# Patient Record
Sex: Female | Born: 1970 | Race: White | Hispanic: No | Marital: Single | Smoking: Former smoker
Health system: Southern US, Community
[De-identification: ages and names within clinical notes are randomized; demographics above are authoritative.]

## PROBLEM LIST (undated history)

## (undated) DIAGNOSIS — Z87442 Personal history of urinary calculi: Secondary | ICD-10-CM

## (undated) DIAGNOSIS — I10 Essential (primary) hypertension: Secondary | ICD-10-CM

## (undated) DIAGNOSIS — E7800 Pure hypercholesterolemia, unspecified: Secondary | ICD-10-CM

## (undated) DIAGNOSIS — F419 Anxiety disorder, unspecified: Secondary | ICD-10-CM

## (undated) DIAGNOSIS — R51 Headache: Secondary | ICD-10-CM

## (undated) DIAGNOSIS — E782 Mixed hyperlipidemia: Secondary | ICD-10-CM

## (undated) DIAGNOSIS — Z950 Presence of cardiac pacemaker: Secondary | ICD-10-CM

## (undated) DIAGNOSIS — I429 Cardiomyopathy, unspecified: Secondary | ICD-10-CM

## (undated) DIAGNOSIS — J449 Chronic obstructive pulmonary disease, unspecified: Secondary | ICD-10-CM

## (undated) DIAGNOSIS — I471 Supraventricular tachycardia: Secondary | ICD-10-CM

## (undated) DIAGNOSIS — K219 Gastro-esophageal reflux disease without esophagitis: Secondary | ICD-10-CM

## (undated) DIAGNOSIS — R0600 Dyspnea, unspecified: Secondary | ICD-10-CM

## (undated) DIAGNOSIS — G4730 Sleep apnea, unspecified: Secondary | ICD-10-CM

## (undated) DIAGNOSIS — E0590 Thyrotoxicosis, unspecified without thyrotoxic crisis or storm: Secondary | ICD-10-CM

## (undated) DIAGNOSIS — I4719 Other supraventricular tachycardia: Secondary | ICD-10-CM

## (undated) DIAGNOSIS — I4891 Unspecified atrial fibrillation: Secondary | ICD-10-CM

## (undated) DIAGNOSIS — Z9889 Other specified postprocedural states: Secondary | ICD-10-CM

## (undated) DIAGNOSIS — R55 Syncope and collapse: Secondary | ICD-10-CM

## (undated) DIAGNOSIS — J189 Pneumonia, unspecified organism: Secondary | ICD-10-CM

## (undated) DIAGNOSIS — I639 Cerebral infarction, unspecified: Secondary | ICD-10-CM

## (undated) DIAGNOSIS — R519 Headache, unspecified: Secondary | ICD-10-CM

## (undated) DIAGNOSIS — C801 Malignant (primary) neoplasm, unspecified: Secondary | ICD-10-CM

## (undated) DIAGNOSIS — I499 Cardiac arrhythmia, unspecified: Secondary | ICD-10-CM

## (undated) DIAGNOSIS — I495 Sick sinus syndrome: Secondary | ICD-10-CM

## (undated) DIAGNOSIS — IMO0001 Reserved for inherently not codable concepts without codable children: Secondary | ICD-10-CM

## (undated) DIAGNOSIS — R112 Nausea with vomiting, unspecified: Secondary | ICD-10-CM

## (undated) DIAGNOSIS — E039 Hypothyroidism, unspecified: Secondary | ICD-10-CM

## (undated) HISTORY — DX: Anxiety disorder, unspecified: F41.9

## (undated) HISTORY — DX: Supraventricular tachycardia: I47.1

## (undated) HISTORY — DX: Dyspnea, unspecified: R06.00

## (undated) HISTORY — DX: Cardiomyopathy, unspecified: I42.9

## (undated) HISTORY — PX: CARDIAC CATHETERIZATION: SHX172

## (undated) HISTORY — DX: Hypothyroidism, unspecified: E03.9

## (undated) HISTORY — DX: Other supraventricular tachycardia: I47.19

## (undated) HISTORY — DX: Mixed hyperlipidemia: E78.2

---

## 2006-03-29 HISTORY — PX: PACEMAKER INSERTION: SHX728

## 2011-03-30 HISTORY — PX: TUMOR REMOVAL: SHX12

## 2012-10-29 ENCOUNTER — Emergency Department (HOSPITAL_COMMUNITY): Payer: Medicaid Other

## 2012-10-29 ENCOUNTER — Encounter (HOSPITAL_COMMUNITY): Payer: Self-pay | Admitting: *Deleted

## 2012-10-29 ENCOUNTER — Emergency Department (HOSPITAL_COMMUNITY)
Admission: EM | Admit: 2012-10-29 | Discharge: 2012-10-29 | Discharge status: Against Medical Advice | Payer: Medicaid Other | Attending: Emergency Medicine | Admitting: Emergency Medicine

## 2012-10-29 DIAGNOSIS — R519 Headache, unspecified: Secondary | ICD-10-CM

## 2012-10-29 DIAGNOSIS — Z8673 Personal history of transient ischemic attack (TIA), and cerebral infarction without residual deficits: Secondary | ICD-10-CM | POA: Insufficient documentation

## 2012-10-29 DIAGNOSIS — F17200 Nicotine dependence, unspecified, uncomplicated: Secondary | ICD-10-CM | POA: Insufficient documentation

## 2012-10-29 DIAGNOSIS — Z950 Presence of cardiac pacemaker: Secondary | ICD-10-CM | POA: Insufficient documentation

## 2012-10-29 DIAGNOSIS — R51 Headache: Secondary | ICD-10-CM | POA: Insufficient documentation

## 2012-10-29 DIAGNOSIS — Z7982 Long term (current) use of aspirin: Secondary | ICD-10-CM | POA: Insufficient documentation

## 2012-10-29 HISTORY — DX: Presence of cardiac pacemaker: Z95.0

## 2012-10-29 HISTORY — DX: Cerebral infarction, unspecified: I63.9

## 2012-10-29 NOTE — ED Notes (Signed)
Pt c/o HA for a week off and on, this morning with seeing spots, + nausea, denies emesis

## 2012-10-29 NOTE — ED Notes (Signed)
Ct arrived for transport, pt not in room, gown in bathroom and pt nowhere to be found.

## 2012-10-29 NOTE — ED Provider Notes (Signed)
CSN: [id number]     Arrival date & time 10/29/12  1924 History     First MD Initiated Contact with Patient 10/29/12 2107     Chief Complaint  Patient presents with  . Headache    HPI Pt was seen at 2115.  Per pt, c/o gradual onset and persistence of constant headache for the past 1 week, worse since last night.  Describes the headache as "dull," located on both sides of her head.  Has been associated with nausea. Denies headache was sudden or maximal in onset or at any time.  Denies visual changes, no focal motor weakness, no tingling/numbness in extremities, no fevers, no neck pain, no rash.      Past Medical History  Diagnosis Date  . Pacemaker   . Stroke    Past Surgical History  Procedure Laterality Date  . Pacemaker insertion      History  Substance Use Topics  . Smoking status: Current Every Day Smoker    Types: Cigarettes  . Smokeless tobacco: Not on file  . Alcohol Use: No    Review of Systems ROS: Statement: All systems negative except as marked or noted in the HPI; Constitutional: Negative for fever and chills. ; ; Eyes: Negative for eye pain, redness and discharge. ; ; ENMT: Negative for ear pain, hoarseness, nasal congestion, sinus pressure and sore throat. ; ; Cardiovascular: Negative for chest pain, palpitations, diaphoresis, dyspnea and peripheral edema. ; ; Respiratory: Negative for cough, wheezing and stridor. ; ; Gastrointestinal: +nausea. Negative for vomiting, diarrhea, abdominal pain, blood in stool, hematemesis, jaundice and rectal bleeding. . ; ; Genitourinary: Negative for dysuria, flank pain and hematuria. ; ; Musculoskeletal: Negative for back pain and neck pain. Negative for swelling and trauma.; ; Skin: Negative for pruritus, rash, abrasions, blisters, bruising and skin lesion.; ; Neuro: +headache. Negative for lightheadedness and neck stiffness. Negative for weakness, altered level of consciousness , altered mental status, extremity weakness,  paresthesias, involuntary movement, seizure and syncope.     Allergies  Review of patient's allergies indicates no known allergies.  Home Medications   Current Outpatient Rx  Name  Route  Sig  Dispense  Refill  . acetaminophen (TYLENOL) 500 MG tablet   Oral   Take 1,000 mg by mouth every 6 (six) hours as needed for pain.         [name] aspirin 325 MG tablet   Oral   Take 325 mg by mouth daily.          BP 140/84  Pulse 100  Temp(Src) 98.4 F (36.9 C) (Oral)  Resp 20  Ht 5\' 5"  (1.651 m)  Wt 225 lb (102.059 kg)  BMI 37.44 kg/m2  SpO2 100%  LMP 10/16/2012 Physical Exam 2120: Physical examination:  Nursing notes reviewed; Vital signs and O2 SAT reviewed;  Constitutional: Well developed, Well nourished, Well hydrated, In no acute distress; Head:  Normocephalic, atraumatic; Eyes: EOMI, PERRL, No scleral icterus; ENMT: Mouth and pharynx normal, Mucous membranes moist; Neck: Supple, Full range of motion, No lymphadenopathy. No meningeal signs;; Cardiovascular: Regular rate and rhythm, No murmur, rub, or gallop; Respiratory: Breath sounds clear & equal bilaterally, No rales, rhonchi, wheezes.  Speaking full sentences with ease, Normal respiratory effort/excursion; Chest: Nontender, Movement normal; Abdomen: Soft, Nontender, Nondistended, Normal bowel sounds; Genitourinary: No CVA tenderness; Extremities: Pulses normal, No tenderness, No edema, No calf edema or asymmetry.; Neuro: AA&Ox3, Major CN grossly intact.  Strength 5/5 equal bilat UE's and LE's.  DTR 2/4 equal  bilat UE's and LE's.  No gross sensory deficits. Climbs on and off stretcher easily by herself. Gait steady. Speech clear.  No facial droop.  No nystagmus..; Skin: Color normal, Warm, Dry.   ED Course   Procedures    MDM  MDM Reviewed: nursing note and vitals   2130:  Pt no longer in exam room. Hospital gown on bed and pt's belongings gone. Not found in ED or waiting room. AWOL.    Laray Anger, DO 10/31/12  1258

## 2013-03-29 HISTORY — PX: HYSTEROSCOPY: SHX211

## 2013-05-20 ENCOUNTER — Encounter (HOSPITAL_COMMUNITY): Payer: Self-pay | Admitting: Emergency Medicine

## 2013-05-20 ENCOUNTER — Emergency Department (HOSPITAL_COMMUNITY)
Admission: EM | Admit: 2013-05-20 | Discharge: 2013-05-21 | Disposition: A | Discharge status: Home / Self Care | Payer: Medicaid Other | Attending: Emergency Medicine | Admitting: Emergency Medicine

## 2013-05-20 ENCOUNTER — Emergency Department (HOSPITAL_COMMUNITY): Payer: Medicaid Other

## 2013-05-20 DIAGNOSIS — Z950 Presence of cardiac pacemaker: Secondary | ICD-10-CM | POA: Insufficient documentation

## 2013-05-20 DIAGNOSIS — R079 Chest pain, unspecified: Secondary | ICD-10-CM | POA: Insufficient documentation

## 2013-05-20 DIAGNOSIS — Z862 Personal history of diseases of the blood and blood-forming organs and certain disorders involving the immune mechanism: Secondary | ICD-10-CM | POA: Insufficient documentation

## 2013-05-20 DIAGNOSIS — F17200 Nicotine dependence, unspecified, uncomplicated: Secondary | ICD-10-CM | POA: Insufficient documentation

## 2013-05-20 DIAGNOSIS — Z8673 Personal history of transient ischemic attack (TIA), and cerebral infarction without residual deficits: Secondary | ICD-10-CM | POA: Insufficient documentation

## 2013-05-20 DIAGNOSIS — Z8639 Personal history of other endocrine, nutritional and metabolic disease: Secondary | ICD-10-CM | POA: Insufficient documentation

## 2013-05-20 DIAGNOSIS — Z8679 Personal history of other diseases of the circulatory system: Secondary | ICD-10-CM | POA: Insufficient documentation

## 2013-05-20 HISTORY — DX: Syncope and collapse: R55

## 2013-05-20 HISTORY — DX: Pure hypercholesterolemia, unspecified: E78.00

## 2013-05-20 HISTORY — DX: Unspecified atrial fibrillation: I48.91

## 2013-05-20 LAB — CBC WITH DIFFERENTIAL/PLATELET
BASOS ABS: 0 10*3/uL (ref 0.0–0.1)
BASOS PCT: 1 % (ref 0–1)
EOS ABS: 0.2 10*3/uL (ref 0.0–0.7)
EOS PCT: 3 % (ref 0–5)
HEMATOCRIT: 35 % — AB (ref 36.0–46.0)
Hemoglobin: 11.7 g/dL — ABNORMAL LOW (ref 12.0–15.0)
LYMPHS PCT: 41 % (ref 12–46)
Lymphs Abs: 2.6 10*3/uL (ref 0.7–4.0)
MCH: 30.4 pg (ref 26.0–34.0)
MCHC: 33.4 g/dL (ref 30.0–36.0)
MCV: 90.9 fL (ref 78.0–100.0)
MONO ABS: 0.5 10*3/uL (ref 0.1–1.0)
Monocytes Relative: 9 % (ref 3–12)
Neutro Abs: 3 10*3/uL (ref 1.7–7.7)
Neutrophils Relative %: 48 % (ref 43–77)
PLATELETS: 307 10*3/uL (ref 150–400)
RBC: 3.85 MIL/uL — ABNORMAL LOW (ref 3.87–5.11)
RDW: 15.2 % (ref 11.5–15.5)
WBC: 6.4 10*3/uL (ref 4.0–10.5)

## 2013-05-20 LAB — I-STAT TROPONIN, ED
TROPONIN I, POC: 0.01 ng/mL (ref 0.00–0.08)
Troponin i, poc: 0 ng/mL (ref 0.00–0.08)

## 2013-05-20 LAB — BASIC METABOLIC PANEL
BUN: 15 mg/dL (ref 6–23)
CALCIUM: 8.9 mg/dL (ref 8.4–10.5)
CO2: 25 meq/L (ref 19–32)
CREATININE: 0.74 mg/dL (ref 0.50–1.10)
Chloride: 105 mEq/L (ref 96–112)
GFR calc Af Amer: >90 mL/min (ref >90)
Glucose, Bld: 121 mg/dL — ABNORMAL HIGH (ref 70–99)
Potassium: 3.7 mEq/L (ref 3.7–5.3)
Sodium: 142 mEq/L (ref 137–147)

## 2013-05-20 MED ORDER — ASPIRIN 325 MG PO TABS
325.0000 mg | ORAL_TABLET | Freq: Once | ORAL | Status: AC
  Administered 2013-05-20: 325 mg via ORAL
  Filled 2013-05-20: qty 1

## 2013-05-20 MED ORDER — NITROGLYCERIN 0.4 MG SL SUBL
0.4000 mg | SUBLINGUAL_TABLET | SUBLINGUAL | Status: DC | PRN
  Administered 2013-05-20: 0.4 mg via SUBLINGUAL
  Filled 2013-05-20: qty 25

## 2013-05-20 NOTE — ED Notes (Signed)
States her chest is getting better.  Rates a 3/10.

## 2013-05-20 NOTE — ED Notes (Addendum)
Pt states she feels ok now.  Heart rate in the 90's and nsr.  Pain is the same.  Dr. Stevie Kern notified

## 2013-05-20 NOTE — ED Provider Notes (Signed)
CSN: [id number]     Arrival date & time 05/20/13  1810 History   First MD Initiated Contact with Patient 05/20/13 1822   This chart was scribed for Alison Relic, MD by Terressa Koyanagi, ED Scribe and Jenne Campus, ED Scribe. This patient was seen in room APA17/APA17 and the patient's care was started at 7:29 PM.    Chief Complaint  Patient presents with  . Chest Pain    The history is provided by the patient. No language interpreter was used.   HPI Comments: Alison Wilkinson is a 43 y.o. female who presents to the Emergency Department complaining of chest pain onset a couple of hours ago. Pt reports that she was at rest when she began to experience diffuse, mild chest pressure without radiation. Pt states she did not experience any associated SOB, confusion or syncope. Pt denies history of asthma, emphysema, or blood clots. Pt has a pacemaker placed due to syncope and later diagnosed with a-fib.   No coughing, stabbing pain. No specific activity. Onset couple of hours ago. Gradual pressure, chest discomfort. Diffused, mild chest pressure without radiation, no SOB. No confusion, syncope. Has never experienced anything like this before.   Pt is a smoker. Pt denies a history of asthma, emphasema, or blood clots. Pt. orignially had the pacemaker for syncope. Baseline history of A-fib, pacemaker and stroke. Pt. Is supposed to be on Xarelto, but has not taken it in a month due to hemorrhagia. No DM.   Past Medical History  Diagnosis Date  . Pacemaker   . Stroke   . A-fib   . Syncope   . High cholesterol    Past Surgical History  Procedure Laterality Date  . Pacemaker insertion     History reviewed. No pertinent family history. History  Substance Use Topics  . Smoking status: Current Every Day Smoker    Types: Cigarettes  . Smokeless tobacco: Not on file  . Alcohol Use: No   No OB history provided.  Review of Systems  10 Systems reviewed and are negative for acute change except as  noted in the HPI.   Allergies  Review of patient's allergies indicates no known allergies.  Home Medications   No current outpatient prescriptions on file. BP 121/70  Pulse 97  Temp(Src) 97.6 F (36.4 C) (Oral)  Resp 20  Ht 5\' 5"  (1.651 m)  Wt 220 lb (99.791 kg)  BMI 36.61 kg/m2  SpO2 100%  LMP 05/17/2013 Physical Exam  Nursing note and vitals reviewed. Constitutional: She appears well-developed and well-nourished.  Awake, alert, nontoxic appearance.  HENT:  Head: Atraumatic.  Eyes: Right eye exhibits no discharge. Left eye exhibits no discharge.  Neck: Neck supple.  Cardiovascular: Normal rate and regular rhythm.   No murmur heard. Pulmonary/Chest: Effort normal. She has no wheezes. She exhibits no tenderness.  Abdominal: Soft. There is no tenderness. There is no rebound.  Musculoskeletal: She exhibits no edema and no tenderness.  Baseline ROM, no obvious new focal weakness.  Neurological:  Mental status and motor strength appears baseline for patient and situation.  Skin: Skin is warm and dry. No rash noted.  Psychiatric: She has a normal mood and affect.    ED Course  Procedures (including critical care time) PERC negative. HEART Pathway= 2. DIAGNOSTIC STUDIES: Oxygen Saturation is 100% on RA, normal by my interpretation.    COORDINATION OF CARE: Patient understand and agree with initial ED impression and plan with expectations set for ED visit.Pt feels improved after  observation and/or treatment in ED.Patient informed of clinical course, understand medical decision-making process, and agree with plan. Labs Review Labs Reviewed  CBC WITH DIFFERENTIAL - Abnormal; Notable for the following:    RBC 3.85 (*)    Hemoglobin 11.7 (*)    HCT 35.0 (*)    All other components within normal limits  BASIC METABOLIC PANEL - Abnormal; Notable for the following:    Glucose, Bld 121 (*)    All other components within normal limits  I-STAT TROPOININ, ED  I-STAT TROPOININ,  ED    EKG Interpretation    Date/Time:  Sunday May 20 2013 18:55:25 EST Ventricular Rate:  88 PR Interval:  166 QRS Duration: 88 QT Interval:  370 QTC Calculation: 447 R Axis:   19 Text Interpretation:  Normal sinus rhythm Normal ECG No previous ECGs available Confirmed by [facility]  MD, Ota Ebersole ([id number]) on 05/20/2013 7:29:44 PM            MDM   Final diagnoses:  Chest pain   I doubt any other EMC precluding discharge at this time including, but not necessarily limited to the following:AMI, PE. Feel low risk for ACS. I personally performed the services described in this documentation, which was scribed in my presence. The recorded information has been reviewed and is accurate.      Alison Relic, MD 05/22/13 2028

## 2013-05-20 NOTE — ED Notes (Signed)
Patient complaining of chest pain and shortness of breath starting approximately 2 hours ago.

## 2013-05-20 NOTE — ED Notes (Signed)
Pt states her chest pain has improved some.  Rates a 6/10 now.  Pt no distress. nsr on monitor.

## 2013-05-20 NOTE — ED Notes (Signed)
After taking nitroglycerin pt had short period of sinus tachycardia.  Rate 160's/  Pt states her pain is about the same but she does not feel good.  Pt trying to get up out of bed.

## 2013-05-21 NOTE — ED Notes (Signed)
Pt alert & oriented x4, stable gait. Patient given discharge instructions, paperwork & prescription(s). Patient  instructed to stop at the registration desk to finish any additional paperwork. Patient verbalized understanding. Pt left department w/ no further questions. 

## 2013-05-21 NOTE — Discharge Instructions (Signed)

## 2013-05-25 LAB — HM MAMMOGRAPHY: HM MAMMO: NEGATIVE

## 2013-09-25 ENCOUNTER — Emergency Department (HOSPITAL_COMMUNITY)
Admission: EM | Admit: 2013-09-25 | Discharge: 2013-09-25 | Disposition: A | Discharge status: Home / Self Care | Payer: Medicaid Other | Attending: Emergency Medicine | Admitting: Emergency Medicine

## 2013-09-25 ENCOUNTER — Encounter (HOSPITAL_COMMUNITY): Payer: Self-pay | Admitting: Emergency Medicine

## 2013-09-25 DIAGNOSIS — Z950 Presence of cardiac pacemaker: Secondary | ICD-10-CM | POA: Insufficient documentation

## 2013-09-25 DIAGNOSIS — G43009 Migraine without aura, not intractable, without status migrainosus: Secondary | ICD-10-CM

## 2013-09-25 DIAGNOSIS — Z8679 Personal history of other diseases of the circulatory system: Secondary | ICD-10-CM | POA: Insufficient documentation

## 2013-09-25 DIAGNOSIS — Z862 Personal history of diseases of the blood and blood-forming organs and certain disorders involving the immune mechanism: Secondary | ICD-10-CM | POA: Insufficient documentation

## 2013-09-25 DIAGNOSIS — Z8673 Personal history of transient ischemic attack (TIA), and cerebral infarction without residual deficits: Secondary | ICD-10-CM | POA: Insufficient documentation

## 2013-09-25 DIAGNOSIS — M79609 Pain in unspecified limb: Secondary | ICD-10-CM | POA: Insufficient documentation

## 2013-09-25 DIAGNOSIS — Z8639 Personal history of other endocrine, nutritional and metabolic disease: Secondary | ICD-10-CM | POA: Insufficient documentation

## 2013-09-25 DIAGNOSIS — F17200 Nicotine dependence, unspecified, uncomplicated: Secondary | ICD-10-CM | POA: Insufficient documentation

## 2013-09-25 DIAGNOSIS — G43909 Migraine, unspecified, not intractable, without status migrainosus: Secondary | ICD-10-CM | POA: Insufficient documentation

## 2013-09-25 LAB — CBC WITH DIFFERENTIAL/PLATELET
Basophils Absolute: 0 10*3/uL (ref 0.0–0.1)
Basophils Relative: 0 % (ref 0–1)
Eosinophils Absolute: 0.1 10*3/uL (ref 0.0–0.7)
Eosinophils Relative: 1 % (ref 0–5)
HEMATOCRIT: 44 % (ref 36.0–46.0)
HEMOGLOBIN: 15.1 g/dL — AB (ref 12.0–15.0)
LYMPHS PCT: 38 % (ref 12–46)
Lymphs Abs: 2.7 10*3/uL (ref 0.7–4.0)
MCH: 30.8 pg (ref 26.0–34.0)
MCHC: 34.3 g/dL (ref 30.0–36.0)
MCV: 89.6 fL (ref 78.0–100.0)
MONO ABS: 0.6 10*3/uL (ref 0.1–1.0)
MONOS PCT: 9 % (ref 3–12)
NEUTROS ABS: 3.7 10*3/uL (ref 1.7–7.7)
Neutrophils Relative %: 52 % (ref 43–77)
Platelets: 285 10*3/uL (ref 150–400)
RBC: 4.91 MIL/uL (ref 3.87–5.11)
RDW: 14 % (ref 11.5–15.5)
WBC: 7.1 10*3/uL (ref 4.0–10.5)

## 2013-09-25 LAB — URINALYSIS, ROUTINE W REFLEX MICROSCOPIC
BILIRUBIN URINE: NEGATIVE
GLUCOSE, UA: NEGATIVE mg/dL
HGB URINE DIPSTICK: NEGATIVE
KETONES UR: 15 mg/dL — AB
Leukocytes, UA: NEGATIVE
Nitrite: NEGATIVE
PH: 5.5 (ref 5.0–8.0)
Protein, ur: NEGATIVE mg/dL
Specific Gravity, Urine: >1.03 — ABNORMAL HIGH (ref 1.005–1.030)
Urobilinogen, UA: 0.2 mg/dL (ref 0.0–1.0)

## 2013-09-25 LAB — BASIC METABOLIC PANEL
BUN: 15 mg/dL (ref 6–23)
CHLORIDE: 103 meq/L (ref 96–112)
CO2: 24 meq/L (ref 19–32)
CREATININE: 0.64 mg/dL (ref 0.50–1.10)
Calcium: 9.3 mg/dL (ref 8.4–10.5)
GFR calc Af Amer: >90 mL/min (ref >90)
GFR calc non Af Amer: >90 mL/min (ref >90)
Glucose, Bld: 105 mg/dL — ABNORMAL HIGH (ref 70–99)
Potassium: 3.8 mEq/L (ref 3.7–5.3)
Sodium: 141 mEq/L (ref 137–147)

## 2013-09-25 LAB — CBG MONITORING, ED: GLUCOSE-CAPILLARY: 110 mg/dL — AB (ref 70–99)

## 2013-09-25 MED ORDER — DIPHENHYDRAMINE HCL 50 MG/ML IJ SOLN
12.5000 mg | Freq: Once | INTRAMUSCULAR | Status: AC
  Administered 2013-09-25: 12.5 mg via INTRAVENOUS
  Filled 2013-09-25: qty 1

## 2013-09-25 MED ORDER — METOCLOPRAMIDE HCL 5 MG/ML IJ SOLN
10.0000 mg | Freq: Once | INTRAMUSCULAR | Status: AC
  Administered 2013-09-25: 10 mg via INTRAVENOUS
  Filled 2013-09-25: qty 2

## 2013-09-25 MED ORDER — SODIUM CHLORIDE 0.9 % IV BOLUS (SEPSIS)
1000.0000 mL | Freq: Once | INTRAVENOUS | Status: AC
  Administered 2013-09-25: 1000 mL via INTRAVENOUS

## 2013-09-25 MED ORDER — BUTALBITAL-APAP-CAFFEINE 50-325-40 MG PO TABS: 1.0000 | ORAL_TABLET | Freq: Four times a day (QID) | ORAL | Status: AC | PRN

## 2013-09-25 MED ORDER — KETOROLAC TROMETHAMINE 30 MG/ML IJ SOLN
30.0000 mg | Freq: Once | INTRAMUSCULAR | Status: AC
  Administered 2013-09-25: 30 mg via INTRAVENOUS
  Filled 2013-09-25: qty 1

## 2013-09-25 MED ORDER — LORAZEPAM 2 MG/ML IJ SOLN
1.0000 mg | Freq: Once | INTRAMUSCULAR | Status: AC
  Administered 2013-09-25: 1 mg via INTRAVENOUS
  Filled 2013-09-25: qty 1

## 2013-09-25 NOTE — ED Notes (Signed)
Intermittent headaches times 3 days, with nausea and blurred vision. Denies vomiting.

## 2013-09-25 NOTE — ED Notes (Signed)
Pt given ice water; per EDP no swallow screen needed.

## 2013-09-25 NOTE — ED Provider Notes (Signed)
TIME SEEN: 4:55 PM  CHIEF COMPLAINT: Headaches, nausea, right eye blurry vision, right arm and leg pain  HPI: Patient is a 43 year old female with history of atrial fibrillation who is supposed to be taking Xarelto but states she's not taking his medication because she is concerned about side effects, prior stroke, hyperlipidemia, pacemaker, migraine headaches who presents to the emergency department with diffuse, throbbing headache with associated nausea that is similar to her prior migraines. She states she's been taking Excedrin at home without relief. She states she was concerned however he decided to come to the emergency department because she is also having a aching pain in her right arm and leg with no aggravating or relieving factors and blurry vision in her right eye. She describes seeing spots in her right eye intermittently. She denies any pain. No eye trauma. Her vision is normal in the left eye. She does not wear contacts or glasses. She denies any numbness or focal weakness. No neck or back pain. No bowel or bladder incontinence. No chest pain or shortness of breath. No fevers, chills. No cough, vomiting or diarrhea. No bloody stools or melena. She states she feels weak all over and "I just don't feel like myself". She states that she does not feel that she is having another stroke.  ROS: See HPI Constitutional: no fever  Eyes: no drainage  ENT: no runny nose   Cardiovascular:  no chest pain  Resp: no SOB  GI: no vomiting GU: no dysuria Integumentary: no rash  Allergy: no hives  Musculoskeletal: no leg swelling  Neurological: no slurred speech ROS otherwise negative  PAST MEDICAL HISTORY/PAST SURGICAL HISTORY:  Past Medical History  Diagnosis Date  . Pacemaker   . Stroke   . A-fib   . Syncope   . High cholesterol     MEDICATIONS:  Prior to Admission medications   Not on File    ALLERGIES:  No Known Allergies  SOCIAL HISTORY:  History  Substance Use Topics  .  Smoking status: Current Every Day Smoker    Types: Cigarettes  . Smokeless tobacco: Not on file  . Alcohol Use: No    FAMILY HISTORY: History reviewed. No pertinent family history.  EXAM: BP 137/81  Pulse 125  Temp(Src) 98.2 F (36.8 C) (Oral)  Resp 18  Ht 5\' 4"  (1.626 m)  Wt 240 lb (108.863 kg)  BMI 41.18 kg/m2  SpO2 100% CONSTITUTIONAL: Alert and oriented and responds appropriately to questions. Well-appearing; well-nourished, nontoxic, well-appearing man in distress HEAD: Normocephalic EYES: Conjunctivae clear, PERRL, extraocular movements intact and painless, no eye discharge, no periorbital ecchymosis or swelling, no pain with consensual light response, no photophobia, no hyphema or hypopyon; right eye vision 20/40, left eye vision 20/30 ENT: normal nose; no rhinorrhea; moist mucous membranes; pharynx without lesions noted NECK: Supple, no meningismus, no LAD; no midline spinal tenderness or step-off or deformity CARD: RRR; S1 and S2 appreciated; no murmurs, no clicks, no rubs, no gallops RESP: Normal chest excursion without splinting or tachypnea; breath sounds clear and equal bilaterally; no wheezes, no rhonchi, no rales,  ABD/GI: Normal bowel sounds; non-distended; soft, non-tender, no rebound, no guarding BACK:  The back appears normal and is non-tender to palpation, there is no CVA tenderness; no midline spinal tenderness or step-off or deformity EXT: Patient reports she is mildly tender to palpation when I palpate her right upper extremity and right lower extremity diffusely with no obvious bony deformity or signs of septic arthritis, no erythema or  warmth or induration, no lesions, 2+ radial and DP pulses bilaterally, Normal ROM in all joints; otherwise extremities are non-tender to palpation; no edema; normal capillary refill; no cyanosis    SKIN: Normal color for age and race; warm, no rash  NEURO: Moves all extremities equally; strength 5/5 in all 4 extremities, sensation  to light touch intact diffusely, cranial nerves II through XII intact, patient does have some blurry vision and difficulty with telling me how many fingers I am holding up only in her right eye in the medial lower quadrant but no vision loss, no dysmetria to finger-nose testing, normal gait, 2+ deep tendon reflexes in bilateral upper and lower extremity, no clonus PSYCH: The patient's mood and manner are appropriate. Grooming and personal hygiene are appropriate.  MEDICAL DECISION MAKING: Patient here with diffuse headache that she describes as feeling similar to her prior migraines with associated nausea. She is also complaining of right arm and leg pain that is new for her denies any injury. No new numbness or weakness. She is also complaining of some right eye blurry vision that has been intermittent for the past 3 days. No sudden onset headache, thunderclap headache, worst headache of her life. No infectious symptoms. No history of head injury. She is neurologically intact other than having some blurry vision in her right eye in the right medial lower corner and. No diplopia. No vision loss. She is otherwise well-appearing. Symptoms may be secondary to her migraine. Doubt stroke given this is unilateral. No vision loss to suggest retinal artery occlusion. No history of eye trauma or eye pain to suggest abrasion, foreign body, globe injury. Possible retinal detachment but atypical given symptoms are intermittent. We'll obtain basic labs, urine, CBG, EKG. We'll give Toradol, Reglan, Benadryl and IV fluids and reassess. I do not feel head CT is indicated at this time.   Patient began feeling like she was going to have a panic attack. She was given 1 mg of IV Ativan.  ED PROGRESS: Patient's heart rate has improved with medications. She states her headache is almost completely resolved as has her arm, leg pain and blurry vision. Patient feels that she could manage her symptoms at home. She does not want any  further medications in the ED. I feel her symptoms are all likely related to a migraine headache. Her labs here have been unremarkable. Have discussed with patient that if her symptoms continue, recommend she followup with a neurologist. We'll discharge him with prescription for Fioricet to take as needed. Have discussed strict return precautions and supportive care instructions. Patient verbalizes understanding and is comfortable with this plan.     EKG Interpretation  Date/Time:  Tuesday September 25 2013 17:24:11 EDT Ventricular Rate:  100 PR Interval:  126 QRS Duration: 86 QT Interval:  342 QTC Calculation: 441 R Axis:   75 Text Interpretation:  Sinus tachycardia Confirmed by WARD,  DO, KRISTEN [phone]) on 09/25/2013 5:39:31 PM        [place], DO 09/25/13 1821

## 2013-09-25 NOTE — ED Notes (Signed)
Pt reports "it's like my vision is blocked in my right eye in one section." Pt reports taking excedrin migraine with no pain relief. Dr. Leonides Schanz at bedside.

## 2013-09-25 NOTE — Discharge Instructions (Signed)

## 2014-03-24 ENCOUNTER — Emergency Department (HOSPITAL_COMMUNITY)
Admission: EM | Admit: 2014-03-24 | Discharge: 2014-03-24 | Disposition: A | Discharge status: Home / Self Care | Payer: Medicaid Other | Attending: Emergency Medicine | Admitting: Emergency Medicine

## 2014-03-24 ENCOUNTER — Encounter (HOSPITAL_COMMUNITY): Payer: Self-pay | Admitting: Emergency Medicine

## 2014-03-24 ENCOUNTER — Emergency Department (HOSPITAL_COMMUNITY): Payer: Medicaid Other

## 2014-03-24 DIAGNOSIS — Z8639 Personal history of other endocrine, nutritional and metabolic disease: Secondary | ICD-10-CM | POA: Insufficient documentation

## 2014-03-24 DIAGNOSIS — R531 Weakness: Secondary | ICD-10-CM | POA: Diagnosis not present

## 2014-03-24 DIAGNOSIS — Z720 Tobacco use: Secondary | ICD-10-CM | POA: Diagnosis not present

## 2014-03-24 DIAGNOSIS — I4891 Unspecified atrial fibrillation: Secondary | ICD-10-CM | POA: Insufficient documentation

## 2014-03-24 DIAGNOSIS — Z950 Presence of cardiac pacemaker: Secondary | ICD-10-CM | POA: Diagnosis not present

## 2014-03-24 DIAGNOSIS — Z8673 Personal history of transient ischemic attack (TIA), and cerebral infarction without residual deficits: Secondary | ICD-10-CM | POA: Insufficient documentation

## 2014-03-24 DIAGNOSIS — R52 Pain, unspecified: Secondary | ICD-10-CM

## 2014-03-24 DIAGNOSIS — R51 Headache: Secondary | ICD-10-CM | POA: Insufficient documentation

## 2014-03-24 DIAGNOSIS — R5383 Other fatigue: Secondary | ICD-10-CM | POA: Diagnosis present

## 2014-03-24 LAB — COMPREHENSIVE METABOLIC PANEL
ALT: 27 U/L (ref 0–35)
AST: 23 U/L (ref 0–37)
Albumin: 3.6 g/dL (ref 3.5–5.2)
Alkaline Phosphatase: 72 U/L (ref 39–117)
Anion gap: 4 — ABNORMAL LOW (ref 5–15)
BUN: 16 mg/dL (ref 6–23)
CALCIUM: 9.5 mg/dL (ref 8.4–10.5)
CO2: 26 mmol/L (ref 19–32)
Chloride: 105 mEq/L (ref 96–112)
Creatinine, Ser: 0.55 mg/dL (ref 0.50–1.10)
GLUCOSE: 101 mg/dL — AB (ref 70–99)
Potassium: 4.5 mmol/L (ref 3.5–5.1)
Sodium: 135 mmol/L (ref 135–145)
TOTAL PROTEIN: 6.8 g/dL (ref 6.0–8.3)
Total Bilirubin: 0.2 mg/dL — ABNORMAL LOW (ref 0.3–1.2)

## 2014-03-24 LAB — URINALYSIS, ROUTINE W REFLEX MICROSCOPIC
Bilirubin Urine: NEGATIVE
Glucose, UA: NEGATIVE mg/dL
Hgb urine dipstick: NEGATIVE
Ketones, ur: NEGATIVE mg/dL
LEUKOCYTES UA: NEGATIVE
Nitrite: NEGATIVE
Protein, ur: NEGATIVE mg/dL
SPECIFIC GRAVITY, URINE: 1.02 (ref 1.005–1.030)
UROBILINOGEN UA: 0.2 mg/dL (ref 0.0–1.0)
pH: 6.5 (ref 5.0–8.0)

## 2014-03-24 LAB — CBC WITH DIFFERENTIAL/PLATELET
Basophils Absolute: 0 10*3/uL (ref 0.0–0.1)
Basophils Relative: 0 % (ref 0–1)
EOS ABS: 0.1 10*3/uL (ref 0.0–0.7)
EOS PCT: 1 % (ref 0–5)
HEMATOCRIT: 43.6 % (ref 36.0–46.0)
Hemoglobin: 14.7 g/dL (ref 12.0–15.0)
LYMPHS ABS: 2 10*3/uL (ref 0.7–4.0)
LYMPHS PCT: 43 % (ref 12–46)
MCH: 29.6 pg (ref 26.0–34.0)
MCHC: 33.7 g/dL (ref 30.0–36.0)
MCV: 87.9 fL (ref 78.0–100.0)
MONO ABS: 0.4 10*3/uL (ref 0.1–1.0)
Monocytes Relative: 8 % (ref 3–12)
Neutro Abs: 2.3 10*3/uL (ref 1.7–7.7)
Neutrophils Relative %: 48 % (ref 43–77)
Platelets: 247 10*3/uL (ref 150–400)
RBC: 4.96 MIL/uL (ref 3.87–5.11)
RDW: 13.3 % (ref 11.5–15.5)
WBC: 4.8 10*3/uL (ref 4.0–10.5)

## 2014-03-24 MED ORDER — ACETAMINOPHEN 325 MG PO TABS
650.0000 mg | ORAL_TABLET | Freq: Once | ORAL | Status: AC
  Administered 2014-03-24: 650 mg via ORAL
  Filled 2014-03-24: qty 2

## 2014-03-24 NOTE — ED Notes (Signed)
PT reports extreme fatigue and muscle weakness worsening over the past 6 months and pt reports falling asleep often even sitting up x3 days. PT states she feels disoriented and her thoughts are not clear and reports a headache today.

## 2014-03-24 NOTE — ED Notes (Signed)
Called lab asking for blood work to be drawn, a fast track room is being held for this purpose.

## 2014-03-24 NOTE — Discharge Instructions (Signed)
Follow up with your md this week. °

## 2014-03-24 NOTE — ED Notes (Signed)
Patient reports fatigue, decreased vision, muscle weakness and intention tremor that is also slightly  present at rest.  States she falls asleep easily throughout day, but sleeps well w/some mild difficulty falling asleep.  States he thought processes are slowed and has to have others help keep up with things at home.  Denies chest pain, SOB, dizziness.

## 2014-03-24 NOTE — ED Notes (Signed)
Patient with no complaints at this time. Respirations even and unlabored. Skin warm/dry. Discharge instructions reviewed with patient at this time. Patient given opportunity to voice concerns/ask questions. IV removed per policy and band-aid applied to site. Patient discharged at this time and left Emergency Department with steady gait.  

## 2014-03-24 NOTE — ED Provider Notes (Signed)
CSN: [id number]     Arrival date & time 03/24/14  1641 History   First MD Initiated Contact with Patient 03/24/14 1948     Chief Complaint  Patient presents with  . Fatigue     (Consider location/radiation/quality/duration/timing/severity/associated sxs/prior Treatment) Patient is a 43 y.o. female presenting with weakness. The history is provided by the patient (the pt complains of weakness and memory problems for a few weaks).  Weakness This is a new problem. The current episode started more than 2 days ago. The problem occurs constantly. The problem has not changed since onset.Pertinent negatives include no chest pain, no abdominal pain and no headaches. Nothing aggravates the symptoms. Nothing relieves the symptoms.    Past Medical History  Diagnosis Date  . Pacemaker   . Stroke   . A-fib   . Syncope   . High cholesterol    Past Surgical History  Procedure Laterality Date  . Pacemaker insertion     No family history on file. History  Substance Use Topics  . Smoking status: Current Every Day Smoker -- 1.00 packs/day    Types: Cigarettes  . Smokeless tobacco: Not on file  . Alcohol Use: No   OB History    No data available     Review of Systems  Constitutional: Negative for appetite change and fatigue.  HENT: Negative for congestion, ear discharge and sinus pressure.   Eyes: Negative for discharge.  Respiratory: Negative for cough.   Cardiovascular: Negative for chest pain.  Gastrointestinal: Negative for abdominal pain and diarrhea.  Genitourinary: Negative for frequency and hematuria.  Musculoskeletal: Negative for back pain.  Skin: Negative for rash.  Neurological: Positive for weakness. Negative for seizures and headaches.  Psychiatric/Behavioral: Negative for hallucinations.      Allergies  Review of patient's allergies indicates no known allergies.  Home Medications   Prior to Admission medications   Medication Sig Start Date End Date Taking?  Authorizing Provider  aspirin-acetaminophen-caffeine (EXCEDRIN MIGRAINE) [phone] MG per tablet Take 2 tablets by mouth daily as needed for headache or migraine.    Historical Provider, MD  butalbital-acetaminophen-caffeine (FIORICET) 50-325-40 MG per tablet Take 1-2 tablets by mouth every 6 (six) hours as needed for headache. Patient not taking: Reported on 03/24/2014 09/25/13 09/25/14  Kristen N Ward, DO   BP 159/84 mmHg  Pulse 111  Temp(Src) 97.8 F (36.6 C) (Oral)  Resp 22  Ht 5\' 5"  (1.651 m)  Wt 210 lb (95.255 kg)  BMI 34.95 kg/m2  SpO2 100%  LMP 03/21/2014 Physical Exam  Constitutional: She is oriented to person, place, and time. She appears well-developed.  HENT:  Head: Normocephalic.  Eyes: Conjunctivae and EOM are normal. No scleral icterus.  Neck: Neck supple. No thyromegaly present.  Cardiovascular: Normal rate and regular rhythm.  Exam reveals no gallop and no friction rub.   No murmur heard. Pulmonary/Chest: No stridor. She has no wheezes. She has no rales. She exhibits no tenderness.  Abdominal: She exhibits no distension. There is no tenderness. There is no rebound.  Musculoskeletal: Normal range of motion. She exhibits no edema.  Lymphadenopathy:    She has no cervical adenopathy.  Neurological: She is oriented to person, place, and time. She exhibits normal muscle tone. Coordination normal.  Skin: No rash noted. No erythema.  Psychiatric: She has a normal mood and affect. Her behavior is normal.    ED Course  Procedures (including critical care time) Labs Review Labs Reviewed  COMPREHENSIVE METABOLIC PANEL - Abnormal; Notable for  the following:    Glucose, Bld 101 (*)    Total Bilirubin 0.2 (*)    Anion gap 4 (*)    All other components within normal limits  CBC WITH DIFFERENTIAL  URINALYSIS, ROUTINE W REFLEX MICROSCOPIC  TSH    Imaging Review Ct Head Wo Contrast  03/24/2014   CLINICAL DATA:  Fatigue and muscle weakness for 6 months. Somnolence for 3  days. Headache.  EXAM: CT HEAD WITHOUT CONTRAST  TECHNIQUE: Contiguous axial images were obtained from the base of the skull through the vertex without intravenous contrast.  COMPARISON:  Head CT scan 12/18/2012.  FINDINGS: The brain appears normal without hemorrhage, infarct, mass lesion, mass effect, midline shift or abnormal extra-axial fluid collection. No hydrocephalus or pneumocephalus. The calvarium is intact.  IMPRESSION: Negative head CT.   Electronically Signed   By: Inge Rise M.D.   On: 03/24/2014 20:28     EKG Interpretation None      MDM   Final diagnoses:  Pain  Weakness    Weakness and memory problems.   Nl studies,  Will get tsh and have pt follow up with pcp    Maudry Diego, MD 03/24/14 2116

## 2014-03-25 LAB — TSH: TSH: 0.018 u[IU]/mL — AB (ref 0.350–4.500)

## 2014-03-28 ENCOUNTER — Telehealth (HOSPITAL_BASED_OUTPATIENT_CLINIC_OR_DEPARTMENT_OTHER): Payer: Self-pay | Admitting: *Deleted

## 2014-05-23 ENCOUNTER — Telehealth: Payer: Self-pay | Admitting: Family Medicine

## 2014-05-28 DIAGNOSIS — I10 Essential (primary) hypertension: Secondary | ICD-10-CM | POA: Insufficient documentation

## 2014-06-04 NOTE — Telephone Encounter (Signed)
Left detailed message stating multiple attempts have been made to contact pt and will close encounter, if pt still needs an appt to call back.

## 2014-10-23 ENCOUNTER — Encounter (HOSPITAL_COMMUNITY): Payer: Self-pay | Admitting: Emergency Medicine

## 2014-10-23 ENCOUNTER — Emergency Department (HOSPITAL_COMMUNITY)
Admission: EM | Admit: 2014-10-23 | Discharge: 2014-10-23 | Disposition: A | Discharge status: Home / Self Care | Payer: Medicaid Other | Attending: Emergency Medicine | Admitting: Emergency Medicine

## 2014-10-23 DIAGNOSIS — Z79899 Other long term (current) drug therapy: Secondary | ICD-10-CM | POA: Insufficient documentation

## 2014-10-23 DIAGNOSIS — Z720 Tobacco use: Secondary | ICD-10-CM | POA: Insufficient documentation

## 2014-10-23 DIAGNOSIS — J3489 Other specified disorders of nose and nasal sinuses: Secondary | ICD-10-CM | POA: Diagnosis not present

## 2014-10-23 DIAGNOSIS — Z8679 Personal history of other diseases of the circulatory system: Secondary | ICD-10-CM | POA: Insufficient documentation

## 2014-10-23 DIAGNOSIS — H81399 Other peripheral vertigo, unspecified ear: Secondary | ICD-10-CM | POA: Insufficient documentation

## 2014-10-23 DIAGNOSIS — Z950 Presence of cardiac pacemaker: Secondary | ICD-10-CM | POA: Diagnosis not present

## 2014-10-23 DIAGNOSIS — Z8639 Personal history of other endocrine, nutritional and metabolic disease: Secondary | ICD-10-CM | POA: Diagnosis not present

## 2014-10-23 DIAGNOSIS — R531 Weakness: Secondary | ICD-10-CM | POA: Diagnosis present

## 2014-10-23 DIAGNOSIS — Z8673 Personal history of transient ischemic attack (TIA), and cerebral infarction without residual deficits: Secondary | ICD-10-CM | POA: Insufficient documentation

## 2014-10-23 HISTORY — DX: Headache: R51

## 2014-10-23 HISTORY — DX: Sick sinus syndrome: I49.5

## 2014-10-23 HISTORY — DX: Headache, unspecified: R51.9

## 2014-10-23 LAB — COMPREHENSIVE METABOLIC PANEL
ALT: 19 U/L (ref 14–54)
AST: 19 U/L (ref 15–41)
Albumin: 3.6 g/dL (ref 3.5–5.0)
Alkaline Phosphatase: 115 U/L (ref 38–126)
Anion gap: 4 — ABNORMAL LOW (ref 5–15)
BUN: 13 mg/dL (ref 6–20)
CHLORIDE: 108 mmol/L (ref 101–111)
CO2: 28 mmol/L (ref 22–32)
Calcium: 8.9 mg/dL (ref 8.9–10.3)
Creatinine, Ser: 0.57 mg/dL (ref 0.44–1.00)
GFR calc Af Amer: >60 mL/min (ref >60)
GLUCOSE: 128 mg/dL — AB (ref 65–99)
Potassium: 3.9 mmol/L (ref 3.5–5.1)
SODIUM: 140 mmol/L (ref 135–145)
Total Bilirubin: 0.6 mg/dL (ref 0.3–1.2)
Total Protein: 7.1 g/dL (ref 6.5–8.1)

## 2014-10-23 LAB — URINALYSIS, ROUTINE W REFLEX MICROSCOPIC
Bilirubin Urine: NEGATIVE
Glucose, UA: NEGATIVE mg/dL
HGB URINE DIPSTICK: NEGATIVE
Ketones, ur: NEGATIVE mg/dL
LEUKOCYTES UA: NEGATIVE
Nitrite: NEGATIVE
PROTEIN: NEGATIVE mg/dL
Specific Gravity, Urine: 1.015 (ref 1.005–1.030)
UROBILINOGEN UA: 1 mg/dL (ref 0.0–1.0)
pH: 7 (ref 5.0–8.0)

## 2014-10-23 LAB — CBC WITH DIFFERENTIAL/PLATELET
Basophils Absolute: 0 10*3/uL (ref 0.0–0.1)
Basophils Relative: 0 % (ref 0–1)
Eosinophils Absolute: 0.2 10*3/uL (ref 0.0–0.7)
Eosinophils Relative: 2 % (ref 0–5)
HCT: 40.2 % (ref 36.0–46.0)
Hemoglobin: 13.8 g/dL (ref 12.0–15.0)
Lymphocytes Relative: 36 % (ref 12–46)
Lymphs Abs: 2.9 10*3/uL (ref 0.7–4.0)
MCH: 30.1 pg (ref 26.0–34.0)
MCHC: 34.3 g/dL (ref 30.0–36.0)
MCV: 87.8 fL (ref 78.0–100.0)
Monocytes Absolute: 0.8 10*3/uL (ref 0.1–1.0)
Monocytes Relative: 10 % (ref 3–12)
Neutro Abs: 4.1 10*3/uL (ref 1.7–7.7)
Neutrophils Relative %: 52 % (ref 43–77)
PLATELETS: 258 10*3/uL (ref 150–400)
RBC: 4.58 MIL/uL (ref 3.87–5.11)
RDW: 13.8 % (ref 11.5–15.5)
WBC: 7.9 10*3/uL (ref 4.0–10.5)

## 2014-10-23 MED ORDER — SODIUM CHLORIDE 0.9 % IV SOLN
INTRAVENOUS | Status: DC
  Administered 2014-10-23: 21:00:00 via INTRAVENOUS

## 2014-10-23 MED ORDER — MECLIZINE HCL 25 MG PO TABS: 25.0000 mg | ORAL_TABLET | Freq: Three times a day (TID) | ORAL | Status: DC | PRN

## 2014-10-23 MED ORDER — MECLIZINE HCL 12.5 MG PO TABS
25.0000 mg | ORAL_TABLET | Freq: Once | ORAL | Status: AC
  Administered 2014-10-23: 25 mg via ORAL
  Filled 2014-10-23: qty 2

## 2014-10-23 NOTE — ED Provider Notes (Signed)
CSN: [id number]     Arrival date & time 10/23/14  1937 History   First MD Initiated Contact with Patient 10/23/14 2002     Chief Complaint  Patient presents with  . Weakness      HPI Pt was seen at 2010. Per pt, c/o sudden onset and persistence of constant "feeling like everything is moving" that began several hours ago. Has been associated with generalized weakness/fatigue. Describes her symptoms as "it feels like I'm high on drugs but I don't do drugs anymore."  Denies eye pain, no visual changes, no focal motor weakness, no tingling/numbness in extremities, no ataxia, no slurred speech, no facial droop, no CP/palpitations, no SOB/cough, no abd pain, no N/V/D, no headache.     Past Medical History  Diagnosis Date  . Pacemaker   . Stroke   . A-fib   . Syncope   . High cholesterol   . Headache   . SSS (sick sinus syndrome)    Past Surgical History  Procedure Laterality Date  . Pacemaker insertion      History  Substance Use Topics  . Smoking status: Current Every Day Smoker -- 1.00 packs/day    Types: Cigarettes  . Smokeless tobacco: Not on file  . Alcohol Use: No    Review of Systems ROS: Statement: All systems negative except as marked or noted in the HPI; Constitutional: Negative for fever and chills. +generalized weakness.; ; Eyes: Negative for eye pain, redness and discharge. ; ; ENMT: Negative for ear pain, hoarseness, nasal congestion, sinus pressure and sore throat. ; ; Cardiovascular: Negative for chest pain, palpitations, diaphoresis, dyspnea and peripheral edema. ; ; Respiratory: Negative for cough, wheezing and stridor. ; ; Gastrointestinal: Negative for nausea, vomiting, diarrhea, abdominal pain, blood in stool, hematemesis, jaundice and rectal bleeding. . ; ; Genitourinary: Negative for dysuria, flank pain and hematuria. ; ; Musculoskeletal: Negative for back pain and neck pain. Negative for swelling and trauma.; ; Skin: Negative for pruritus, rash, abrasions,  blisters, bruising and skin lesion.; ; Neuro: +"everything is moving." Negative for headache, lightheadedness and neck stiffness. Negative for altered level of consciousness , altered mental status, extremity weakness, paresthesias, involuntary movement, seizure and syncope.      Allergies  Review of patient's allergies indicates no known allergies.  Home Medications   Prior to Admission medications   Medication Sig Start Date End Date Taking? Authorizing Provider  clonazePAM (KLONOPIN) 0.5 MG tablet Take 0.5 mg by mouth 2 (two) times daily as needed for anxiety.    Yes Historical Provider, MD  ibuprofen (ADVIL,MOTRIN) 200 MG tablet Take 600 mg by mouth every 6 (six) hours as needed for mild pain or moderate pain.    Yes Historical Provider, MD  metoprolol tartrate (LOPRESSOR) 25 MG tablet Take 25 mg by mouth 2 (two) times daily. 07/29/14  Yes Historical Provider, MD  nicotine (NICODERM CQ - DOSED IN MG/24 HOURS) 14 mg/24hr patch Place 14 mg onto the skin daily.   Yes Historical Provider, MD   BP 121/51 mmHg  Pulse 83  Temp(Src) 98.7 F (37.1 C) (Oral)  Resp 18  Ht 5\' 5"  (1.651 m)  Wt 214 lb (97.07 kg)  BMI 35.61 kg/m2  SpO2 98%  LMP 10/20/2014   21:25:36 Orthostatic Vital Signs KH  Orthostatic Lying  - BP- Lying: 133/74 mmHg ; Pulse- Lying: 84  Orthostatic Sitting - BP- Sitting: 153/67 mmHg ; Pulse- Sitting: 91  Orthostatic Standing at 0 minutes - BP- Standing at 0 minutes: 155/86  mmHg ; Pulse- Standing at 0 minutes: 86      Physical Exam  2015: Physical examination:  Nursing notes reviewed; Vital signs and O2 SAT reviewed;  Constitutional: Well developed, Well nourished, Well hydrated, In no acute distress; Head:  Normocephalic, atraumatic; Eyes: EOMI, PERRL, No scleral icterus; ENMT: TM's clear bilat. +edemetous nasal turbinates bilat with clear rhinorrhea. Mouth and pharynx normal, Mucous membranes moist; Neck: Supple, Full range of motion, No lymphadenopathy; Cardiovascular:  Regular rate and rhythm, No murmur, rub, or gallop; Respiratory: Breath sounds clear & equal bilaterally, No rales, rhonchi, wheezes.  Speaking full sentences with ease, Normal respiratory effort/excursion; Chest: Nontender, Movement normal; Abdomen: Soft, Nontender, Nondistended, Normal bowel sounds; Genitourinary: No CVA tenderness; Extremities: Pulses normal, No tenderness, No edema, No calf edema or asymmetry.; Neuro: AA&Ox3, Major CN grossly intact. Speech clear.  No facial droop. +left horizontal end gaze fatigable nystagmus. Grips equal. Strength 5/5 equal bilat UE's and LE's.  DTR 2/4 equal bilat UE's and LE's.  No gross sensory deficits.  Normal cerebellar testing bilat UE's (finger-nose) and LE's (heel-shin). ; Skin: Color normal, Warm, Dry.   ED Course  Procedures     EKG Interpretation None      MDM  MDM Reviewed: previous chart, nursing note and vitals Reviewed previous: labs Interpretation: labs   Results for orders placed or performed during the hospital encounter of 10/23/14  CBC with Differential  Result Value Ref Range   WBC 7.9 4.0 - 10.5 K/uL   RBC 4.58 3.87 - 5.11 MIL/uL   Hemoglobin 13.8 12.0 - 15.0 g/dL   HCT 40.2 36.0 - 46.0 %   MCV 87.8 78.0 - 100.0 fL   MCH 30.1 26.0 - 34.0 pg   MCHC 34.3 30.0 - 36.0 g/dL   RDW 13.8 11.5 - 15.5 %   Platelets 258 150 - 400 K/uL   Neutrophils Relative % 52 43 - 77 %   Neutro Abs 4.1 1.7 - 7.7 K/uL   Lymphocytes Relative 36 12 - 46 %   Lymphs Abs 2.9 0.7 - 4.0 K/uL   Monocytes Relative 10 3 - 12 %   Monocytes Absolute 0.8 0.1 - 1.0 K/uL   Eosinophils Relative 2 0 - 5 %   Eosinophils Absolute 0.2 0.0 - 0.7 K/uL   Basophils Relative 0 0 - 1 %   Basophils Absolute 0.0 0.0 - 0.1 K/uL  Comprehensive metabolic panel  Result Value Ref Range   Sodium 140 135 - 145 mmol/L   Potassium 3.9 3.5 - 5.1 mmol/L   Chloride 108 101 - 111 mmol/L   CO2 28 22 - 32 mmol/L   Glucose, Bld 128 (H) 65 - 99 mg/dL   BUN 13 6 - 20 mg/dL    Creatinine, Ser 0.57 0.44 - 1.00 mg/dL   Calcium 8.9 8.9 - 10.3 mg/dL   Total Protein 7.1 6.5 - 8.1 g/dL   Albumin 3.6 3.5 - 5.0 g/dL   AST 19 15 - 41 U/L   ALT 19 14 - 54 U/L   Alkaline Phosphatase 115 38 - 126 U/L   Total Bilirubin 0.6 0.3 - 1.2 mg/dL   GFR calc non Af Amer >60 >60 mL/min   GFR calc Af Amer >60 >60 mL/min   Anion gap 4 (L) 5 - 15  Urinalysis, Routine w reflex microscopic (not at [facility])  Result Value Ref Range   Color, Urine YELLOW YELLOW   APPearance CLOUDY (A) CLEAR   Specific Gravity, Urine 1.015 1.005 -  1.030   pH 7.0 5.0 - 8.0   Glucose, UA NEGATIVE NEGATIVE mg/dL   Hgb urine dipstick NEGATIVE NEGATIVE   Bilirubin Urine NEGATIVE NEGATIVE   Ketones, ur NEGATIVE NEGATIVE mg/dL   Protein, ur NEGATIVE NEGATIVE mg/dL   Urobilinogen, UA 1.0 0.0 - 1.0 mg/dL   Nitrite NEGATIVE NEGATIVE   Leukocytes, UA NEGATIVE NEGATIVE    2220:  Pt states she "feels better" after antivert and wants to go home now. Workup is reassuring. Neuro exam remains intact. Not orthostatic on VS. Pt has ambulated with steady gait, easy resps, NAD. Will continue to tx symptomatically at this time. Dx and testing d/w pt and family.  Questions answered.  Verb understanding, agreeable to d/c home with outpt f/u.         Francine Graven, DO 10/28/14 1207

## 2014-10-23 NOTE — ED Notes (Signed)
Pt stated she was feeling better during ambulation, no dizziness or blurry vision.

## 2014-10-23 NOTE — ED Notes (Signed)
Pt c/o weakness and numbness all over. She states she sees a strobe light in her vision.

## 2014-10-23 NOTE — Discharge Instructions (Signed)
°Emergency Department Resource Guide °1) Find a Doctor and Pay Out of Pocket °Although you won't have to find out who is covered by your insurance plan, it is a good idea to ask around and get recommendations. You will then need to call the office and see if the doctor you have chosen will accept you as a new patient and what types of options they offer for patients who are self-pay. Some doctors offer discounts or will set up payment plans for their patients who do not have insurance, but you will need to ask so you aren't surprised when you get to your appointment. ° °2) Contact Your Local Health Department °Not all health departments have doctors that can see patients for sick visits, but many do, so it is worth a call to see if yours does. If you don't know where your local health department is, you can check in your phone book. The CDC also has a tool to help you locate your state's health department, and many state websites also have listings of all of their local health departments. ° °3) Find a Walk-in Clinic °If your illness is not likely to be very severe or complicated, you may want to try a walk in clinic. These are popping up all over the country in pharmacies, drugstores, and shopping centers. They're usually staffed by nurse practitioners or physician assistants that have been trained to treat common illnesses and complaints. They're usually fairly quick and inexpensive. However, if you have serious medical issues or chronic medical problems, these are probably not your best option. ° °No Primary Care Doctor: °- Call Health Connect at  [phone] - they can help you locate a primary care doctor that  accepts your insurance, provides certain services, etc. °- Physician Referral Service- 1-800-533-3463 ° °Chronic Pain Problems: °Organization         Address  Phone   Notes  °[facility]  ([phone]) [phone] Patients need to be referred by their primary care doctor.  ° °Medication  Assistance: °Organization         Address  Phone   Notes  °[place] [facility] [address] °[place], [place] [address] (336) 641-8030 --Must be a resident of [place] °-- Must have NO insurance coverage whatsoever (no Medicaid/ Medicare, etc.) °-- The pt. MUST have a primary care doctor that directs their care regularly and follows them in the community °  °[facility]  (866) 331-1348   °[employer]  (888) 892-1162   ° °Agencies that provide inexpensive medical care: °Organization         Address  Phone   Notes  °[facility]  (336) 832-8035   °[facility]    (336) 832-7272   °[facility] [address] °[place], [place] [address] (336) 832-4777   °[facility] [address], °[place] (336) 271-4999   °[facility]    (336) 373-0678   °[facility]    (336) 272-1050   °[facility] ° [address], [place] Phone:  (336) 832-4444, Fax:  (336) 832-4440 Hours of Operation:  9 am - 6 pm, M-F.  Also accepts Medicaid/Medicare and self-pay.  °[facility] ° [address], [place] Phone: (336) [phone], Fax: (336) [phone]. Hours of Operation:  8:30 am - 5:30 pm, M-F.  Also accepts Medicaid and self-pay.  °[facility] [place] [address], [place] Phone: (336) 878-6027   °[facility] [address], [place], [place] (336)723-1848, Ext. 123 Mondays & Thursdays: 7-9 AM.  First 15 patients are seen on a first come, first serve basis. °  ° °Medicaid-accepting [place] Providers: ° °Organization         Address  Phone   Notes  °[facility] [address], [place] (336) 641-2100 Also accepts self-pay patients.  °[facility] [address], [place] ° (336) 856-9996   °[facility] [address], [place]  (336) 288-8857   °[facility] [address] [place] [address], [place] (336) 299-7000   °Veita Bland [address], [place]  ° (336) 373-1557 Only accepts [place] Access Medicaid patients after they have their name applied to their card.  ° °Self-Pay (no insurance) in [place]: ° °Organization         Address  Phone   Notes  °Sickle Cell Patients, [facility] [address], [place] (336) 832-1970   °[facility] [address], [place] (336) 832-4400   °[facility] ° [address], [place] (336) 992-4800   °[facility]/Dr. Osei-Bonsu ° [address] [place] [address], [place] or [address], [place] (336) [phone] Phone number for both [place] and [place] locations is the same.  °[facility] [address], [place] (336) 299-0000   °[facility] [address] [place] [address], [place] or [address] (336) 852-7530 °(336) 878-2260   °[facility] [address], [place] (336) 350-1642, phone; (336) 294-5005, fax Sees patients 1st and 3rd Saturday of every month.  Must not qualify for public or private insurance (i.e. Medicaid, Medicare, [place] Health Choice, Veterans' Benefits) • Household income should be no more than 200% of the poverty level •The clinic cannot treat you if you are pregnant or think you are pregnant • Sexually transmitted diseases are not treated at the clinic.  ° ° °Dental Care: °Organization         Address  Phone  Notes  °[place] [facility] [address], [place] (336) 641-6152 Accepts children up to age 21 who are enrolled in Medicaid or [place] Health Choice; pregnant women with a Medicaid card; and children who have applied for Medicaid or [place] Health Choice, but were declined, whose parents can pay a reduced fee at time of service.  °[place]  [facility] [place]  [address], [place] (336) 641-7733 Accepts children up to age 21 who are enrolled in Medicaid or [place] Health Choice; pregnant women with a Medicaid card; and children who have applied for Medicaid or [place] Health Choice, but were declined, whose parents can pay a reduced fee at time of service.  °[facility] ° [address],  (336) 641-4533 Patients are seen by appointment only. Walk-ins are not accepted. [facility] will see patients 18 years of age and older. °Monday - Tuesday (8am-5pm) °Most Wednesdays (8:30-5pm) °$30 per visit, cash only  °[facility] ° [address], [place] (336) [phone] Patients are seen by appointment only. Walk-ins are not accepted. [facility] will see patients 18 years of age and older. °One   Wednesday Evening (Monthly: Volunteer Based).  $30 per visit, cash only  °[facility]  (919) [phone] for adults; Children under age 4, call Graduate Pediatric Dentistry at ([phone]) [phone]. Children aged 4-14, please call ([phone]) [phone] to request a pediatric application. ° Dental services are provided in all areas of dental care including fillings, crowns and bridges, complete and partial dentures, implants, gum treatment, root canals, and extractions. Preventive care is also provided. Treatment is provided to both adults and children. °Patients are selected via a lottery and there is often a waiting list. °  °[facility] [address], °[place] ° (336) 763-8833 [email or web address] °  °[facility] [address], [place], [place] (336)723-1848, Ext. 123 Second and Fourth Thursday of each month, opens at 6:30 AM; Clinic ends at 9 AM.  Patients are seen on a first-come first-served basis, and a limited number are seen during each clinic.  ° °[facility] ° [address] [place] [address], [place], [place] (336) [phone]    Eligibility Requirements °You must have lived in [place], [place], or [place] counties for at least the last three months. °  You cannot be eligible for state or federal sponsored healthcare insurance, including Veterans Administration, Medicaid, or Medicare. °  You generally cannot be eligible for healthcare insurance through your employer.  °  How to apply: °Eligibility screenings are held every Tuesday and Wednesday afternoon from 1:00 pm until 4:00 pm. You do not need an appointment for the interview!  °[facility] [address], [place], [place] 336-631-2330   °[facility]  336-342-8273   °[facility]  336-703-3100   °[facility]  336-570-6415   ° °Behavioral Health Resources in the Community: °Intensive Outpatient Programs °Organization         Address  Phone  Notes  °[facility] [address], [place], [place] 336-878-6098   °[facility] [address], [place], [place] 336-832-9800   °[facility] [address], [place], [place] ° 336-882-2125   °[facility] [address],  °[place], [place] 1-800-853-5163 or 336-641-4981   °Substance Abuse Resources °Organization         Address  Phone  Notes  °[facility]  336-882-2125   °[facility]  336-784-9470   °[facility]  336-285-9073   °[facility]  336-845-3988   °Residential & Outpatient Substance Abuse Program  1-800-659-3381   °Psychological Services °Organization         Address  Phone  Notes  °[facility]  336- 832-9600   °[facility]  336- 378-7881   °[facility] [address], [place] 1-800-853-5163 or 336-641-4981   ° °Mobile Crisis Teams °Organization         Address  Phone  Notes  °[facility], Mobile Crisis Care Unit  1-877-626-1772   °Assertive °[facility] ° [address]  [place], [place] 336-834-9664   °Sharon DeEsch [address] °  336-554-5454   ° °Self-Help/Support Groups °Organization         Address  Phone             Notes  °[facility]  - variety of support groups  336- 373-1402 Call for more information  °Narcotics Anonymous (NA), [facility] [address], °[place]   2 meetings at this location  ° °  Residential Treatment Programs Organization         Address  Phone  Notes  [facility] [address]    [place]  1-(360)800-0002   [facility]  [address] [place] [phone], [place], [place]   [place], [place] [phone] Admissions: 8am-3pm M-F  [facility] [address]    [place], [place] [phone]   [facility] [address] [place], [place], [place]   [facility] [address],  [place], [place]   [facility] Intensive Outpatient [address], [place], [place]   [facility] ([place] [address]  [place], [place] 1-650 307 0892 or (949)846-3544   [facility] [address], [place], [place] Accepts Medicaid  [facility] [address]  [place] 1-219-769-5389 Substance Abuse/Addiction Treatment   [facility]         Address  Phone  Notes  [facility]  812-317-5783   Domenic Schwab, PhD [address] [place], [place]   785-716-8715 or 787-832-2812   [facility] [address] [place], [place] (210)747-4520   [facility] [address] [place], [place] [phone] Insurance/Medicaid/sponsorship through [facility] and Families [address]                                    [place], [place] ([phone] [place] [address], [place] [phone]    Dr. Adele Schilder  [phone]   [facility] Dept. 1) [address] [place] 2) [place] 3)  [place] [address], [place] [phone] [phone]  ([phone]   [place] [phone] or [phone] (After Hours)      Take the prescription as directed.  Call your regular medical doctor tomorrow to schedule a follow up appointment within the next 2 days.  Return to the Emergency Department immediately sooner if worsening.

## 2014-10-24 ENCOUNTER — Telehealth: Payer: Self-pay | Admitting: Family Medicine

## 2014-10-24 NOTE — Telephone Encounter (Signed)
Patient has medicaid. She is on metoprolol 25mg  bid and no other medication. She does not currently have a pcp and cardiologist is who prescribes medication. Appointment given for 11/11/2014 with Dr. Wendi Snipes.

## 2014-11-11 ENCOUNTER — Ambulatory Visit (INDEPENDENT_AMBULATORY_CARE_PROVIDER_SITE_OTHER): Payer: Medicaid Other | Admitting: Family Medicine

## 2014-11-11 ENCOUNTER — Encounter: Payer: Self-pay | Admitting: Family Medicine

## 2014-11-11 VITALS — BP 141/93 | HR 83 | Temp 97.2°F | Ht 65.0 in | Wt 217.0 lb

## 2014-11-11 DIAGNOSIS — Z8673 Personal history of transient ischemic attack (TIA), and cerebral infarction without residual deficits: Secondary | ICD-10-CM | POA: Insufficient documentation

## 2014-11-11 DIAGNOSIS — F411 Generalized anxiety disorder: Secondary | ICD-10-CM | POA: Diagnosis not present

## 2014-11-11 DIAGNOSIS — E010 Iodine-deficiency related diffuse (endemic) goiter: Secondary | ICD-10-CM | POA: Insufficient documentation

## 2014-11-11 DIAGNOSIS — E049 Nontoxic goiter, unspecified: Secondary | ICD-10-CM

## 2014-11-11 DIAGNOSIS — I495 Sick sinus syndrome: Secondary | ICD-10-CM | POA: Diagnosis not present

## 2014-11-11 MED ORDER — BUSPIRONE HCL 10 MG PO TABS: 10.0000 mg | ORAL_TABLET | Freq: Three times a day (TID) | ORAL | Status: DC

## 2014-11-11 NOTE — Assessment & Plan Note (Signed)
Large thyroid on exam, thyroid testing and ultrasound were ordered

## 2014-11-11 NOTE — Patient Instructions (Signed)
Great to meet you!  Lets follow up in about 1 month.  Make sure you discuss cholesterol with your heart doctor Please have records sent to me  I will let you know about your labs in a week or so.   Please be sure to keep your ultrasound appointment

## 2014-11-11 NOTE — Progress Notes (Signed)
Patient ID: Alison Wilkinson, female   DOB: 1970/10/22, 44 y.o.   MRN: [id number]   HPI  Patient presents today to establish care and discuss anxiety  Patient has not had a PCP in several years. She sees cardiology at [facility] and has a cardiac cath scheduled tomorrow   She had a stroke due to atrial fibrillation 2009 and had 6 months of residual left-sided weakness from that. She is now resolved.  She has anxiety She states this is long-standing issue which is been worse in recent years. She's previously taken Ativan and Klonopin with some benefit. She requests that today She has filled out a PHQ-9 which had a score of 7, 0 #9 GAD 7 score is 20  MDQ is screen negative negative for bipolar disorder  Today she denies chest pain. She does have exertional dyspnea which she is getting a cardiac catheterization for tomorrow to address. She denies leg swelling.  Hypertension, sick sinus syndrome States she has a pacemaker and is compliant with metoprolol  PMH: Smoking status noted ROS: Per HPI, otherwise negative  Objective: BP 141/93 mmHg  Pulse 83  Temp(Src) 97.2 F (36.2 C) (Oral)  Ht 5\' 5"  (1.651 m)  Wt 217 lb (98.431 kg)  BMI 36.11 kg/m2  LMP 10/20/2014 Gen: NAD, alert, cooperative with exam HEENT: NCAT, oropharynx clear Neck: thyromegaly, no obvious nodules CV: RRR, good S1/S2, no murmur Resp: CTABL, no wheezes, non-labored Abd: SNTND, BS present, no guarding or organomegaly, marble-sized subcutaneous nodule on left upper quadrant that is slightly tender to palpation, no skin findings except for striae Ext: No edema, warm Neuro: Alert and oriented, No gross deficits  Laboratory review:  Thyroid test checked on 03/24/2014 was 0.018 CBC from 10/23/2014 and CMP were normal except for elevated blood sugar of 128, nonfasting  Assessment and plan:  Thyromegaly Large thyroid on exam, thyroid testing and ultrasound were ordered  Sick sinus syndrome Reported from patient's  history Patient explains that she's had 2 pacemakers, she's going for cardiac cath tomorrow morning Continue metoprolol Request records  Anxiety state Has reported history of long-standing anxiety If she is really hyperthyroid as we are suspicious and this is definitely contributing to her anxiety She requests benzos today, however given this is her first visit and it may be secondary anxiety will start BuSpar and look for the source of anxiety which would be hyperthyroidism.   History of CVA (cerebrovascular accident) Patient's plans in 2009 she had a right-sided CVA with several months of left-sided weakness following She states this is attributed to atrial fibrillation No residual weakness now   Orders Placed This Encounter  Procedures  . US Soft Tissue Head/Neck    Standing Status: Future     Number of Occurrences:      Standing Expiration Date: 01/11/2016    Order Specific Question:  Reason for Exam (SYMPTOM  OR DIAGNOSIS REQUIRED)    Answer:  clinical thyromegaly    Order Specific Question:  Preferred imaging location?    Answer:  [facility]  . TSH  . T3, Free  . T4, Free    Meds ordered this encounter  Medications  . busPIRone (BUSPAR) 10 MG tablet    Sig: Take 1 tablet (10 mg total) by mouth 3 (three) times daily.    Dispense:  60 tablet    Refill:  Riddle, MD [place] 11/11/2014, 2:11 PM

## 2014-11-11 NOTE — Assessment & Plan Note (Signed)
Reported from patient's history Patient explains that she's had 2 pacemakers, she's going for cardiac cath tomorrow morning Continue metoprolol Request records

## 2014-11-11 NOTE — Assessment & Plan Note (Signed)
Patient's plans in 2009 she had a right-sided CVA with several months of left-sided weakness following She states this is attributed to atrial fibrillation No residual weakness now

## 2014-11-11 NOTE — Assessment & Plan Note (Signed)
Has reported history of long-standing anxiety If she is really hyperthyroid as we are suspicious and this is definitely contributing to her anxiety She requests benzos today, however given this is her first visit and it may be secondary anxiety will start BuSpar and look for the source of anxiety which would be hyperthyroidism.

## 2014-11-12 ENCOUNTER — Telehealth: Payer: Self-pay | Admitting: Family Medicine

## 2014-11-12 DIAGNOSIS — E782 Mixed hyperlipidemia: Secondary | ICD-10-CM | POA: Insufficient documentation

## 2014-11-12 DIAGNOSIS — I428 Other cardiomyopathies: Secondary | ICD-10-CM | POA: Insufficient documentation

## 2014-11-12 DIAGNOSIS — E0590 Thyrotoxicosis, unspecified without thyrotoxic crisis or storm: Secondary | ICD-10-CM

## 2014-11-12 LAB — TSH: TSH: 0.005 u[IU]/mL — AB (ref 0.450–4.500)

## 2014-11-12 LAB — T3, FREE: T3, Free: 8.6 pg/mL — ABNORMAL HIGH (ref 2.0–4.4)

## 2014-11-12 LAB — T4, FREE: Free T4: 3.02 ng/dL — ABNORMAL HIGH (ref 0.82–1.77)

## 2014-11-12 NOTE — Telephone Encounter (Signed)
Called to discuss hyperthyroidism. Need to check Thyroid stimulating Ab's. She had a heart cath today and had no blockages but does hav esome abnormality on her myoview.   She would like to stick with [facility] or [facility] for endocrinology.   She was started on lisinopril after her cath today.   She will come in for the lab in the next few days.   Laroy Apple, MD [facility] 11/12/2014, 3:27 PM

## 2014-11-15 ENCOUNTER — Other Ambulatory Visit (INDEPENDENT_AMBULATORY_CARE_PROVIDER_SITE_OTHER): Payer: Medicaid Other

## 2014-11-15 DIAGNOSIS — E0590 Thyrotoxicosis, unspecified without thyrotoxic crisis or storm: Secondary | ICD-10-CM | POA: Diagnosis not present

## 2014-11-15 NOTE — Progress Notes (Signed)
Lab only 

## 2014-11-23 LAB — THYROID STIMULATING IMMUNOGLOBULIN: TSI: 206 % — AB (ref 0–139)

## 2014-11-25 ENCOUNTER — Telehealth: Payer: Self-pay | Admitting: Family Medicine

## 2014-11-25 DIAGNOSIS — E0590 Thyrotoxicosis, unspecified without thyrotoxic crisis or storm: Secondary | ICD-10-CM

## 2014-11-25 NOTE — Telephone Encounter (Signed)
Patient with elevated thyroid stimulating Hormoine. On BB. 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   Will send to endocrinology.   Will ask nursing to notify.   Laroy Apple, MD [facility] 11/25/2014, 1:49 PM

## 2014-12-04 ENCOUNTER — Ambulatory Visit (HOSPITAL_COMMUNITY): Admission: RE | Admit: 2014-12-04 | Payer: Medicaid Other | Source: Ambulatory Visit

## 2014-12-12 ENCOUNTER — Ambulatory Visit (INDEPENDENT_AMBULATORY_CARE_PROVIDER_SITE_OTHER): Payer: Medicaid Other | Admitting: Family Medicine

## 2014-12-12 ENCOUNTER — Encounter: Payer: Self-pay | Admitting: Family Medicine

## 2014-12-12 VITALS — BP 122/85 | HR 81 | Temp 97.8°F | Ht 65.0 in | Wt 216.6 lb

## 2014-12-12 DIAGNOSIS — I10 Essential (primary) hypertension: Secondary | ICD-10-CM

## 2014-12-12 DIAGNOSIS — F411 Generalized anxiety disorder: Secondary | ICD-10-CM | POA: Diagnosis not present

## 2014-12-12 DIAGNOSIS — I429 Cardiomyopathy, unspecified: Secondary | ICD-10-CM

## 2014-12-12 DIAGNOSIS — E0590 Thyrotoxicosis, unspecified without thyrotoxic crisis or storm: Secondary | ICD-10-CM | POA: Diagnosis not present

## 2014-12-12 MED ORDER — BENAZEPRIL HCL 10 MG PO TABS: 10.0000 mg | ORAL_TABLET | Freq: Every day | ORAL | Status: DC

## 2014-12-12 NOTE — Patient Instructions (Addendum)
Great to see you again!  I have sent a prescription for benazepril, we should repeat your labs in 1 month after starting this. I have written orders, but you may get this done wherever you are in a month just please send me results.   Hyperthyroidism The thyroid is a large gland located in the lower front part of your neck. The thyroid helps control metabolism. Metabolism is how your body uses food. It controls metabolism with the hormone thyroxine. When the thyroid is overactive, it produces too much hormone. When this happens, these following problems may occur:   Nervousness  Heat intolerance  Weight loss (in spite of increase food intake)  Diarrhea  Change in hair or skin texture  Palpitations (heart skipping or having extra beats)  Tachycardia (rapid heart rate)  Loss of menstruation (amenorrhea)  Shaking of the hands CAUSES  Grave's Disease (the immune system attacks the thyroid gland). This is the most common cause.  Inflammation of the thyroid gland.  Tumor (usually benign) in the thyroid gland or elsewhere.  Excessive use of thyroid medications (both prescription and 'natural').  Excessive ingestion of Iodine. DIAGNOSIS  To prove hyperthyroidism, your caregiver may do blood tests and ultrasound tests. Sometimes the signs are hidden. It may be necessary for your caregiver to watch this illness with blood tests, either before or after diagnosis and treatment. TREATMENT Short-term treatment There are several treatments to control symptoms. Drugs called beta blockers may give some relief. Drugs that decrease hormone production will provide temporary relief in many people. These measures will usually not give permanent relief. Definitive therapy There are treatments available which can be discussed between you and your caregiver which will permanently treat the problem. These treatments range from surgery (removal of the thyroid), to the use of radioactive iodine  (destroys the thyroid by radiation), to the use of antithyroid drugs (interfere with hormone synthesis). The first two treatments are permanent and usually successful. They most often require hormone replacement therapy for life. This is because it is impossible to remove or destroy the exact amount of thyroid required to make a person euthyroid (normal). HOME CARE INSTRUCTIONS  See your caregiver if the problems you are being treated for get worse. Examples of this would be the problems listed above. SEEK MEDICAL CARE IF: Your general condition worsens. MAKE SURE YOU:   Understand these instructions.  Will watch your condition.  Will get help right away if you are not doing well or get worse. Document Released: 03/15/2005 Document Revised: 06/07/2011 Document Reviewed: 07/27/2006 [facility] Patient Information 2015 [place], [place]. This information is not intended to replace advice given to you by your health care provider. Make sure you discuss any questions you have with your health care provider.

## 2014-12-12 NOTE — Progress Notes (Signed)
   HPI  Patient presents today for follow-up hyperthyroidism, hypertension  Hyperthyroidism Patient's labs came back consistent with Graves' disease. She was seen in endocrinology and started on methimazole. She is tolerating the medicine easily. She has several symptoms consistent with hyperthyroidism including palpitations, anxiety, and panic attacks.  Hypertension States that at home her blood pressures frequently 981-191 systolic in the morning, she also has a fast heart rate.  She has changed which takes metoprolol from 25 twice a day to 50 once a day, taking 2 pills at once.  She has had a heart catheterization and cannot describe exactly the findings but states that her heart is not working as efficiently. She denies current chest pain.  She has orthopnea and leg swelling. She's been started on lisinopril from her cardiologist but states that due to her recall she refuses to take this.  PMH: Smoking status noted ROS: Per HPI  Objective: BP 122/85 mmHg  Pulse 81  Temp(Src) 97.8 F (36.6 C) (Oral)  Ht 5\' 5"  (1.651 m)  Wt 216 lb 9.6 oz (98.249 kg)  BMI 36.04 kg/m2 Gen: NAD, alert, cooperative with exam HEENT: NCAT Neck thyromegaly CV: RRR, good S1/S2, no murmur Resp: CTABL, no wheezes, non-labored Ext: No edema, warm Neuro: Alert and oriented, No gross deficits  Assessment and plan:  # Hyperthyroidism Labs consistent with Graves' disease, has started seeing endocrinology. She is on methimazole, continue I encouraged her to pursue ablation of her thyroid, she is considering this.  # heart Disease Unclear exactly what heart disease is going on, however it sounds like congestive heart failure. No crackles in her lungs today, also no significant leg edema States no blockages on her cath, no stents Request records  # smoking encourage cessation  # Anxiety 2/2 hyperthyroidism, meds likely will not help Encourage aggressive f/u for hyperthyroidism    Orders  Placed This Encounter  Procedures  . Basic Metabolic Panel    Standing Status: Future     Number of Occurrences:      Standing Expiration Date: 12/12/2015    Meds ordered this encounter  Medications  . methimazole (TAPAZOLE) 10 MG tablet    Sig: Take 20 mg by mouth.  . benazepril (LOTENSIN) 10 MG tablet    Sig: Take 1 tablet (10 mg total) by mouth daily.    Dispense:  30 tablet    Refill:  McCune, MD [facility] 12/12/2014, 1:22 PM

## 2014-12-13 ENCOUNTER — Encounter: Payer: Self-pay | Admitting: Family Medicine

## 2014-12-13 NOTE — Progress Notes (Signed)
Patient ID: Alison Wilkinson, female   DOB: 10-05-1970, 44 y.o.   MRN: [id number] Documentation of a cardiac catheterization and stress echocardiogram from 10/31/2014.  Stress echo shows stress-induced wall motion abnormalities in the anterior wall, ejection fraction of 50-55%, 1+ mitral regurgitation, and 1+ tricuspid regurgitation.  A heart catheterization was performed and shows no major stenosis or occlusion of the coronary arteries.  Cardiology review diagnosis her with nonischemic cardiomyopathy with mild reduction in left ventricular EF. Started on metoprolol and ACE inhibitor.

## 2015-01-13 ENCOUNTER — Ambulatory Visit: Payer: Medicaid Other | Admitting: Family Medicine

## 2015-02-12 ENCOUNTER — Telehealth: Payer: Self-pay | Admitting: Family Medicine

## 2015-02-12 ENCOUNTER — Telehealth: Payer: Self-pay

## 2015-02-12 DIAGNOSIS — E0590 Thyrotoxicosis, unspecified without thyrotoxic crisis or storm: Secondary | ICD-10-CM

## 2015-02-12 NOTE — Telephone Encounter (Signed)
Patient aware that we need to know why she needs the referral. She is going to have Dr. Brandt Loosen office fax [place] with what he wants done. Patient given fax number.

## 2015-02-12 NOTE — Telephone Encounter (Signed)
Dr Lisbeth Ply office said that patient needs to be referred to [facility] for possible thyroidectomy and that we need to do that referral because she has Medicaid

## 2015-02-13 NOTE — Telephone Encounter (Signed)
Referral written.   Laroy Apple, MD [facility] 02/13/2015, 7:44 AM

## 2015-03-03 ENCOUNTER — Telehealth: Payer: Self-pay | Admitting: Family Medicine

## 2015-03-03 NOTE — Telephone Encounter (Signed)
denied °

## 2015-04-08 ENCOUNTER — Other Ambulatory Visit (HOSPITAL_COMMUNITY): Payer: Self-pay | Admitting: Otolaryngology

## 2015-04-17 ENCOUNTER — Ambulatory Visit (INDEPENDENT_AMBULATORY_CARE_PROVIDER_SITE_OTHER): Payer: Medicaid Other | Admitting: Family Medicine

## 2015-04-17 ENCOUNTER — Ambulatory Visit (HOSPITAL_COMMUNITY)
Admission: RE | Admit: 2015-04-17 | Discharge: 2015-04-17 | Disposition: A | Discharge status: Home / Self Care | Payer: Medicaid Other | Source: Ambulatory Visit | Attending: Family Medicine | Admitting: Family Medicine

## 2015-04-17 ENCOUNTER — Encounter: Payer: Self-pay | Admitting: Family Medicine

## 2015-04-17 VITALS — BP 126/81 | HR 74 | Temp 97.7°F | Ht 65.0 in | Wt 230.6 lb

## 2015-04-17 DIAGNOSIS — R1011 Right upper quadrant pain: Secondary | ICD-10-CM | POA: Diagnosis present

## 2015-04-17 DIAGNOSIS — E875 Hyperkalemia: Secondary | ICD-10-CM

## 2015-04-17 DIAGNOSIS — R1013 Epigastric pain: Secondary | ICD-10-CM | POA: Insufficient documentation

## 2015-04-17 MED ORDER — DIATRIZOATE MEGLUMINE & SODIUM 66-10 % PO SOLN
ORAL | Status: AC
  Administered 2015-04-17: 30 mL
  Filled 2015-04-17: qty 30

## 2015-04-17 MED ORDER — IOHEXOL 300 MG/ML  SOLN
100.0000 mL | Freq: Once | INTRAMUSCULAR | Status: AC | PRN
  Administered 2015-04-17: 100 mL via INTRAVENOUS

## 2015-04-17 NOTE — Addendum Note (Signed)
Addended by: Timmothy Euler on: 04/17/2015 12:46 PM   Modules accepted: Orders

## 2015-04-17 NOTE — Patient Instructions (Signed)
Great to see you!  If the Ct is normal we will work on the HIDA scan  You have to tolerate fluids, If you are unable to tolerate small amounts of fluids you will need to go to the emergency room.   Abdominal Pain, Adult Many things can cause abdominal pain. Usually, abdominal pain is not caused by a disease and will improve without treatment. It can often be observed and treated at home. Your health care provider will do a physical exam and possibly order blood tests and X-rays to help determine the seriousness of your pain. However, in many cases, more time must pass before a clear cause of the pain can be found. Before that point, your health care provider may not know if you need more testing or further treatment. HOME CARE INSTRUCTIONS Monitor your abdominal pain for any changes. The following actions may help to alleviate any discomfort you are experiencing:  Only take over-the-counter or prescription medicines as directed by your health care provider.  Do not take laxatives unless directed to do so by your health care provider.  Try a clear liquid diet (broth, tea, or water) as directed by your health care provider. Slowly move to a bland diet as tolerated. SEEK MEDICAL CARE IF:  You have unexplained abdominal pain.  You have abdominal pain associated with nausea or diarrhea.  You have pain when you urinate or have a bowel movement.  You experience abdominal pain that wakes you in the night.  You have abdominal pain that is worsened or improved by eating food.  You have abdominal pain that is worsened with eating fatty foods.  You have a fever. SEEK IMMEDIATE MEDICAL CARE IF:  Your pain does not go away within 2 hours.  You keep throwing up (vomiting).  Your pain is felt only in portions of the abdomen, such as the right side or the left lower portion of the abdomen.  You pass bloody or black tarry stools. MAKE SURE YOU:  Understand these instructions.  Will watch  your condition.  Will get help right away if you are not doing well or get worse.   This information is not intended to replace advice given to you by your health care provider. Make sure you discuss any questions you have with your health care provider.   Document Released: 12/23/2004 Document Revised: 12/04/2014 Document Reviewed: 11/22/2012 Elsevier Interactive Patient Education [employer].

## 2015-04-17 NOTE — Progress Notes (Addendum)
HPI  Patient presents today today for right upper quadrant pain for ER follow-up.  Patient was seen at [facility] on January 18 for right upper quadrant pain and bilious vomiting. She states that it is an ultrasound of her abdomen and wanted to do a HIDA scan for follow up but couldn't  He reports little to no by mouth tolerance over the last 3 days. She describes her emesis as dark green. She states that she can drink a small amount of liquid and has intense right upper quadrant pain and vomiting.  Has a history over the last month or so of intermittent similar pain, however it has not been this bad. It has been worse in the last few weeks with food.   Review of labs for [facility]: Potassium 4.4, sodium 141 Creatinine 0.68 AST 17.3 ALC 12 Troponin negative WBC 8.7 Hemoglobin 16.1  Abdominal ultrasound negative Portable chest x-ray negative   PMH: Smoking status noted ROS: Per HPI  Objective: BP 126/81 mmHg  Pulse 74  Temp(Src) 97.7 F (36.5 C) (Oral)  Ht 5' 5" (1.651 m)  Wt 230 lb 9.6 oz (104.599 kg)  BMI 38.37 kg/m2 Gen: NAD, alert, cooperative with exam HEENT: NCAT CV: RRR, good S1/S2, no murmur Resp: CTABL, no wheezes, non-labored Abd: Soft, tenderness to palpation of the right upper quadrant, hypoactive bowel sounds, no Murphy sign Ext: No edema, warm Neuro: Alert and oriented, No gross deficits  Assessment and plan:  # Right upper quadrant pain It is concerning for acute cholecystitis, however her ultrasound for [facility] is normal. Her labs are also normal On my exam she has no Murphy sign, however she does have characteristic pain of acute cholecystitis After my exam and her history I am concerned for obstruction or other serious intra-abdominal pathology Stat CT abdomen with contrast The size the importance of fluids, she's not able to tolerate them at all, even if her CAT scan is normal, I've encouraged her to seek emergency medical  help.  HIDA if negative    Orders Placed This Encounter  Procedures  . CT Abdomen Pelvis W Contrast    Standing Status: Future     Number of Occurrences:      Standing Expiration Date: 07/15/2016    Order Specific Question:  If indicated for the ordered procedure, I authorize the administration of contrast media per Radiology protocol    Answer:  Yes    Order Specific Question:  Reason for Exam (SYMPTOM  OR DIAGNOSIS REQUIRED)    Answer:  RUQ pain, concern for obstruction    Order Specific Question:  Is the patient pregnant?    Answer:  No    Order Specific Question:  Preferred imaging location?    Answer:  [facility]  . CMP14+EGFR  . CBC with Differential    Meds ordered this encounter  Medications  . famotidine (PEPCID) 20 MG tablet    Sig: Take 20 mg by mouth 2 (two) times daily.  . Promethazine HCl (PHENERGAN PO)    Sig: Take by mouth.    Sam Bradshaw, MD [facility] 04/17/2015, 12:14 PM     Addendum  CT abd without acute processes Previous US abd is inadequate in teh ED with contracted Gallbladder so will go ahead and repeat. Also add HIDA scan with characteristic pain  Sam Bradshaw, MD [facility] 04/17/2015, 5:49 PM    

## 2015-04-17 NOTE — Addendum Note (Signed)
Addended by: Timmothy Euler on: 04/17/2015 05:50 PM   Modules accepted: Orders

## 2015-04-18 ENCOUNTER — Encounter (HOSPITAL_COMMUNITY)
Admission: RE | Admit: 2015-04-18 | Discharge: 2015-04-18 | Disposition: A | Discharge status: Home / Self Care | Payer: Medicaid Other | Source: Ambulatory Visit | Attending: Otolaryngology | Admitting: Otolaryngology

## 2015-04-18 ENCOUNTER — Other Ambulatory Visit (HOSPITAL_COMMUNITY): Payer: Self-pay | Admitting: *Deleted

## 2015-04-18 ENCOUNTER — Encounter (HOSPITAL_COMMUNITY): Payer: Self-pay

## 2015-04-18 ENCOUNTER — Other Ambulatory Visit: Payer: Medicaid Other

## 2015-04-18 DIAGNOSIS — Z01812 Encounter for preprocedural laboratory examination: Secondary | ICD-10-CM | POA: Diagnosis not present

## 2015-04-18 DIAGNOSIS — I4891 Unspecified atrial fibrillation: Secondary | ICD-10-CM | POA: Insufficient documentation

## 2015-04-18 DIAGNOSIS — I495 Sick sinus syndrome: Secondary | ICD-10-CM | POA: Diagnosis not present

## 2015-04-18 DIAGNOSIS — I10 Essential (primary) hypertension: Secondary | ICD-10-CM | POA: Diagnosis not present

## 2015-04-18 DIAGNOSIS — E0590 Thyrotoxicosis, unspecified without thyrotoxic crisis or storm: Secondary | ICD-10-CM | POA: Diagnosis not present

## 2015-04-18 DIAGNOSIS — F17210 Nicotine dependence, cigarettes, uncomplicated: Secondary | ICD-10-CM | POA: Diagnosis not present

## 2015-04-18 DIAGNOSIS — I429 Cardiomyopathy, unspecified: Secondary | ICD-10-CM | POA: Insufficient documentation

## 2015-04-18 DIAGNOSIS — Z8673 Personal history of transient ischemic attack (TIA), and cerebral infarction without residual deficits: Secondary | ICD-10-CM | POA: Diagnosis not present

## 2015-04-18 DIAGNOSIS — K219 Gastro-esophageal reflux disease without esophagitis: Secondary | ICD-10-CM | POA: Insufficient documentation

## 2015-04-18 DIAGNOSIS — Z01818 Encounter for other preprocedural examination: Secondary | ICD-10-CM | POA: Insufficient documentation

## 2015-04-18 DIAGNOSIS — Z79899 Other long term (current) drug therapy: Secondary | ICD-10-CM | POA: Diagnosis not present

## 2015-04-18 DIAGNOSIS — E7800 Pure hypercholesterolemia, unspecified: Secondary | ICD-10-CM | POA: Insufficient documentation

## 2015-04-18 DIAGNOSIS — Z950 Presence of cardiac pacemaker: Secondary | ICD-10-CM | POA: Insufficient documentation

## 2015-04-18 DIAGNOSIS — E875 Hyperkalemia: Secondary | ICD-10-CM

## 2015-04-18 HISTORY — DX: Other specified postprocedural states: Z98.890

## 2015-04-18 HISTORY — DX: Cardiac arrhythmia, unspecified: I49.9

## 2015-04-18 HISTORY — DX: Essential (primary) hypertension: I10

## 2015-04-18 HISTORY — DX: Reserved for inherently not codable concepts without codable children: IMO0001

## 2015-04-18 HISTORY — DX: Nausea with vomiting, unspecified: R11.2

## 2015-04-18 HISTORY — DX: Thyrotoxicosis, unspecified without thyrotoxic crisis or storm: E05.90

## 2015-04-18 HISTORY — DX: Gastro-esophageal reflux disease without esophagitis: K21.9

## 2015-04-18 LAB — CBC WITH DIFFERENTIAL/PLATELET
Basophils Absolute: 0 x10E3/uL (ref 0.0–0.2)
Basos: 0 %
EOS (ABSOLUTE): 0.1 x10E3/uL (ref 0.0–0.4)
Eos: 1 %
Hematocrit: 43.7 % (ref 34.0–46.6)
Hemoglobin: 14.7 g/dL (ref 11.1–15.9)
Immature Grans (Abs): 0 x10E3/uL (ref 0.0–0.1)
Immature Granulocytes: 0 %
Lymphocytes Absolute: 2.6 x10E3/uL (ref 0.7–3.1)
Lymphs: 32 %
MCH: 31.3 pg (ref 26.6–33.0)
MCHC: 33.6 g/dL (ref 31.5–35.7)
MCV: 93 fL (ref 79–97)
Monocytes Absolute: 0.6 x10E3/uL (ref 0.1–0.9)
Monocytes: 8 %
Neutrophils Absolute: 4.9 x10E3/uL (ref 1.4–7.0)
Neutrophils: 59 %
Platelets: 295 x10E3/uL (ref 150–379)
RBC: 4.69 x10E6/uL (ref 3.77–5.28)
RDW: 14.8 % (ref 12.3–15.4)
WBC: 8.3 x10E3/uL (ref 3.4–10.8)

## 2015-04-18 LAB — CMP14+EGFR
ALBUMIN: 4.3 g/dL (ref 3.5–5.5)
ALK PHOS: 96 IU/L (ref 39–117)
ALT: 12 IU/L (ref 0–32)
AST: 14 IU/L (ref 0–40)
Albumin/Globulin Ratio: 1.7 (ref 1.1–2.5)
BUN/Creatinine Ratio: 11 (ref 9–23)
BUN: 10 mg/dL (ref 6–24)
Bilirubin Total: 0.7 mg/dL (ref 0.0–1.2)
CO2: 26 mmol/L (ref 18–29)
CREATININE: 0.89 mg/dL (ref 0.57–1.00)
Calcium: 9.7 mg/dL (ref 8.7–10.2)
Chloride: 102 mmol/L (ref 96–106)
GFR calc Af Amer: 91 mL/min/{1.73_m2} (ref >59)
GFR calc non Af Amer: 79 mL/min/{1.73_m2} (ref >59)
GLUCOSE: 89 mg/dL (ref 65–99)
Globulin, Total: 2.6 g/dL (ref 1.5–4.5)
Potassium: 5.8 mmol/L — ABNORMAL HIGH (ref 3.5–5.2)
Sodium: 138 mmol/L (ref 134–144)
Total Protein: 6.9 g/dL (ref 6.0–8.5)

## 2015-04-18 LAB — CBC
HEMATOCRIT: 44.1 % (ref 36.0–46.0)
HEMOGLOBIN: 15.2 g/dL — AB (ref 12.0–15.0)
MCH: 32.3 pg (ref 26.0–34.0)
MCHC: 34.5 g/dL (ref 30.0–36.0)
MCV: 93.8 fL (ref 78.0–100.0)
PLATELETS: 266 10*3/uL (ref 150–400)
RBC: 4.7 MIL/uL (ref 3.87–5.11)
RDW: 14.1 % (ref 11.5–15.5)
WBC: 6.7 10*3/uL (ref 4.0–10.5)

## 2015-04-18 LAB — BASIC METABOLIC PANEL
Anion gap: 10 (ref 5–15)
BUN: 9 mg/dL (ref 6–20)
CALCIUM: 9.1 mg/dL (ref 8.9–10.3)
CHLORIDE: 105 mmol/L (ref 101–111)
CO2: 25 mmol/L (ref 22–32)
CREATININE: 0.76 mg/dL (ref 0.44–1.00)
Glucose, Bld: 97 mg/dL (ref 65–99)
Potassium: 4.4 mmol/L (ref 3.5–5.1)
SODIUM: 140 mmol/L (ref 135–145)

## 2015-04-18 LAB — HCG, SERUM, QUALITATIVE: PREG SERUM: NEGATIVE

## 2015-04-18 NOTE — Progress Notes (Signed)
Lab only 

## 2015-04-18 NOTE — Pre-Procedure Instructions (Signed)
    Alison Wilkinson  04/18/2015      [facility] [address] [place], [place] - [address] [place] [address] Phone: [phone] Fax: [phone]    Your procedure is scheduled on Friday, April 25, 2015 at 7:30 AM.   Report to [facility] Entrance "A" Admitting Office at 5:30 AM.   Call this number if you have problems the morning of surgery: [phone]   Any questions prior to day of surgery, please call [phone] between 8 & 4 PM.   Remember:  Do not eat food or drink liquids after midnight Thursday, 04/24/15.  Take these medicines the morning of surgery with A SIP OF WATER: Famotidine (Pepcid),  Methimazole (Tapazole), Metoprolol (Lopressor), Alprazolam (Xanax) - if needed   Do not wear jewelry, make-up or nail polish.  Do not wear lotions, powders, or perfumes.  You may wear deodorant.  Do not shave 48 hours prior to surgery.    Do not bring valuables to the hospital.  [facility] is not responsible for any belongings or valuables.  Contacts, dentures or bridgework may not be worn into surgery.  Leave your suitcase in the car.  After surgery it may be brought to your room.  For patients admitted to the hospital, discharge time will be determined by your treatment team.  Special instructions:  See "Preparing for Surgery" Instruction sheet.  Please read over the following fact sheets that you were given. Pain Booklet, Coughing and Deep Breathing and Surgical Site Infection Prevention

## 2015-04-18 NOTE — Addendum Note (Signed)
Addended by: Thana Ates on: 04/18/2015 02:04 PM   Modules accepted: Orders

## 2015-04-18 NOTE — Progress Notes (Signed)
Requested cardiac studies from Dr. Jac Canavan. [facility]), also faxed PPM form.   Requested ekg, CXR  From [facility]. (done 04/16/15)

## 2015-04-18 NOTE — Progress Notes (Addendum)
Anesthesia PAT Evaluation: Patient is a 45 year old female scheduled for total thyroidectomy on 04/25/15 by Dr. Redmond Baseman. Dx: Hyperthyroidism. Clinically euthyroid at her 01/24/15 visit, on medication therapy. She wished to proceed with thyroidectomy as she did not want the potential side effects of radioiodine ablation.   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 BMI is consistent with obesity.     PCP is Dr. Kenn File with [facility] in [place]. Endocrinologist is Dr. Charlett Nose ([facility], see Care Everywhere). Primary cardiologist is Dr. Fuller Song. EP cardiologist is Dr. Gabriel Carina ([facility], see Care Everywhere).  Meds include albuterol, Xanax, Pepcid, Tapazole, Lopressor, Phenergan.  PAT Vitals: T 36.9C, HR 57, RR 20, BP 116/82, O2 sat 96%. Exam shows a pleasant Caucasian female in NAD. Heart RRR, no murmur noted. Left sided PPM. Lungs clear. No pre-tibial pitting edema. No exophthalmus. No tremor. Dark front tooth. She reports tremor has now resolved. No SOB. Stable exertional dyspnea (was able to walk from hospital entrance to PAT without SOB). No more palpitations/heart racing. Denied ETOH and illicit drug use.  04/16/15 EKG [facility]; reported done in ED during "gallbladder attack"): Requested.  11/12/14 Cardiac cath (done due to abnormal stress echo; [facility], see Care Everywhere): 1. Normal left ventricular chamber dimension. 2. Mildly reduced left ventricular ejection fraction 45% with mild global hypokinesia most notable in the anteroapical segment CONCLUSIONS:  1. Abnormal stress echocardiogram. 2. Angiographically normal for age epicardial coronary arteries. 3. Nonischemic heart myopathy with  mild reduction in left ventricular ejection fraction. Uncertain if this is related to hypertension. 4. Hypertension RECOMMENDATIONS:  1. Initiate Toprol. Initiate ACE inhibitor therapy. 2. Aggressive risk factor modification  10/31/14 Stress echo: This was an abnormal stress echocardiogram. Normal resting wall motion with stress-induced wall abnormalities consistent with ischemia in the anterior wall. Anterior wall muscle does not thicken in stress imaging, which could be consistent with ischemia. This stress image however failed to show overall LV thickening due to respiratory interference. Clinical correlation recommended. There is mild 1+ MR and mild 1+ TR. LV is normal in size, wall thickness and wall motion with EF 50-55%. LA is mildly dilated. No ischemic ECG changes noted.  04/16/15 CXR [facility]): Requested.  04/17/15 CT abd/pelvis: IMPRESSION: 1. No acute abdominal findings. 2. Normal gallbladder and pancreas.  Preoperative lab noted. She reports she is having labs per endocrinology today which will be forwarded to his office to review. She is planning to contact his office to ensure he is aware of her upcoming surgery. (Free T4 0.73L and Free T3 2.2 on 03/28/15, TSH 0.006L on 03/07/15 in [place])  Chart will be left for follow-up additional records (EKG, CXR), PPM perioperative RX, and thyroid study results.  George [facility] Stay Center/Anesthesiology Phone [phone] 04/18/2015 6:32 PM  Addendum: Recent records from [facility]) received. She was seen for what was thought to be biliary colic. Negative troponin with "very low" suspicion for ACS. Had normal coronaries by cath within the past six months.   04/16/15 1V CXR ([facility]): Findings: Single-view of the chest does not demonstrate a focal consolidation. There is no pleural effusion or pneumothorax. Cardiac silhouette win WNL. Left pectoral pacemaker device. No acute  osseous pathology. Impression: No active disease.   04/16/15  EKG [facility]):  SR, low voltage precordial leads, ST elevation, probable normal early repolarization pattern. She had early repolarization pattern on prior EKG 03/24/14.  EP Cardiologist Dr. Deno Etienne sent their standard Evaluation For Perioperative Device Management form indicating: Not pacing at baseline. Monitor rhythm. Transcutaneous pacing pads available in OR. Use magnet if needed to prevent inhibition. Check ECG post-op. If rhythm normal/unchanged, then follow up "home monitoring" within 3 days. If rhythm change noted, then notify cardiology team.  She has a Biotronik PPM (Model [place]ia). (Update 04/23/15 3:22 PM: I also called and spoke with the [facility] about the close proximity of patient's device to the operative site. Because patient is not pacer dependent, the only additional recommendations were to use Bipolar cautery with short bursts and to place the grounding pads as far away as possible, ie thighs or buttocks. Expected magnet response, if needed, is DOO @ 90 bpm. The [employer] main # is [phone]. The main rep for [facility] is Ronne Binning.)  Reviewed labs that were done by her PCP on 04/18/15 (per patient these were to include thyroid studies that would be forwarded to her endocrinologist). No thyroid study results noted, only metabolic panel. I called her PCP office to confirm as well. Since last thyroid studies were in 02/2015 I went a head and reviewed with anesthesiologist Dr. Glennon Mac. Last free T4 and T3 were not elevated, and patient was without clinical symptoms of hyperthyroidism when I evaluated her last week. She also remains on b-blocker and Tapazole therapy. If no change in her symptoms then follow-up thyroid studies would not be indicated pre-operatively from an anesthesia standpoint.    George Hugh [facility] Short Stay Center/Anesthesiology Phone [phone] 04/21/2015 1:11 PM

## 2015-04-18 NOTE — Addendum Note (Signed)
Addended by: Thana Ates on: 04/18/2015 02:07 PM   Modules accepted: Orders

## 2015-04-19 LAB — BMP8+EGFR
BUN/Creatinine Ratio: 13 (ref 9–23)
BUN: 10 mg/dL (ref 6–24)
CHLORIDE: 102 mmol/L (ref 96–106)
CO2: 24 mmol/L (ref 18–29)
CREATININE: 0.8 mg/dL (ref 0.57–1.00)
Calcium: 9.1 mg/dL (ref 8.7–10.2)
GFR calc Af Amer: 104 mL/min/{1.73_m2} (ref >59)
GFR calc non Af Amer: 90 mL/min/{1.73_m2} (ref >59)
GLUCOSE: 94 mg/dL (ref 65–99)
Potassium: 4.3 mmol/L (ref 3.5–5.2)
Sodium: 141 mmol/L (ref 134–144)

## 2015-04-24 NOTE — Anesthesia Preprocedure Evaluation (Addendum)
Anesthesia Evaluation    History of Anesthesia Complications (+) PONV and history of anesthetic complications  Airway Mallampati: II  TM Distance: >3 FB Neck ROM: Full    Dental no notable dental hx.    Pulmonary shortness of breath, Current Smoker,    Pulmonary exam normal breath sounds clear to auscultation       Cardiovascular hypertension, Pt. on home beta blockers Normal cardiovascular exam+ dysrhythmias + pacemaker  Rhythm:Regular Rate:Normal     Neuro/Psych  Headaches, Anxiety CVA    GI/Hepatic GERD  ,  Endo/Other  Hyperthyroidism   Renal/GU      Musculoskeletal   Abdominal (+) + obese,   Peds  Hematology   Anesthesia Other Findings   Reproductive/Obstetrics negative OB ROS                            Anesthesia Physical Anesthesia Plan  ASA: III  Anesthesia Plan: General   Post-op Pain Management:    Induction: Intravenous  Airway Management Planned: Oral ETT  Additional Equipment:   Intra-op Plan:   Post-operative Plan: Extubation in OR  Informed Consent: I have reviewed the patients History and Physical, chart, labs and discussed the procedure including the risks, benefits and alternatives for the proposed anesthesia with the patient or authorized representative who has indicated his/her understanding and acceptance.   Dental advisory given  Plan Discussed with: CRNA  Anesthesia Plan Comments:         Anesthesia Quick Evaluation

## 2015-04-25 ENCOUNTER — Encounter (HOSPITAL_COMMUNITY): Payer: Self-pay | Admitting: *Deleted

## 2015-04-25 ENCOUNTER — Encounter (HOSPITAL_COMMUNITY)
Admission: RE | Disposition: A | Discharge status: Home / Self Care | Payer: Self-pay | Source: Ambulatory Visit | Attending: Otolaryngology

## 2015-04-25 ENCOUNTER — Ambulatory Visit (HOSPITAL_COMMUNITY): Payer: Medicaid Other | Admitting: Anesthesiology

## 2015-04-25 ENCOUNTER — Ambulatory Visit (HOSPITAL_COMMUNITY): Payer: Medicaid Other | Admitting: Vascular Surgery

## 2015-04-25 ENCOUNTER — Observation Stay (HOSPITAL_COMMUNITY)
Admission: RE | Admit: 2015-04-25 | Discharge: 2015-04-26 | Disposition: A | Discharge status: Home / Self Care | Payer: Medicaid Other | Source: Ambulatory Visit | Attending: Otolaryngology | Admitting: Otolaryngology

## 2015-04-25 DIAGNOSIS — F419 Anxiety disorder, unspecified: Secondary | ICD-10-CM | POA: Diagnosis not present

## 2015-04-25 DIAGNOSIS — C73 Malignant neoplasm of thyroid gland: Secondary | ICD-10-CM | POA: Insufficient documentation

## 2015-04-25 DIAGNOSIS — E7800 Pure hypercholesterolemia, unspecified: Secondary | ICD-10-CM | POA: Insufficient documentation

## 2015-04-25 DIAGNOSIS — Z6837 Body mass index (BMI) 37.0-37.9, adult: Secondary | ICD-10-CM | POA: Diagnosis not present

## 2015-04-25 DIAGNOSIS — Z79899 Other long term (current) drug therapy: Secondary | ICD-10-CM | POA: Insufficient documentation

## 2015-04-25 DIAGNOSIS — Z8673 Personal history of transient ischemic attack (TIA), and cerebral infarction without residual deficits: Secondary | ICD-10-CM | POA: Diagnosis not present

## 2015-04-25 DIAGNOSIS — I4891 Unspecified atrial fibrillation: Secondary | ICD-10-CM | POA: Diagnosis not present

## 2015-04-25 DIAGNOSIS — I10 Essential (primary) hypertension: Secondary | ICD-10-CM | POA: Insufficient documentation

## 2015-04-25 DIAGNOSIS — K219 Gastro-esophageal reflux disease without esophagitis: Secondary | ICD-10-CM | POA: Insufficient documentation

## 2015-04-25 DIAGNOSIS — I429 Cardiomyopathy, unspecified: Secondary | ICD-10-CM | POA: Diagnosis not present

## 2015-04-25 DIAGNOSIS — I495 Sick sinus syndrome: Secondary | ICD-10-CM | POA: Insufficient documentation

## 2015-04-25 DIAGNOSIS — Z950 Presence of cardiac pacemaker: Secondary | ICD-10-CM | POA: Diagnosis not present

## 2015-04-25 DIAGNOSIS — F17210 Nicotine dependence, cigarettes, uncomplicated: Secondary | ICD-10-CM | POA: Insufficient documentation

## 2015-04-25 DIAGNOSIS — E669 Obesity, unspecified: Secondary | ICD-10-CM | POA: Diagnosis not present

## 2015-04-25 DIAGNOSIS — E0590 Thyrotoxicosis, unspecified without thyrotoxic crisis or storm: Secondary | ICD-10-CM | POA: Diagnosis present

## 2015-04-25 HISTORY — PX: THYROIDECTOMY: SHX17

## 2015-04-25 LAB — CALCIUM
CALCIUM: 8.6 mg/dL — AB (ref 8.9–10.3)
Calcium: 8.1 mg/dL — ABNORMAL LOW (ref 8.9–10.3)

## 2015-04-25 SURGERY — THYROIDECTOMY
Anesthesia: General | Laterality: Bilateral

## 2015-04-25 MED ORDER — FENTANYL CITRATE (PF) 100 MCG/2ML IJ SOLN
INTRAMUSCULAR | Status: DC | PRN
  Administered 2015-04-25: 100 ug via INTRAVENOUS
  Administered 2015-04-25: 50 ug via INTRAVENOUS
  Administered 2015-04-25: 100 ug via INTRAVENOUS

## 2015-04-25 MED ORDER — ROCURONIUM BROMIDE 100 MG/10ML IV SOLN
INTRAVENOUS | Status: DC | PRN
  Administered 2015-04-25: 10 mg via INTRAVENOUS
  Administered 2015-04-25: 40 mg via INTRAVENOUS

## 2015-04-25 MED ORDER — HYDROMORPHONE HCL 1 MG/ML IJ SOLN
INTRAMUSCULAR | Status: AC
  Filled 2015-04-25: qty 1

## 2015-04-25 MED ORDER — FENTANYL CITRATE (PF) 250 MCG/5ML IJ SOLN
INTRAMUSCULAR | Status: AC
  Filled 2015-04-25: qty 5

## 2015-04-25 MED ORDER — ONDANSETRON HCL 4 MG/2ML IJ SOLN
INTRAMUSCULAR | Status: AC
  Filled 2015-04-25: qty 2

## 2015-04-25 MED ORDER — GLYCOPYRROLATE 0.2 MG/ML IJ SOLN
INTRAMUSCULAR | Status: DC | PRN
  Administered 2015-04-25: 0.6 mg via INTRAVENOUS

## 2015-04-25 MED ORDER — LABETALOL HCL 5 MG/ML IV SOLN
INTRAVENOUS | Status: AC
  Filled 2015-04-25: qty 4

## 2015-04-25 MED ORDER — ALBUTEROL SULFATE (2.5 MG/3ML) 0.083% IN NEBU: 2.5000 mg | INHALATION_SOLUTION | Freq: Four times a day (QID) | RESPIRATORY_TRACT | Status: DC | PRN

## 2015-04-25 MED ORDER — ARTIFICIAL TEARS OP OINT
TOPICAL_OINTMENT | OPHTHALMIC | Status: AC
  Filled 2015-04-25: qty 7

## 2015-04-25 MED ORDER — PROPOFOL 10 MG/ML IV BOLUS
INTRAVENOUS | Status: DC | PRN
  Administered 2015-04-25: 200 mg via INTRAVENOUS

## 2015-04-25 MED ORDER — LIDOCAINE-EPINEPHRINE 1 %-1:100000 IJ SOLN
INTRAMUSCULAR | Status: DC | PRN
  Administered 2015-04-25: 6 mL

## 2015-04-25 MED ORDER — MORPHINE SULFATE (PF) 2 MG/ML IV SOLN
2.0000 mg | INTRAVENOUS | Status: DC | PRN
  Administered 2015-04-25: 2 mg via INTRAVENOUS
  Administered 2015-04-25 – 2015-04-26 (×4): 4 mg via INTRAVENOUS
  Filled 2015-04-25 (×4): qty 2
  Filled 2015-04-25: qty 1

## 2015-04-25 MED ORDER — METOPROLOL TARTRATE 50 MG PO TABS
50.0000 mg | ORAL_TABLET | Freq: Every day | ORAL | Status: DC
  Administered 2015-04-26: 50 mg via ORAL
  Filled 2015-04-25: qty 1

## 2015-04-25 MED ORDER — LIDOCAINE HCL 4 % MT SOLN
OROMUCOSAL | Status: DC | PRN
  Administered 2015-04-25: 4 mL via TOPICAL

## 2015-04-25 MED ORDER — LIDOCAINE HCL (CARDIAC) 20 MG/ML IV SOLN
INTRAVENOUS | Status: AC
  Filled 2015-04-25: qty 10

## 2015-04-25 MED ORDER — MIDAZOLAM HCL 2 MG/2ML IJ SOLN
INTRAMUSCULAR | Status: AC
  Filled 2015-04-25: qty 2

## 2015-04-25 MED ORDER — ROCURONIUM BROMIDE 50 MG/5ML IV SOLN
INTRAVENOUS | Status: AC
  Filled 2015-04-25: qty 2

## 2015-04-25 MED ORDER — DEXAMETHASONE SODIUM PHOSPHATE 4 MG/ML IJ SOLN
INTRAMUSCULAR | Status: DC | PRN
  Administered 2015-04-25: 8 mg via INTRAVENOUS

## 2015-04-25 MED ORDER — ARTIFICIAL TEARS OP OINT
TOPICAL_OINTMENT | OPHTHALMIC | Status: DC | PRN
  Administered 2015-04-25: 1 via OPHTHALMIC

## 2015-04-25 MED ORDER — CEFAZOLIN SODIUM 1-5 GM-% IV SOLN
1.0000 g | Freq: Three times a day (TID) | INTRAVENOUS | Status: AC
  Administered 2015-04-25 – 2015-04-26 (×3): 1 g via INTRAVENOUS
  Filled 2015-04-25 (×3): qty 50

## 2015-04-25 MED ORDER — DEXAMETHASONE SODIUM PHOSPHATE 4 MG/ML IJ SOLN
INTRAMUSCULAR | Status: AC
  Filled 2015-04-25: qty 2

## 2015-04-25 MED ORDER — PROMETHAZINE HCL 25 MG RE SUPP: 12.5000 mg | Freq: Four times a day (QID) | RECTAL | Status: DC | PRN

## 2015-04-25 MED ORDER — LACTATED RINGERS IV SOLN
INTRAVENOUS | Status: DC | PRN
  Administered 2015-04-25: 07:00:00 via INTRAVENOUS

## 2015-04-25 MED ORDER — MEPERIDINE HCL 25 MG/ML IJ SOLN: 6.2500 mg | INTRAMUSCULAR | Status: DC | PRN

## 2015-04-25 MED ORDER — LIDOCAINE-EPINEPHRINE 1 %-1:100000 IJ SOLN
INTRAMUSCULAR | Status: AC
  Filled 2015-04-25: qty 1

## 2015-04-25 MED ORDER — FAMOTIDINE 20 MG PO TABS
20.0000 mg | ORAL_TABLET | Freq: Two times a day (BID) | ORAL | Status: DC
  Administered 2015-04-25 – 2015-04-26 (×2): 20 mg via ORAL
  Filled 2015-04-25 (×2): qty 1

## 2015-04-25 MED ORDER — LEVOTHYROXINE SODIUM 75 MCG PO TABS
150.0000 ug | ORAL_TABLET | Freq: Every day | ORAL | Status: DC
  Administered 2015-04-26: 150 ug via ORAL
  Filled 2015-04-25: qty 2

## 2015-04-25 MED ORDER — HYDROCODONE-ACETAMINOPHEN 5-325 MG PO TABS
1.0000 | ORAL_TABLET | ORAL | Status: DC | PRN
  Administered 2015-04-25 – 2015-04-26 (×2): 2 via ORAL
  Filled 2015-04-25 (×2): qty 2

## 2015-04-25 MED ORDER — KCL IN DEXTROSE-NACL 20-5-0.45 MEQ/L-%-% IV SOLN
INTRAVENOUS | Status: DC
  Administered 2015-04-25: 1 mL via INTRAVENOUS
  Administered 2015-04-25: 12:00:00 via INTRAVENOUS
  Filled 2015-04-25 (×2): qty 1000

## 2015-04-25 MED ORDER — PROMETHAZINE HCL 25 MG/ML IJ SOLN: 6.2500 mg | INTRAMUSCULAR | Status: DC | PRN

## 2015-04-25 MED ORDER — PROMETHAZINE HCL 25 MG PO TABS
12.5000 mg | ORAL_TABLET | Freq: Four times a day (QID) | ORAL | Status: DC | PRN
  Administered 2015-04-25 – 2015-04-26 (×4): 12.5 mg via ORAL
  Filled 2015-04-25 (×4): qty 1

## 2015-04-25 MED ORDER — HYDROMORPHONE HCL 1 MG/ML IJ SOLN
0.2500 mg | INTRAMUSCULAR | Status: DC | PRN
  Administered 2015-04-25 (×2): 0.25 mg via INTRAVENOUS
  Administered 2015-04-25: 0.5 mg via INTRAVENOUS

## 2015-04-25 MED ORDER — 0.9 % SODIUM CHLORIDE (POUR BTL) OPTIME
TOPICAL | Status: DC | PRN
  Administered 2015-04-25: 1000 mL

## 2015-04-25 MED ORDER — NEOSTIGMINE METHYLSULFATE 10 MG/10ML IV SOLN
INTRAVENOUS | Status: DC | PRN
  Administered 2015-04-25: 4 mg via INTRAVENOUS

## 2015-04-25 MED ORDER — CEFAZOLIN SODIUM-DEXTROSE 2-3 GM-% IV SOLR
INTRAVENOUS | Status: AC
  Filled 2015-04-25: qty 50

## 2015-04-25 MED ORDER — ONDANSETRON HCL 4 MG/2ML IJ SOLN
INTRAMUSCULAR | Status: DC | PRN
  Administered 2015-04-25: 4 mg via INTRAVENOUS

## 2015-04-25 MED ORDER — LIDOCAINE HCL (CARDIAC) 20 MG/ML IV SOLN
INTRAVENOUS | Status: DC | PRN
  Administered 2015-04-25: 50 mg via INTRAVENOUS

## 2015-04-25 MED ORDER — ALPRAZOLAM 0.5 MG PO TABS
0.5000 mg | ORAL_TABLET | Freq: Every day | ORAL | Status: DC | PRN
  Administered 2015-04-25 – 2015-04-26 (×2): 0.5 mg via ORAL
  Filled 2015-04-25 (×2): qty 1

## 2015-04-25 MED ORDER — MIDAZOLAM HCL 5 MG/5ML IJ SOLN
INTRAMUSCULAR | Status: DC | PRN
  Administered 2015-04-25: 2 mg via INTRAVENOUS

## 2015-04-25 MED ORDER — CEFAZOLIN SODIUM-DEXTROSE 2-3 GM-% IV SOLR
INTRAVENOUS | Status: DC | PRN
  Administered 2015-04-25: 2 g via INTRAVENOUS

## 2015-04-25 SURGICAL SUPPLY — 42 items
BENZOIN TINCTURE PRP APPL 2/3 (GAUZE/BANDAGES/DRESSINGS) ×2 IMPLANT
BLADE SURG 15 STRL LF DISP TIS (BLADE) IMPLANT
BLADE SURG 15 STRL SS (BLADE)
CANISTER SUCTION 2500CC (MISCELLANEOUS) ×2 IMPLANT
CLEANER TIP ELECTROSURG 2X2 (MISCELLANEOUS) ×2 IMPLANT
CONT SPEC 4OZ CLIKSEAL STRL BL (MISCELLANEOUS) IMPLANT
CORDS BIPOLAR (ELECTRODE) ×2 IMPLANT
COVER SURGICAL LIGHT HANDLE (MISCELLANEOUS) ×2 IMPLANT
CRADLE DONUT ADULT HEAD (MISCELLANEOUS) ×2 IMPLANT
DRAIN JACKSON RD 7FR 3/32 (WOUND CARE) ×2 IMPLANT
DRAIN SNY 10 ROU (WOUND CARE) IMPLANT
ELECT COATED BLADE 2.86 ST (ELECTRODE) ×2 IMPLANT
ELECT REM PT RETURN 9FT ADLT (ELECTROSURGICAL) ×2
ELECTRODE REM PT RTRN 9FT ADLT (ELECTROSURGICAL) ×1 IMPLANT
EVACUATOR SILICONE 100CC (DRAIN) ×2 IMPLANT
FORCEPS BIPOLAR SPETZLER 8 1.0 (NEUROSURGERY SUPPLIES) ×2 IMPLANT
GAUZE SPONGE 4X4 16PLY XRAY LF (GAUZE/BANDAGES/DRESSINGS) ×2 IMPLANT
GLOVE BIO SURGEON STRL SZ 6.5 (GLOVE) ×2 IMPLANT
GLOVE BIO SURGEON STRL SZ7.5 (GLOVE) ×2 IMPLANT
GOWN STRL REUS W/ TWL LRG LVL3 (GOWN DISPOSABLE) ×2 IMPLANT
GOWN STRL REUS W/ TWL XL LVL3 (GOWN DISPOSABLE) ×1 IMPLANT
GOWN STRL REUS W/TWL LRG LVL3 (GOWN DISPOSABLE) ×2
GOWN STRL REUS W/TWL XL LVL3 (GOWN DISPOSABLE) ×1
KIT BASIN OR (CUSTOM PROCEDURE TRAY) ×2 IMPLANT
KIT ROOM TURNOVER OR (KITS) ×2 IMPLANT
LIQUID BAND (GAUZE/BANDAGES/DRESSINGS) ×2 IMPLANT
LOCATOR NERVE 3 VOLT (DISPOSABLE) IMPLANT
NEEDLE HYPO 25GX1X1/2 BEV (NEEDLE) ×2 IMPLANT
NS IRRIG 1000ML POUR BTL (IV SOLUTION) ×2 IMPLANT
PAD ARMBOARD 7.5X6 YLW CONV (MISCELLANEOUS) ×4 IMPLANT
PENCIL BUTTON HOLSTER BLD 10FT (ELECTRODE) ×2 IMPLANT
SHEARS HARMONIC 9CM CVD (BLADE) ×2 IMPLANT
SPONGE INTESTINAL PEANUT (DISPOSABLE) ×2 IMPLANT
STAPLER VISISTAT 35W (STAPLE) ×2 IMPLANT
SUT ETHILON 2 0 FS 18 (SUTURE) ×2 IMPLANT
SUT SILK 3 0 REEL (SUTURE) ×2 IMPLANT
SUT VIC AB 3-0 SH 27 (SUTURE) ×1
SUT VIC AB 3-0 SH 27X BRD (SUTURE) ×1 IMPLANT
SUT VICRYL 4-0 PS2 18IN ABS (SUTURE) ×2 IMPLANT
TOWEL OR 17X24 6PK STRL BLUE (TOWEL DISPOSABLE) ×2 IMPLANT
TRAY ENT MC OR (CUSTOM PROCEDURE TRAY) ×2 IMPLANT
TUBE FEEDING 10FR FLEXIFLO (MISCELLANEOUS) IMPLANT

## 2015-04-25 NOTE — Anesthesia Procedure Notes (Addendum)
Procedure Name: Intubation Date/Time: 04/25/2015 8:05 AM Performed by: [name] A Pre-anesthesia Checklist: Patient identified, Timeout performed, Emergency Drugs available, Suction available and Patient being monitored Patient Re-evaluated:Patient Re-evaluated prior to inductionOxygen Delivery Method: Circle system utilized Preoxygenation: Pre-oxygenation with 100% oxygen Intubation Type: IV induction and Cricoid Pressure applied Ventilation: Mask ventilation without difficulty Laryngoscope Size: Mac and 4 Grade View: Grade II Tube type: Oral Tube size: 7.5 mm Number of attempts: 1 Airway Equipment and Method: Stylet and LTA kit utilized Placement Confirmation: ETT inserted through vocal cords under direct vision,  breath sounds checked- equal and bilateral and positive ETCO2 Secured at: 22 cm Tube secured with: Tape Dental Injury: Teeth and Oropharynx as per pre-operative assessment

## 2015-04-25 NOTE — Brief Op Note (Signed)
04/25/2015  9:43 AM  PATIENT:  Alison Wilkinson  45 y.o. female  PRE-OPERATIVE DIAGNOSIS:  hyperthyroidism  POST-OPERATIVE DIAGNOSIS:  hyperthyroidism  PROCEDURE:  Procedure(s) with comments: THYROIDECTOMY (Bilateral) - TOTAL THYROIDECTOMY  SURGEON:  Surgeon(s) and Role:    * Melida Quitter, MD - Primary  PHYSICIAN ASSISTANT:  Sallee Provencal  ASSISTANTS: none   ANESTHESIA:   general  EBL:  Total I/O In: 500 [I.V.:500] Out: -   BLOOD ADMINISTERED:none  DRAINS: (7 Fr) Jackson-Pratt drain(s) with closed bulb suction in the thyroid bed   LOCAL MEDICATIONS USED:  LIDOCAINE   SPECIMEN:  Source of Specimen:  total thyroid  DISPOSITION OF SPECIMEN:  PATHOLOGY  COUNTS:  YES  TOURNIQUET:  * No tourniquets in log *  DICTATION: .Other Dictation: Dictation Number [id number]  PLAN OF CARE: Admit for overnight observation  PATIENT DISPOSITION:  PACU - hemodynamically stable.   Delay start of Pharmacological VTE agent (>24hrs) due to surgical blood loss or risk of bleeding: yes

## 2015-04-25 NOTE — Anesthesia Postprocedure Evaluation (Signed)
Anesthesia Post Note  Patient: Alison Wilkinson  Procedure(s) Performed: Procedure(s) (LRB): THYROIDECTOMY (Bilateral)  Patient location during evaluation: PACU Anesthesia Type: General Level of consciousness: sedated and patient cooperative Pain management: pain level controlled Vital Signs Assessment: post-procedure vital signs reviewed and stable Respiratory status: spontaneous breathing Cardiovascular status: stable Anesthetic complications: no    Last Vitals:  Filed Vitals:   04/25/15 1100 04/25/15 1132  BP: 158/91 142/83  Pulse: 62 64  Temp: 36.3 C 36.4 C  Resp: 15 14    Last Pain:  Filed Vitals:   04/25/15 1341  PainSc: [place]

## 2015-04-25 NOTE — Progress Notes (Signed)
Patient ID: Alison Wilkinson, female   DOB: 1971-03-22, 45 y.o.   MRN: [id number] Some pain at thyroid site but doing well otherwise. AF VSS Alert, NAD Neck incision clean and intact, no fluid collection, drain functioning.  Normal voice. Calcium 8.6.  No drain output measured yet. A/P: Hyerthyroidism s/p total thyroidectomy Doing well.  Observe overnight with drain in place.  Monitor calcium.

## 2015-04-25 NOTE — H&P (Signed)
Alison Wilkinson is an 45 y.o. female.   Chief Complaint: hyperthyroidism HPI: 45 year old female with hyperthyroidism for 10 years or longer but recognized for the past year.  Medical therapy to control the thyroid level has not been very effective.  She does not want to undergo radioactive iodine therapy so she presents for thyroidectomy.  Past Medical History  Diagnosis Date  . A-fib ([place])   . Syncope   . High cholesterol   . Headache   . SSS (sick sinus syndrome) ([place])   . Cardiomyopathy ([place])   . PONV (postoperative nausea and vomiting)   . Stroke [facility])     2009   afib  (none in years)  . Pacemaker     biotronic  PPM   DR. [place]   . Dysrhythmia   . Hypertension   . Shortness of breath dyspnea     WITH EXERTION   . Hyperthyroidism   . GERD (gastroesophageal reflux disease)     Past Surgical History  Procedure Laterality Date  . Pacemaker insertion    . Cardiac catheterization      8/16  . Tumor removal      UTERUS    History reviewed. No pertinent family history. Social History:  reports that she has been smoking Cigarettes.  She has been smoking about 1.00 pack per day. She has never used smokeless tobacco. She reports that she does not drink alcohol or use illicit drugs.  Allergies:  Allergies  Allergen Reactions  . Carvedilol Palpitations and Shortness Of Breath    Per pt report    Medications Prior to Admission  Medication Sig Dispense Refill  . ALPRAZolam (XANAX) 1 MG tablet Take 0.5 mg by mouth daily as needed for anxiety.    . famotidine (PEPCID) 20 MG tablet Take 20 mg by mouth 2 (two) times daily.    . methimazole (TAPAZOLE) 10 MG tablet Take 20 mg by mouth 2 (two) times daily.     . metoprolol tartrate (LOPRESSOR) 25 MG tablet Take 50 mg by mouth daily.     . Promethazine HCl (PHENERGAN PO) Take by mouth.    [name] albuterol (PROVENTIL) (2.5 MG/3ML) 0.083% nebulizer solution Take 2.5 mg by nebulization every 6 (six) hours as needed for  wheezing or shortness of breath.      No results found for this or any previous visit (from the past 48 hour(s)). No results found.  Review of Systems  HENT: Positive for sore throat.   All other systems reviewed and are negative.   Blood pressure 135/86, pulse 78, temperature 97.4 F (36.3 C), temperature source Oral, resp. rate 20, height 5\' 5"  (1.651 m), weight 103.42 kg (228 lb), last menstrual period 04/18/2015, SpO2 98 %. Physical Exam  Constitutional: She is oriented to person, place, and time. She appears well-developed and well-nourished. No distress.  HENT:  Head: Normocephalic and atraumatic.  Right Ear: External ear normal.  Left Ear: External ear normal.  Nose: Nose normal.  Mouth/Throat: Oropharynx is clear and moist.  Eyes: Conjunctivae and EOM are normal. Pupils are equal, round, and reactive to light.  Neck: Normal range of motion. Neck supple.  Cardiovascular: Normal rate.   Respiratory: Effort normal.  Musculoskeletal: Normal range of motion.  Neurological: She is alert and oriented to person, place, and time. No cranial nerve deficit.  Skin: Skin is warm and dry.  Psychiatric: She has a normal mood and affect. Her behavior is normal. Judgment and thought  content normal.     Assessment/Plan Hyperthyroidism To OR for total thyroidectomy.  Plan overnight observation with drain and calcium monitoring.  Kylan Liberati 04/25/2015, 7:25 AM

## 2015-04-25 NOTE — Progress Notes (Signed)
Alison Wilkinson with [employer] called back, reports that he will be on campus in about 10 minutes and to have a magnet in the [facility] room. [name], CRNA informed.

## 2015-04-25 NOTE — Progress Notes (Signed)
Dr. Joneen Boers requested [employer] rep be paged for interrogation which was done.

## 2015-04-25 NOTE — Op Note (Signed)
NAMEMANYAH, RAPPAPORT NO.:  [id number]  MEDICAL RECORD NO.:  [id number]  LOCATION:  [facility]                         FACILITY:  [facility]  PHYSICIAN:  Onnie Graham, MD     DATE OF BIRTH:  02-04-1971  DATE OF PROCEDURE:  04/25/2015 DATE OF DISCHARGE:                              OPERATIVE REPORT   PREOPERATIVE DIAGNOSIS:  Hyperthyroidism.  POSTOPERATIVE DIAGNOSIS:  Hyperthyroidism.  PROCEDURE:  Total thyroidectomy.  SURGEON:  Leane Para. Redmond Baseman, MD  ANESTHESIA:  General endotracheal anesthesia.  COMPLICATIONS:  None.  INDICATION:  The patient is a 45 year old female with symptoms of hyperthyroidism for may be a decade, but more pronounced in the last year.  Medical therapy has not controlled function.  She has elected surgical removal over radioactive iodine therapy and presents to the operating room for surgical management.  FINDINGS:  Thyroid was fairly nondescript on examination and was sent en bloc to the pathologist.  The patient's hemodynamic status was stable throughout the case.  DESCRIPTION OF PROCEDURE:  The patient was identified in the holding room and informed consent having been obtained including discussion of risks, benefits, alternatives, the patient was brought to the operative suite, and put on the operative table in supine position.  Anesthesia was induced and the patient was intubated by anesthesia team without difficulty.  The patient was given intravenous antibiotics during the case.  The eyes were lubricated and taped closed and a shoulder roll was placed.  The neck incision was marked with a marking pen and injected with 1% lidocaine with 1:100,000 epinephrine.  The neck was prepped and draped in sterile fashion.  Incision was made with a 15 blade scalpel and extended through subcutaneous tissues and platysma layer using Bovie electrocautery.  Subplatysmal flaps were elevated superiorly and inferiorly and a self-retaining retractor was  added.  The midline raphe of the strap muscles were then divided down and dissection was then performed over the left thyroid lobe separating the strap muscles from the lobe carefully.  Dissection was then performed along the capsule of the gland starting inferiorly and extending superiorly.  Parathyroid tissue was divided from the gland inferiorly for sure.  The superior tissue was also felt to be divided off the gland as well.  The Harmonic Scalpel was used to help separate the gland from surrounding tissues and to divide the vessels including the superior pedicle.  Careful dissection was then performed around the lateral edge of the gland and around the deep surface.  The recurrent laryngeal nerve was identified and dissection kept free of the nerve.  The gland was further dissected to various ligament and then toward the midline.  The left lobe was then placed back into its position and dissection was then performed around the right side in the same fashion including careful dissection around the capsule separating parathyroid tissue inferiorly for certain and dividing vessels including the superior pedicle using the Harmonic Scalpel primarily.  The recurrent nerve was seen in the tissues on this side as well and kept free from the dissection which was dissected then around underneath the gland to Berry's ligament.  The full gland was then removed and passed to nursing  for Pathology.  Bleeding was controlled with bipolar electrocautery.  The surgical bed was copiously irrigated with saline.  The patient given a Valsalva and there was no additional bleeding seen.  A 7-French round drain was placed in the depths of the wound and secured to the skin using 2-0 nylon suture in a standard drain stitch.  The drain was hooked to suction during closure. The midline raphe was closed with 3-0 Vicryl suture in a simple interrupted fashion as was the platysma layer.  Subcutaneous layer was closed  with 4-0 Vicryl suture in a simple interrupted fashion and the skin was closed with skin glue.  Drapes were removed.  The patient was cleaned off.  The drain was taped to the left shoulder and placed to bulb suction.  She was then returned to anesthesia for wake-up, was extubated and moved to recovery room in stable condition.     Onnie Graham, MD     DDB/MEDQ  D:  04/25/2015  T:  04/25/2015  Job:  [id number]

## 2015-04-25 NOTE — Transfer of Care (Signed)
Immediate Anesthesia Transfer of Care Note  Patient: Alison Wilkinson  Procedure(s) Performed: Procedure(s) with comments: THYROIDECTOMY (Bilateral) - TOTAL THYROIDECTOMY  Patient Location: PACU  Anesthesia Type:General  Level of Consciousness: awake, oriented, sedated, patient cooperative and responds to stimulation  Airway & Oxygen Therapy: Patient Spontanous Breathing and Patient connected to nasal cannula oxygen  Post-op Assessment: Report given to RN, Post -op Vital signs reviewed and stable, Patient moving all extremities and Patient moving all extremities X 4  Post vital signs: Reviewed and stable  Last Vitals:  Filed Vitals:   04/25/15 0551 04/25/15 1003  BP: 135/86 149/91  Pulse: 78 63  Temp: 36.3 C 36.7 C  Resp: 20 19    Complications: No apparent anesthesia complications

## 2015-04-26 DIAGNOSIS — E0590 Thyrotoxicosis, unspecified without thyrotoxic crisis or storm: Secondary | ICD-10-CM | POA: Diagnosis not present

## 2015-04-26 LAB — CALCIUM: Calcium: 8.1 mg/dL — ABNORMAL LOW (ref 8.9–10.3)

## 2015-04-26 MED ORDER — LEVOTHYROXINE SODIUM 150 MCG PO TABS: 150.0000 ug | ORAL_TABLET | Freq: Every day | ORAL | Status: DC

## 2015-04-26 MED ORDER — HYDROCODONE-ACETAMINOPHEN 5-325 MG PO TABS: 1.0000 | ORAL_TABLET | ORAL | Status: DC | PRN

## 2015-04-26 NOTE — Discharge Summary (Signed)
Physician Discharge Summary  Patient ID: Alison Wilkinson MRN: [id number] DOB/AGE: Aug 15, 1970 45 y.o.  Admit date: 04/25/2015 Discharge date: 04/26/2015  Admission Diagnoses: Hyperthyroidism  Discharge Diagnoses:  Active Problems:   Hyperthyroidism   Discharged Condition: good  Hospital Course: 45 year old female with hyperthyroidism not responding well to medical therapy.  She wished to proceed with thyroidectomy.  See operative note.  She was observed overnight and did well with only some pain that was controlled with medication.  Calcium remained stable and drain output was scant.  On POD 1, the drain was removed and she was felt stable for discharge.  Consults: None  Significant Diagnostic Studies: None  Treatments: surgery: Total thyroidectomy  Discharge Exam: Blood pressure 111/68, pulse 63, temperature 97.8 F (36.6 C), temperature source Oral, resp. rate 16, height 5\' 5"  (1.651 m), weight 103.42 kg (228 lb), last menstrual period 04/18/2015, SpO2 98 %. General appearance: alert, cooperative and no distress Neck: thyroidectomy incision clean and intact, mild skin puffiness superior to incision, no fluid collection, drain removed.  Normal voice.  Disposition: 01-Home or Self Care  Discharge Instructions    Diet - low sodium heart healthy    Complete by:  As directed      Discharge instructions    Complete by:  As directed   Avoid strenuous activity.  Normal diet.  Do not apply any ointment to the incision.  OK to allow it to get wet, gently pat dry.  Change dry dressing over drain site until no more drainage.     Increase activity slowly    Complete by:  As directed             Medication List    TAKE these medications        albuterol (2.5 MG/3ML) 0.083% nebulizer solution  Commonly known as:  PROVENTIL  Take 2.5 mg by nebulization every 6 (six) hours as needed for wheezing or shortness of breath.     ALPRAZolam 1 MG tablet  Commonly known as:  XANAX  Take 0.5  mg by mouth daily as needed for anxiety.     famotidine 20 MG tablet  Commonly known as:  PEPCID  Take 20 mg by mouth 2 (two) times daily.     HYDROcodone-acetaminophen 5-325 MG tablet  Commonly known as:  NORCO/VICODIN  Take 1-2 tablets by mouth every 4 (four) hours as needed for moderate pain.     levothyroxine 150 MCG tablet  Commonly known as:  SYNTHROID, LEVOTHROID  Take 1 tablet (150 mcg total) by mouth daily before breakfast.     methimazole 10 MG tablet  Commonly known as:  TAPAZOLE  Take 20 mg by mouth 2 (two) times daily.     metoprolol tartrate 25 MG tablet  Commonly known as:  LOPRESSOR  Take 50 mg by mouth daily.     PHENERGAN PO  Take by mouth.           Follow-up Information    Follow up with Easter Schinke, MD. Schedule an appointment as soon as possible for a visit in 1 week.   Specialty:  Otolaryngology   Contact information:   [address] [place] [address] [phone]       Signed: Melida Wilkinson 04/26/2015, 8:38 AM

## 2015-04-28 ENCOUNTER — Encounter (HOSPITAL_COMMUNITY): Payer: Self-pay | Admitting: Emergency Medicine

## 2015-04-28 ENCOUNTER — Observation Stay (HOSPITAL_COMMUNITY)
Admission: EM | Admit: 2015-04-28 | Discharge: 2015-04-29 | Disposition: A | Discharge status: Home / Self Care | Payer: Medicaid Other | Attending: Internal Medicine | Admitting: Internal Medicine

## 2015-04-28 DIAGNOSIS — I429 Cardiomyopathy, unspecified: Secondary | ICD-10-CM | POA: Insufficient documentation

## 2015-04-28 DIAGNOSIS — R202 Paresthesia of skin: Secondary | ICD-10-CM | POA: Insufficient documentation

## 2015-04-28 DIAGNOSIS — F17210 Nicotine dependence, cigarettes, uncomplicated: Secondary | ICD-10-CM | POA: Diagnosis not present

## 2015-04-28 DIAGNOSIS — I4891 Unspecified atrial fibrillation: Secondary | ICD-10-CM | POA: Diagnosis not present

## 2015-04-28 DIAGNOSIS — E7800 Pure hypercholesterolemia, unspecified: Secondary | ICD-10-CM | POA: Diagnosis not present

## 2015-04-28 DIAGNOSIS — K219 Gastro-esophageal reflux disease without esophagitis: Secondary | ICD-10-CM | POA: Insufficient documentation

## 2015-04-28 DIAGNOSIS — Z79899 Other long term (current) drug therapy: Secondary | ICD-10-CM | POA: Insufficient documentation

## 2015-04-28 DIAGNOSIS — I495 Sick sinus syndrome: Secondary | ICD-10-CM | POA: Diagnosis not present

## 2015-04-28 DIAGNOSIS — Z9889 Other specified postprocedural states: Secondary | ICD-10-CM | POA: Insufficient documentation

## 2015-04-28 DIAGNOSIS — Z8673 Personal history of transient ischemic attack (TIA), and cerebral infarction without residual deficits: Secondary | ICD-10-CM | POA: Insufficient documentation

## 2015-04-28 DIAGNOSIS — I428 Other cardiomyopathies: Secondary | ICD-10-CM

## 2015-04-28 DIAGNOSIS — E0590 Thyrotoxicosis, unspecified without thyrotoxic crisis or storm: Secondary | ICD-10-CM | POA: Diagnosis not present

## 2015-04-28 DIAGNOSIS — I499 Cardiac arrhythmia, unspecified: Secondary | ICD-10-CM | POA: Insufficient documentation

## 2015-04-28 DIAGNOSIS — E890 Postprocedural hypothyroidism: Secondary | ICD-10-CM

## 2015-04-28 DIAGNOSIS — R200 Anesthesia of skin: Secondary | ICD-10-CM | POA: Diagnosis present

## 2015-04-28 DIAGNOSIS — I10 Essential (primary) hypertension: Secondary | ICD-10-CM | POA: Diagnosis present

## 2015-04-28 DIAGNOSIS — R252 Cramp and spasm: Secondary | ICD-10-CM | POA: Insufficient documentation

## 2015-04-28 LAB — BASIC METABOLIC PANEL
ANION GAP: 11 (ref 5–15)
BUN: 11 mg/dL (ref 6–20)
CO2: 28 mmol/L (ref 22–32)
Calcium: 6.8 mg/dL — ABNORMAL LOW (ref 8.9–10.3)
Chloride: 99 mmol/L — ABNORMAL LOW (ref 101–111)
Creatinine, Ser: 0.9 mg/dL (ref 0.44–1.00)
Glucose, Bld: 107 mg/dL — ABNORMAL HIGH (ref 65–99)
POTASSIUM: 3.7 mmol/L (ref 3.5–5.1)
SODIUM: 138 mmol/L (ref 135–145)

## 2015-04-28 LAB — CBC WITH DIFFERENTIAL/PLATELET
BASOS ABS: 0 10*3/uL (ref 0.0–0.1)
Basophils Relative: 0 %
EOS PCT: 2 %
Eosinophils Absolute: 0.2 10*3/uL (ref 0.0–0.7)
HCT: 41.4 % (ref 36.0–46.0)
HEMOGLOBIN: 13.7 g/dL (ref 12.0–15.0)
LYMPHS PCT: 33 %
Lymphs Abs: 2.6 10*3/uL (ref 0.7–4.0)
MCH: 31.6 pg (ref 26.0–34.0)
MCHC: 33.1 g/dL (ref 30.0–36.0)
MCV: 95.6 fL (ref 78.0–100.0)
Monocytes Absolute: 0.6 10*3/uL (ref 0.1–1.0)
Monocytes Relative: 8 %
NEUTROS ABS: 4.4 10*3/uL (ref 1.7–7.7)
NEUTROS PCT: 57 %
PLATELETS: 247 10*3/uL (ref 150–400)
RBC: 4.33 MIL/uL (ref 3.87–5.11)
RDW: 14.2 % (ref 11.5–15.5)
WBC: 7.8 10*3/uL (ref 4.0–10.5)

## 2015-04-28 LAB — MAGNESIUM: Magnesium: 1.6 mg/dL — ABNORMAL LOW (ref 1.7–2.4)

## 2015-04-28 LAB — HEPATIC FUNCTION PANEL
ALT: 15 U/L (ref 14–54)
AST: 24 U/L (ref 15–41)
Albumin: 3.3 g/dL — ABNORMAL LOW (ref 3.5–5.0)
Alkaline Phosphatase: 71 U/L (ref 38–126)
Total Bilirubin: 0.5 mg/dL (ref 0.3–1.2)
Total Protein: 6.3 g/dL — ABNORMAL LOW (ref 6.5–8.1)

## 2015-04-28 MED ORDER — SODIUM CHLORIDE 0.9 % IV SOLN
1.0000 g | Freq: Once | INTRAVENOUS | Status: AC
  Administered 2015-04-28: 1 g via INTRAVENOUS
  Filled 2015-04-28: qty 10

## 2015-04-28 MED ORDER — ONDANSETRON HCL 4 MG/2ML IJ SOLN
4.0000 mg | Freq: Four times a day (QID) | INTRAMUSCULAR | Status: DC | PRN
  Administered 2015-04-28: 4 mg via INTRAVENOUS
  Filled 2015-04-28: qty 2

## 2015-04-28 MED ORDER — SENNOSIDES-DOCUSATE SODIUM 8.6-50 MG PO TABS: 1.0000 | ORAL_TABLET | Freq: Every evening | ORAL | Status: DC | PRN

## 2015-04-28 MED ORDER — ENOXAPARIN SODIUM 60 MG/0.6ML ~~LOC~~ SOLN
50.0000 mg | SUBCUTANEOUS | Status: DC
  Administered 2015-04-28: 50 mg via SUBCUTANEOUS
  Filled 2015-04-28: qty 0.6

## 2015-04-28 MED ORDER — ALPRAZOLAM 0.5 MG PO TABS
0.5000 mg | ORAL_TABLET | Freq: Three times a day (TID) | ORAL | Status: DC | PRN
  Administered 2015-04-28: 0.5 mg via ORAL
  Filled 2015-04-28: qty 1

## 2015-04-28 MED ORDER — FAMOTIDINE 20 MG PO TABS
20.0000 mg | ORAL_TABLET | Freq: Two times a day (BID) | ORAL | Status: DC
  Administered 2015-04-28 – 2015-04-29 (×2): 20 mg via ORAL
  Filled 2015-04-28 (×2): qty 1

## 2015-04-28 MED ORDER — LEVOTHYROXINE SODIUM 75 MCG PO TABS
150.0000 ug | ORAL_TABLET | Freq: Every day | ORAL | Status: DC
  Administered 2015-04-29: 150 ug via ORAL
  Filled 2015-04-28: qty 2

## 2015-04-28 MED ORDER — ACETAMINOPHEN 650 MG RE SUPP: 650.0000 mg | Freq: Four times a day (QID) | RECTAL | Status: DC | PRN

## 2015-04-28 MED ORDER — ONDANSETRON HCL 4 MG PO TABS: 4.0000 mg | ORAL_TABLET | Freq: Four times a day (QID) | ORAL | Status: DC | PRN

## 2015-04-28 MED ORDER — CALCIUM CARBONATE 1250 (500 CA) MG PO TABS
2.0000 | ORAL_TABLET | Freq: Two times a day (BID) | ORAL | Status: DC
  Administered 2015-04-28 – 2015-04-29 (×2): 1000 mg via ORAL
  Filled 2015-04-28 (×2): qty 1

## 2015-04-28 MED ORDER — METOPROLOL SUCCINATE ER 25 MG PO TB24
25.0000 mg | ORAL_TABLET | Freq: Every day | ORAL | Status: DC
  Administered 2015-04-29: 25 mg via ORAL
  Filled 2015-04-28: qty 1

## 2015-04-28 MED ORDER — SODIUM CHLORIDE 0.9 % IV SOLN
2.0000 g | Freq: Once | INTRAVENOUS | Status: AC
  Administered 2015-04-28: 2 g via INTRAVENOUS
  Filled 2015-04-28: qty 20

## 2015-04-28 MED ORDER — ACETAMINOPHEN 325 MG PO TABS: 650.0000 mg | ORAL_TABLET | Freq: Four times a day (QID) | ORAL | Status: DC | PRN

## 2015-04-28 MED ORDER — SODIUM CHLORIDE 0.9 % IV SOLN
INTRAVENOUS | Status: DC
  Administered 2015-04-28 – 2015-04-29 (×2): via INTRAVENOUS

## 2015-04-28 MED ORDER — MAGNESIUM SULFATE 2 GM/50ML IV SOLN
2.0000 g | Freq: Once | INTRAVENOUS | Status: AC
  Administered 2015-04-28: 2 g via INTRAVENOUS
  Filled 2015-04-28: qty 50

## 2015-04-28 NOTE — ED Provider Notes (Signed)
CSN: [id number]     Arrival date & time 04/28/15  1102 History   First MD Initiated Contact with Patient 04/28/15 1210     Chief Complaint  Patient presents with  . Post-op Problem      HPI Pt was seen at 1215. Per pt, c/o gradual onset and worsening of persistent "tingling and numbness all over" since yesterday. Has been associated with bilat LE's muscles "cramping." Pt is s/p thyroidectomy 04/25/15. Pt states she called her ENT Dr. Redmond Baseman, and was told to start to take PO calcium. Pt has been taking PO calcium since yesterday without improvement of her symptoms. Denies CP/palpitations, no SOB/cough, no abd pain, no N/V/D, no fevers, no rash.    Past Medical History  Diagnosis Date  . A-fib ([place])   . Syncope   . High cholesterol   . Headache   . SSS (sick sinus syndrome) ([place])   . Cardiomyopathy ([place])   . PONV (postoperative nausea and vomiting)   . Stroke [facility])     2009   afib  (none in years)  . Pacemaker     biotronic  PPM   DR. [place]   . Dysrhythmia   . Hypertension   . Shortness of breath dyspnea     WITH EXERTION   . Hyperthyroidism   . GERD (gastroesophageal reflux disease)    Past Surgical History  Procedure Laterality Date  . Pacemaker insertion    . Cardiac catheterization      8/16  . Tumor removal      UTERUS  . Thyroidectomy Bilateral 04/25/2015    Procedure: THYROIDECTOMY;  Surgeon: Melida Quitter, MD;  Location: [facility];  Service: ENT;  Laterality: Bilateral;  TOTAL THYROIDECTOMY    Social History  Substance Use Topics  . Smoking status: Current Every Day Smoker -- 1.00 packs/day    Types: Cigarettes  . Smokeless tobacco: Never Used  . Alcohol Use: No     Comment: NONE IN 6 YEARS    Review of Systems ROS: Statement: All systems negative except as marked or noted in the HPI; Constitutional: Negative for fever and chills. ; ; Eyes: Negative for eye pain, redness and discharge. ; ; ENMT: Negative for ear pain, hoarseness, nasal congestion,  sinus pressure and sore throat. ; ; Cardiovascular: Negative for chest pain, palpitations, diaphoresis, dyspnea and peripheral edema. ; ; Respiratory: Negative for cough, wheezing and stridor. ; ; Gastrointestinal: Negative for nausea, vomiting, diarrhea, abdominal pain, blood in stool, hematemesis, jaundice and rectal bleeding. . ; ; Genitourinary: Negative for dysuria, flank pain and hematuria. ; ; Musculoskeletal: Negative for back pain and neck pain. Negative for swelling and trauma.; ; Skin: Negative for pruritus, rash, abrasions, blisters, bruising and skin lesion.; ; Neuro: +tingling, numbness "all over." Negative for headache, lightheadedness and neck stiffness. Negative for weakness, altered level of consciousness , altered mental status, extremity weakness, involuntary movement, seizure and syncope.      Allergies  Carvedilol  Home Medications   Prior to Admission medications   Medication Sig Start Date End Date Taking? Authorizing Provider  ALPRAZolam Duanne Moron) 1 MG tablet Take 0.5 mg by mouth daily as needed for anxiety.   Yes Historical Provider, MD  calcium carbonate (OS-CAL - DOSED IN MG OF ELEMENTAL CALCIUM) 1250 (500 Ca) MG tablet Take 1 tablet by mouth daily with breakfast.   Yes Historical Provider, MD  famotidine (PEPCID) 20 MG tablet Take 20 mg by mouth 2 (two) times daily.  Yes Historical Provider, MD  HYDROcodone-acetaminophen (NORCO/VICODIN) 5-325 MG tablet Take 1-2 tablets by mouth every 4 (four) hours as needed for moderate pain. 04/26/15  Yes Melida Quitter, MD  levothyroxine (SYNTHROID, LEVOTHROID) 150 MCG tablet Take 1 tablet (150 mcg total) by mouth daily before breakfast. 04/26/15  Yes Melida Quitter, MD  metoprolol succinate (TOPROL-XL) 25 MG 24 hr tablet Take 1 tablet by mouth daily.   Yes Historical Provider, MD  promethazine (PHENERGAN) 25 MG tablet Take 12.5 mg by mouth every 6 (six) hours as needed for nausea or vomiting.   Yes Historical Provider, MD   BP 154/95 mmHg   Pulse 76  Temp(Src) 99 F (37.2 C) (Oral)  Resp 14  Ht 5\' 5"  (1.651 m)  Wt 230 lb (104.327 kg)  BMI 38.27 kg/m2  SpO2 100%  LMP 04/17/2015 Physical Exam 1220: Physical examination:  Nursing notes reviewed; Vital signs and O2 SAT reviewed;  Constitutional: Well developed, Well nourished, Well hydrated, In no acute distress; Head:  Normocephalic, atraumatic; Eyes: EOMI, PERRL, No scleral icterus; ENMT: Mouth and pharynx normal, Mucous membranes moist; Neck: Supple, Full range of motion, No lymphadenopathy; Cardiovascular: Regular rate and rhythm, No murmur, rub, or gallop; Respiratory: Breath sounds clear & equal bilaterally, No rales, rhonchi, wheezes.  Speaking full sentences with ease, Normal respiratory effort/excursion; Chest: Nontender, Movement normal; Abdomen: Soft, Nontender, Nondistended, Normal bowel sounds; Genitourinary: No CVA tenderness; Extremities: Pulses normal, No deformity, No edema, No calf edema or asymmetry.; Neuro: AA&Ox3, Major CN grossly intact.  Speech clear. No gross focal motor or sensory deficits in extremities. +Chvostek's sign.; Skin: Color normal, Warm, Dry.   ED Course  Procedures (including critical care time) Labs Review  Imaging Review  I have personally reviewed and evaluated these images and lab results as part of my medical decision-making.   EKG Interpretation None      MDM  MDM Reviewed: previous chart, nursing note and vitals Reviewed previous: labs and ECG Interpretation: labs and ECG      ED ECG REPORT   Date: 04/28/2015  Rate: 74  Rhythm: normal sinus rhythm  QRS Axis: normal  Intervals: normal  ST/T Wave abnormalities: normal  Conduction Disutrbances:nonspecific intraventricular conduction delay  Narrative Interpretation:   Old EKG Reviewed: unchanged; no significant changes compared to previous EKG dated 03/24/2014.  Results for orders placed or performed during the hospital encounter of [id number]  Basic metabolic panel   Result Value Ref Range   Sodium 138 135 - 145 mmol/L   Potassium 3.7 3.5 - 5.1 mmol/L   Chloride 99 (L) 101 - 111 mmol/L   CO2 28 22 - 32 mmol/L   Glucose, Bld 107 (H) 65 - 99 mg/dL   BUN 11 6 - 20 mg/dL   Creatinine, Ser 0.90 0.44 - 1.00 mg/dL   Calcium 6.8 (L) 8.9 - 10.3 mg/dL   GFR calc non Af Amer >60 >60 mL/min   GFR calc Af Amer >60 >60 mL/min   Anion gap 11 5 - 15  CBC with Differential/Platelet  Result Value Ref Range   WBC 7.8 4.0 - 10.5 K/uL   RBC 4.33 3.87 - 5.11 MIL/uL   Hemoglobin 13.7 12.0 - 15.0 g/dL   HCT 41.4 36.0 - 46.0 %   MCV 95.6 78.0 - 100.0 fL   MCH 31.6 26.0 - 34.0 pg   MCHC 33.1 30.0 - 36.0 g/dL   RDW 14.2 11.5 - 15.5 %   Platelets 247 150 - 400 K/uL   Neutrophils Relative %  57 %   Neutro Abs 4.4 1.7 - 7.7 K/uL   Lymphocytes Relative 33 %   Lymphs Abs 2.6 0.7 - 4.0 K/uL   Monocytes Relative 8 %   Monocytes Absolute 0.6 0.1 - 1.0 K/uL   Eosinophils Relative 2 %   Eosinophils Absolute 0.2 0.0 - 0.7 K/uL   Basophils Relative 0 %   Basophils Absolute 0.0 0.0 - 0.1 K/uL  Hepatic function panel  Result Value Ref Range   Total Protein 6.3 (L) 6.5 - 8.1 g/dL   Albumin 3.3 (L) 3.5 - 5.0 g/dL   AST 24 15 - 41 U/L   ALT 15 14 - 54 U/L   Alkaline Phosphatase 71 38 - 126 U/L   Total Bilirubin 0.5 0.3 - 1.2 mg/dL   Bilirubin, Direct <0.1 (L) 0.1 - 0.5 mg/dL   Indirect Bilirubin NOT CALCULATED 0.3 - 0.9 mg/dL  Magnesium  Result Value Ref Range   Magnesium 1.6 (L) 1.7 - 2.4 mg/dL    1255:  Given pt's symptoms, IV calcium and IV magnesium ordered. Dx and testing d/w pt and family.  Questions answered.  Verb understanding, agreeable to observation admit. T/C to [facility] Dr. Jerilee Hoh, case discussed, including:  HPI, pertinent PM/SHx, VS/PE, dx testing, ED course and treatment:  Agreeable to admit, requests to write temporary orders, obtain observation medical bed to team APAdmits.      Francine Graven, DO 04/30/15 1309

## 2015-04-28 NOTE — Progress Notes (Signed)
Alison Wilkinson arrived to unit rm# [id number].  Report taken from [place], ED, [place].

## 2015-04-28 NOTE — ED Notes (Signed)
Pt reports numbness and tingling "everywhere".  Pt had thyroid removal on 04/25/15.  Pt has been taking calcium, but believes her levels are low. Pt also reporting bilateral leg cramping.

## 2015-04-28 NOTE — H&P (Signed)
             [facility]          History and Physical    PCP:   Samuel Bradshaw, MD   EDP: Kathleen McManus, M.D.  Chief Complaint:  Numbness and tingling all over  HPI: Patient is a 45-year-old woman with history of hypertension, hyperthyroidism status post recent total thyroidectomy on 1/27 presents to the hospital today with the above complaints. She states that starting yesterday she started to experience some tingling over her hands and feet. Today they have progressed to severe muscle cramps over her calves. She called Dr. Bates who performed her thyroidectomy and he recommended she use calcium supplementation, she felt that her symptoms are progressing today so she came into the hospital for evaluation. Her calcium level was found to be at 6.8 and thus was referred for admission.  Allergies:   Allergies  Allergen Reactions  . Carvedilol Palpitations and Shortness Of Breath    Per pt report      Past Medical History  Diagnosis Date  . A-fib (HCC)   . Syncope   . High cholesterol   . Headache   . SSS (sick sinus syndrome) (HCC)   . Cardiomyopathy (HCC)   . PONV (postoperative nausea and vomiting)   . Stroke (HCC)     2009   afib  (none in years)  . Pacemaker     biotronic  PPM   [facility]   . Dysrhythmia   . Hypertension   . Shortness of breath dyspnea     WITH EXERTION   . Hyperthyroidism   . GERD (gastroesophageal reflux disease)     Past Surgical History  Procedure Laterality Date  . Pacemaker insertion    . Cardiac catheterization      8/16  . Tumor removal      UTERUS  . Thyroidectomy Bilateral 04/25/2015    Procedure: THYROIDECTOMY;  Surgeon: Dwight Bates, MD;  Location: [facility] OR;  Service: ENT;  Laterality: Bilateral;  TOTAL THYROIDECTOMY    Prior to Admission medications   Medication Sig Start Date End Date Taking? Authorizing Provider  ALPRAZolam (XANAX) 1 MG tablet Take 0.5 mg by mouth daily as needed for anxiety.    Yes Historical Provider, MD  calcium carbonate (OS-CAL - DOSED IN MG OF ELEMENTAL CALCIUM) 1250 (500 Ca) MG tablet Take 1 tablet by mouth daily with breakfast.   Yes Historical Provider, MD  famotidine (PEPCID) 20 MG tablet Take 20 mg by mouth 2 (two) times daily.   Yes Historical Provider, MD  HYDROcodone-acetaminophen (NORCO/VICODIN) 5-325 MG tablet Take 1-2 tablets by mouth every 4 (four) hours as needed for moderate pain. 04/26/15  Yes Dwight Bates, MD  levothyroxine (SYNTHROID, LEVOTHROID) 150 MCG tablet Take 1 tablet (150 mcg total) by mouth daily before breakfast. 04/26/15  Yes Dwight Bates, MD  metoprolol succinate (TOPROL-XL) 25 MG 24 hr tablet Take 1 tablet by mouth daily.   Yes Historical Provider, MD  promethazine (PHENERGAN) 25 MG tablet Take 12.5 mg by mouth every 6 (six) hours as needed for nausea or vomiting.   Yes Historical Provider, MD    Social History:  reports that she has been smoking Cigarettes.  She has been smoking about 1.00 pack per day. She has never used smokeless tobacco. She reports that she does not drink alcohol or use illicit drugs.  History reviewed. No pertinent family history.  Review of Systems:  Constitutional: Denies   fever, chills, diaphoresis, appetite change and fatigue.  HEENT: Denies photophobia, eye pain, redness, hearing loss, ear pain, congestion, sore throat, rhinorrhea, sneezing, mouth sores, trouble swallowing, neck pain, neck stiffness and tinnitus.   Respiratory: Denies SOB, DOE, cough, chest tightness,  and wheezing.   Cardiovascular: Denies chest pain, palpitations and leg swelling.  Gastrointestinal: Denies nausea, vomiting, abdominal pain, diarrhea, constipation, blood in stool and abdominal distention.  Genitourinary: Denies dysuria, urgency, frequency, hematuria, flank pain and difficulty urinating.  Endocrine: Denies: hot or cold intolerance, sweats, changes in hair or nails, polyuria, polydipsia. Musculoskeletal: Denies myalgias, back  pain, joint swelling, arthralgias and gait problem.  Skin: Denies pallor, rash and wound.  Neurological: Denies dizziness, seizures, syncope, weakness, light-headedness, numbness and headaches.  Hematological: Denies adenopathy. Easy bruising, personal or family bleeding history  Psychiatric/Behavioral: Denies suicidal ideation, mood changes, confusion, nervousness, sleep disturbance and agitation   Physical Exam: Blood pressure 120/81, pulse 71, temperature 98.1 F (36.7 C), temperature source Oral, resp. rate 18, height 5' 5" (1.651 m), weight 104.191 kg (229 lb 11.2 oz), last menstrual period 04/17/2015, SpO2 98 %. General: Alert, awake, oriented 3 HEENT: Normocephalic, atraumatic, pupils equal round and reactive, extraocular movements intact Neck: Supple, no JVD, no lymphadenopathy, no bruits, no goiter. Cardiovascular: Regular rate and rhythm, no murmurs, rubs or gallops Lungs: Clear to auscultation bilaterally Abdomen: Soft, nontender, nondistended, positive bowel sounds Extremities: No clubbing, cyanosis or edema, positive pulses Neurologic: Grossly intact and nonfocal  Labs on Admission:  Results for orders placed or performed during the hospital encounter of 04/28/15 (from the past 48 hour(s))  Basic metabolic panel     Status: Abnormal   Collection Time: 04/28/15 11:29 AM  Result Value Ref Range   Sodium 138 135 - 145 mmol/L   Potassium 3.7 3.5 - 5.1 mmol/L   Chloride 99 (L) 101 - 111 mmol/L   CO2 28 22 - 32 mmol/L   Glucose, Bld 107 (H) 65 - 99 mg/dL   BUN 11 6 - 20 mg/dL   Creatinine, Ser 0.90 0.44 - 1.00 mg/dL   Calcium 6.8 (L) 8.9 - 10.3 mg/dL   GFR calc non Af Amer >60 >60 mL/min   GFR calc Af Amer >60 >60 mL/min    Comment: (NOTE) The eGFR has been calculated using the CKD EPI equation. This calculation has not been validated in all clinical situations. eGFR's persistently <60 mL/min signify possible Chronic Kidney Disease.    Anion gap 11 5 - 15  CBC with  Differential/Platelet     Status: None   Collection Time: 04/28/15 11:29 AM  Result Value Ref Range   WBC 7.8 4.0 - 10.5 K/uL   RBC 4.33 3.87 - 5.11 MIL/uL   Hemoglobin 13.7 12.0 - 15.0 g/dL   HCT 41.4 36.0 - 46.0 %   MCV 95.6 78.0 - 100.0 fL   MCH 31.6 26.0 - 34.0 pg   MCHC 33.1 30.0 - 36.0 g/dL   RDW 14.2 11.5 - 15.5 %   Platelets 247 150 - 400 K/uL   Neutrophils Relative % 57 %   Neutro Abs 4.4 1.7 - 7.7 K/uL   Lymphocytes Relative 33 %   Lymphs Abs 2.6 0.7 - 4.0 K/uL   Monocytes Relative 8 %   Monocytes Absolute 0.6 0.1 - 1.0 K/uL   Eosinophils Relative 2 %   Eosinophils Absolute 0.2 0.0 - 0.7 K/uL   Basophils Relative 0 %   Basophils Absolute 0.0 0.0 - 0.1 K/uL  Hepatic function   panel     Status: Abnormal   Collection Time: 04/28/15 11:29 AM  Result Value Ref Range   Total Protein 6.3 (L) 6.5 - 8.1 g/dL   Albumin 3.3 (L) 3.5 - 5.0 g/dL   AST 24 15 - 41 U/L   ALT 15 14 - 54 U/L   Alkaline Phosphatase 71 38 - 126 U/L   Total Bilirubin 0.5 0.3 - 1.2 mg/dL   Bilirubin, Direct <0.1 (L) 0.1 - 0.5 mg/dL   Indirect Bilirubin NOT CALCULATED 0.3 - 0.9 mg/dL  Magnesium     Status: Abnormal   Collection Time: 04/28/15 11:29 AM  Result Value Ref Range   Magnesium 1.6 (L) 1.7 - 2.4 mg/dL    Radiological Exams on Admission: No results found.  Assessment/Plan Principal Problem:   Hypocalcemia Active Problems:   Sick sinus syndrome (HCC)   HTN (hypertension)   Cardiomyopathy (HCC)   H/O total thyroidectomy   Hypomagnesemia    Hypocalcemia -Likely has to do with her recent thyroidectomy with likely injury to her parathyroid glands. -Has been given 1 g of calcium in the ED, will give 2 mg of IV calcium gluconate, recheck calcium levels in a.m. -We'll need to be on chronic calcium supplementation.  Iatrogenic hypothyroidism -Status post total thyroidectomy, continue Synthroid  Cardiomyopathy -Likely related to prolonged hyperthyroidism. -Continue metoprolol.  Sick  sinus syndrome -Status post permanent pacemaker.  DVT prophylaxis -Lovenox  CODE STATUS -Full code   Time Spent on Admission: 75 minutes  HERNANDEZ ACOSTA,ESTELA [facility] Pager: [phone] 04/28/2015, 4:42 PM     

## 2015-04-29 LAB — COMPREHENSIVE METABOLIC PANEL
ALK PHOS: 61 U/L (ref 38–126)
ALT: 12 U/L — AB (ref 14–54)
AST: 19 U/L (ref 15–41)
Albumin: 2.9 g/dL — ABNORMAL LOW (ref 3.5–5.0)
Anion gap: 8 (ref 5–15)
BILIRUBIN TOTAL: 0.4 mg/dL (ref 0.3–1.2)
BUN: 11 mg/dL (ref 6–20)
CALCIUM: 6.8 mg/dL — AB (ref 8.9–10.3)
CO2: 28 mmol/L (ref 22–32)
CREATININE: 0.81 mg/dL (ref 0.44–1.00)
Chloride: 104 mmol/L (ref 101–111)
GFR calc non Af Amer: >60 mL/min (ref >60)
Glucose, Bld: 88 mg/dL (ref 65–99)
Potassium: 4.1 mmol/L (ref 3.5–5.1)
SODIUM: 140 mmol/L (ref 135–145)
Total Protein: 5.6 g/dL — ABNORMAL LOW (ref 6.5–8.1)

## 2015-04-29 LAB — MAGNESIUM: Magnesium: 1.9 mg/dL (ref 1.7–2.4)

## 2015-04-29 MED ORDER — CALCIUM CARBONATE 1250 (500 CA) MG PO TABS: 2.0000 | ORAL_TABLET | Freq: Two times a day (BID) | ORAL | Status: DC

## 2015-04-29 NOTE — Discharge Summary (Signed)
Physician Discharge Summary  Alison Wilkinson [id number] DOB: [birth year] DOA: 04/28/2015  PCP: Alison File, MD  Admit date: 04/28/2015 Discharge date: 04/29/2015  Time spent: 45 minutes  Recommendations for Outpatient Follow-up:  -Will be discharged home today. -Advised to follow up with endocrinologist as planned for in 3 weeks.   Discharge Diagnoses:  Principal Problem:   Hypocalcemia Active Problems:   Sick sinus syndrome (HCC)   HTN (hypertension)   Cardiomyopathy ([place])   H/O total thyroidectomy   Hypomagnesemia   Discharge Condition: Stable and improved  Filed Weights   04/28/15 1110 04/28/15 1557  Weight: 104.327 kg (230 lb) 104.191 kg (229 lb 11.2 oz)    History of present illness:  Patient is a 45 year old woman with history of hypertension, hyperthyroidism status post recent total thyroidectomy on 1/27 presents to the hospital today with the above complaints. She states that starting yesterday she started to experience some tingling over her hands and feet. Today they have progressed to severe muscle cramps over her calves. She called Dr. Redmond Wilkinson who performed her thyroidectomy and he recommended she use calcium supplementation, she felt that her symptoms are progressing today so she came into the hospital for evaluation. Her calcium level was found to be at 6.8 and thus was referred for admission.  Hospital Course:   Hypocalcemia -Likely due to injury to parathyroid gland status post recent thyroidectomy on 1/27. -Has been given a total of 3 g of IV calcium, total calcium level still remains low at 6.8, however her symptoms have resolved. -Advised chronic calcium supplementation, she will follow-up with her endocrinologist in 3 weeks.  Hypomagnesemia -Replaced.  Iatrogenic hypothyroidism -Status post total thyroidectomy, continue Synthroid  Sick sinus syndrome -Status post permanent pacemaker  Cardiomyopathy -Likely related to prolonged  hyperthyroidism, continue metoprolol.  Procedures:  None   Consultations:  None  Discharge Instructions  Discharge Instructions    Increase activity slowly    Complete by:  As directed             Medication List    TAKE these medications        ALPRAZolam 1 MG tablet  Commonly known as:  XANAX  Take 0.5 mg by mouth daily as needed for anxiety.     calcium carbonate 1250 (500 Ca) MG tablet  Commonly known as:  OS-CAL - dosed in mg of elemental calcium  Take 2 tablets (1,000 mg of elemental calcium total) by mouth 2 (two) times daily with a meal.     famotidine 20 MG tablet  Commonly known as:  PEPCID  Take 20 mg by mouth 2 (two) times daily.     HYDROcodone-acetaminophen 5-325 MG tablet  Commonly known as:  NORCO/VICODIN  Take 1-2 tablets by mouth every 4 (four) hours as needed for moderate pain.     levothyroxine 150 MCG tablet  Commonly known as:  SYNTHROID, LEVOTHROID  Take 1 tablet (150 mcg total) by mouth daily before breakfast.     metoprolol succinate 25 MG 24 hr tablet  Commonly known as:  TOPROL-XL  Take 1 tablet by mouth daily.     promethazine 25 MG tablet  Commonly known as:  PHENERGAN  Take 12.5 mg by mouth every 6 (six) hours as needed for nausea or vomiting.       Allergies  Allergen Reactions  . Carvedilol Palpitations and Shortness Of Breath    Per pt report       Follow-up Information    Follow up with  Alison File, MD. Schedule an appointment as soon as possible for a visit in 2 weeks.   Specialty:  Family Medicine   Contact information:   [place] [phone]        The results of significant diagnostics from this hospitalization (including imaging, microbiology, ancillary and laboratory) are listed below for reference.    Significant Diagnostic Studies: Ct Abdomen W Contrast  04/17/2015  CLINICAL DATA:  RIGHT upper quadrant pain for 5 months. Increasing severity over last several nights.  EXAM: CT ABDOMEN WITH CONTRAST TECHNIQUE: Multidetector CT imaging of the abdomen was performed using the standard protocol following bolus administration of intravenous contrast. CONTRAST:  149mL OMNIPAQUE IOHEXOL 300 MG/ML  SOLN COMPARISON:  Ultrasound 04/16/2015 FINDINGS: Lower chest: Lung bases are clear. Pacemaker leads in the RIGHT heart Hepatobiliary: No focal hepatic lesion. No biliary duct dilatation. Gallbladder is normal. Common bile duct is normal. Pancreas: Pancreas is normal. No ductal dilatation. No pancreatic inflammation. Spleen: Normal spleen Adrenals/urinary tract: Adrenal glands kidneys and proximal ureters are normal. Stomach/Bowel: Stomach, duodenum, limited view of the bowel is unremarkable. Appendix is partially imaged and appears normal. Vascular/Lymphatic: Abdominal aorta is normal caliber. There is no retroperitoneal or periportal lymphadenopathy. No pelvic lymphadenopathy. Other: No free fluid. Musculoskeletal: No aggressive osseous lesion. IMPRESSION: 1. No acute abdominal findings. 2. Normal gallbladder and pancreas. Electronically Signed   By: Suzy Bouchard M.D.   On: 04/17/2015 16:42    Microbiology: No results found for this or any previous visit (from the past 240 hour(s)).   Labs: Basic Metabolic Panel:  Recent Labs Lab 04/25/15 1319 04/25/15 2150 04/26/15 0430 04/28/15 1129 04/29/15 0510  NA  --   --   --  138 140  K  --   --   --  3.7 4.1  CL  --   --   --  99* 104  CO2  --   --   --  28 28  GLUCOSE  --   --   --  107* 88  BUN  --   --   --  11 11  CREATININE  --   --   --  0.90 0.81  CALCIUM 8.6* 8.1* 8.1* 6.8* 6.8*  MG  --   --   --  1.6* 1.9   Liver Function Tests:  Recent Labs Lab 04/28/15 1129 04/29/15 0510  AST 24 19  ALT 15 12*  ALKPHOS 71 61  BILITOT 0.5 0.4  PROT 6.3* 5.6*  ALBUMIN 3.3* 2.9*   No results for input(s): LIPASE, AMYLASE in the last 168 hours. No results for input(s): AMMONIA in the last 168 hours. CBC:  Recent  Labs Lab 04/28/15 1129  WBC 7.8  NEUTROABS 4.4  HGB 13.7  HCT 41.4  MCV 95.6  PLT 247   Cardiac Enzymes: No results for input(s): CKTOTAL, CKMB, CKMBINDEX, TROPONINI in the last 168 hours. BNP: BNP (last 3 results) No results for input(s): BNP in the last 8760 hours.  ProBNP (last 3 results) No results for input(s): PROBNP in the last 8760 hours.  CBG: No results for input(s): GLUCAP in the last 168 hours.     SignedLelon Frohlich  [facility] Pager: [phone] 04/29/2015, 10:58 AM

## 2015-05-02 ENCOUNTER — Ambulatory Visit (HOSPITAL_COMMUNITY)
Admission: RE | Admit: 2015-05-02 | Discharge: 2015-05-02 | Disposition: A | Discharge status: Home / Self Care | Payer: Medicaid Other | Source: Ambulatory Visit | Attending: Family Medicine | Admitting: Family Medicine

## 2015-05-02 ENCOUNTER — Telehealth: Payer: Self-pay | Admitting: Family Medicine

## 2015-05-02 ENCOUNTER — Encounter (HOSPITAL_COMMUNITY): Payer: Self-pay

## 2015-05-02 ENCOUNTER — Encounter (HOSPITAL_COMMUNITY)
Admission: RE | Admit: 2015-05-02 | Discharge: 2015-05-02 | Disposition: A | Discharge status: Home / Self Care | Payer: Medicaid Other | Source: Ambulatory Visit | Attending: Family Medicine | Admitting: Family Medicine

## 2015-05-02 DIAGNOSIS — R1011 Right upper quadrant pain: Secondary | ICD-10-CM | POA: Insufficient documentation

## 2015-05-02 MED ORDER — SODIUM CHLORIDE 0.9% FLUSH
INTRAVENOUS | Status: AC
Start: 2015-05-02 — End: 2015-05-02
  Filled 2015-05-02: qty 200

## 2015-05-02 MED ORDER — SINCALIDE 5 MCG IJ SOLR
INTRAMUSCULAR | Status: AC
  Administered 2015-05-02: 2.08 ug via INTRAVENOUS
  Filled 2015-05-02: qty 5

## 2015-05-02 MED ORDER — TECHNETIUM TC 99M MEBROFENIN IV KIT
5.0000 | PACK | Freq: Once | INTRAVENOUS | Status: AC | PRN
  Administered 2015-05-02: 4.9 via INTRAVENOUS

## 2015-05-02 MED ORDER — STERILE WATER FOR INJECTION IJ SOLN
INTRAMUSCULAR | Status: AC
  Administered 2015-05-02: 2.08 mL via INTRAVENOUS
  Filled 2015-05-02: qty 10

## 2015-05-12 ENCOUNTER — Ambulatory Visit (INDEPENDENT_AMBULATORY_CARE_PROVIDER_SITE_OTHER): Payer: Medicaid Other | Admitting: Family Medicine

## 2015-05-12 ENCOUNTER — Encounter: Payer: Self-pay | Admitting: Family Medicine

## 2015-05-12 DIAGNOSIS — I429 Cardiomyopathy, unspecified: Secondary | ICD-10-CM

## 2015-05-12 DIAGNOSIS — Z860100 Personal history of colon polyps, unspecified: Secondary | ICD-10-CM

## 2015-05-12 DIAGNOSIS — Z720 Tobacco use: Secondary | ICD-10-CM | POA: Diagnosis not present

## 2015-05-12 MED ORDER — NICOTINE 7 MG/24HR TD PT24: 7.0000 mg | MEDICATED_PATCH | Freq: Every day | TRANSDERMAL | Status: DC

## 2015-05-12 MED ORDER — NICOTINE 14 MG/24HR TD PT24: 14.0000 mg | MEDICATED_PATCH | Freq: Every day | TRANSDERMAL | Status: DC

## 2015-05-12 MED ORDER — NICOTINE 21 MG/24HR TD PT24: 21.0000 mg | MEDICATED_PATCH | Freq: Every day | TRANSDERMAL | Status: DC

## 2015-05-12 NOTE — Progress Notes (Signed)
   HPI  Patient presents today her efor hospital f/u  She was admitted to [facility] on 1/31 for hypocalcemia after a thyroidectomy. She had caclium replacement and has felt better since that time  She feels fine now. She has seen her ENT who is confident there are two parathryroids remaining and recommended weaning off. She has and feels fine  She c/o palps, Has continued metop Feels well without chest pain.   She has appropriate f/u with endo scheduled  She is being trated for H pyloi by GI, she ahad a C scope with 7 polyps, f/u in 6 months scheduled  Would like to try the patch to quit smoking  PMH: Smoking status noted ROS: Per HPI  Objective: BP 132/97 mmHg  Pulse 88  Temp(Src) 97.2 F (36.2 C) (Oral)  Ht _0  (1.651 m)  Wt 225 lb 6.4 oz (102.241 kg)  BMI 37.51 kg/m2  LMP 04/17/2015 Gen: NAD, alert, cooperative with exam HEENT: NCAT Neck: healing scar lower neck CV: RRR, good S1/S2, no murmur Resp: CTABL, no wheezes, non-labored Ext: No edema, warm Neuro: Alert and oriented, No gross deficits  Assessment and plan:  # Palpitations, cardiomyopathy Her CM is presumed to be due to hyperthyroidism, continue metop No red flags  Recommend f/u with cards  # Tobacco abuse Trial of nicotine patch, discusssed quitting strategy  # Hypocalcemia After thyroidectomy, doing well w/o supp Check with PTH  # epigastric pain Continued, being treated for H pylori Unlikley GB, HIDA neg and Abd [place] neg F/u in 3 months  Colon polyps Has C scope in 6 months scheduled, 7 found, largest 2 cm, path unknown  HCM Pt planning to see GYN    Orders Placed This Encounter  Procedures  . CMP14+EGFR  . Calcium, ionized  . Parathyroid hormone, intact (no Ca)    Meds ordered this encounter  Medications  . omeprazole (PRILOSEC) 10 MG capsule    Sig: Take 40 mg by mouth daily.  [name] amoxicillin (AMOXIL) 500 MG capsule    Sig: Take 500 mg by mouth 2 (two) times daily.  .  clarithromycin (BIAXIN) 500 MG tablet    Sig: Take 500 mg by mouth 2 (two) times daily.  . nicotine (NICODERM CQ - DOSED IN MG/24 HOURS) 21 mg/24hr patch    Sig: Place 1 patch (21 mg total) onto the skin daily.    Dispense:  28 patch    Refill:  0    Month 1  . nicotine (NICODERM CQ - DOSED IN MG/24 HOURS) 14 mg/24hr patch    Sig: Place 1 patch (14 mg total) onto the skin daily.    Dispense:  28 patch    Refill:  0    Month 2  . nicotine (NICODERM CQ - DOSED IN MG/24 HR) 7 mg/24hr patch    Sig: Place 1 patch (7 mg total) onto the skin daily.    Dispense:  28 patch    Refill:  0    Month Paragould, MD [facility] 05/12/2015, 9:42 AM

## 2015-05-12 NOTE — Patient Instructions (Signed)
Great to see you!  Try to cut down by 1/2 each week after starting the patch Use 21 mg patch first for 1 month, the 14, then 7, then stop  Lets see you back in 3 months  Come back any time sooner if you need [place].

## 2015-05-15 ENCOUNTER — Telehealth: Payer: Self-pay | Admitting: Family Medicine

## 2015-05-15 LAB — CMP14+EGFR
A/G RATIO: 1.6 (ref 1.1–2.5)
ALT: 15 IU/L (ref 0–32)
AST: 17 IU/L (ref 0–40)
Albumin: 4.4 g/dL (ref 3.5–5.5)
Alkaline Phosphatase: 95 IU/L (ref 39–117)
BUN/Creatinine Ratio: 12 (ref 9–23)
BUN: 9 mg/dL (ref 6–24)
CALCIUM: 9.6 mg/dL (ref 8.7–10.2)
CO2: 22 mmol/L (ref 18–29)
Chloride: 101 mmol/L (ref 96–106)
Creatinine, Ser: 0.78 mg/dL (ref 0.57–1.00)
GFR calc Af Amer: 107 mL/min/{1.73_m2} (ref >59)
GFR calc non Af Amer: 93 mL/min/{1.73_m2} (ref >59)
GLUCOSE: 120 mg/dL — AB (ref 65–99)
Globulin, Total: 2.7 g/dL (ref 1.5–4.5)
POTASSIUM: 5.1 mmol/L (ref 3.5–5.2)
Sodium: 141 mmol/L (ref 134–144)
Total Protein: 7.1 g/dL (ref 6.0–8.5)

## 2015-05-15 LAB — PARATHYROID HORMONE, INTACT (NO CA): PTH: 78 pg/mL — ABNORMAL HIGH (ref 15–65)

## 2015-05-15 LAB — CALCIUM, IONIZED: Calcium, Ion: 5.1 mg/dL (ref 4.5–5.6)

## 2015-05-15 NOTE — Telephone Encounter (Signed)
Pt aware of labs  

## 2015-05-30 ENCOUNTER — Encounter: Payer: Self-pay | Admitting: Family Medicine

## 2015-05-30 ENCOUNTER — Ambulatory Visit (INDEPENDENT_AMBULATORY_CARE_PROVIDER_SITE_OTHER): Payer: Medicaid Other

## 2015-05-30 ENCOUNTER — Ambulatory Visit (INDEPENDENT_AMBULATORY_CARE_PROVIDER_SITE_OTHER): Payer: Medicaid Other | Admitting: Family Medicine

## 2015-05-30 ENCOUNTER — Encounter: Payer: Self-pay | Admitting: *Deleted

## 2015-05-30 VITALS — BP 121/79 | HR 75 | Temp 97.6°F | Ht 65.0 in | Wt 230.4 lb

## 2015-05-30 DIAGNOSIS — M25561 Pain in right knee: Secondary | ICD-10-CM | POA: Diagnosis not present

## 2015-05-30 DIAGNOSIS — M79652 Pain in left thigh: Secondary | ICD-10-CM

## 2015-05-30 MED ORDER — PREDNISONE 20 MG PO TABS: 40.0000 mg | ORAL_TABLET | Freq: Every day | ORAL | Status: DC

## 2015-05-30 NOTE — Patient Instructions (Signed)
Great to see you!  Try out the exercises. It sounds like patello-femoral syndrome of your right knee and neuropathic pain of your left thigh (possibly from your back but possibly not)

## 2015-05-30 NOTE — Progress Notes (Signed)
   HPI  Patient presents today here for left thigh pain and right knee pain.  Right knee pain Has been going on for several months, she's been overlooking this due to her recent surgery. She describes it as a toothache type pain that's worse at the end of the day. She has mild swelling on occasion, the pain is centered around the proximal part of her patella. No history of injury that she can room her. The locking or popping symptoms.  Left thigh pain She has a burning type pain on her left thigh with no rash. She has no skin changes or swelling. She's had similar pain on different occasions on her right flank, right abdomen, and left flank. These seem to come and go and last about 2 days at a time. This was concerning because it lasted for more than one week. She has mild lumbar bony midline pain.  PMH: Smoking status noted ROS: Per HPI  Objective: BP 121/79 mmHg  Pulse 75  Temp(Src) 97.6 F (36.4 C) (Oral)  Ht 5\' 5"  (1.651 m)  Wt 230 lb 6.4 oz (104.509 kg)  BMI 38.34 kg/m2 Gen: NAD, alert, cooperative with exam HEENT: NCAT CV: RRR, good S1/S2, no murmur Resp: CTABL, no wheezes, non-labored Ext: No edema, warm Neuro: Alert and oriented, sensation intact and strength 5/5 and in bilateral lower extremities   MSK: R knee without erythema, effusion, bruising, or gross deformity No joint line tenderness.  ligamentously intact to Lachman's and with varus and valgus stress.  Negative McMurray's test  Lumbar spinal tenderness to palpation- mild, no paraspinal muscle tenderness  DG right-sided knee Preserved joint space, no signs of OA  Assessment and plan:  # Right knee pain Consistent with patellofemoral syndrome Exercises reviewed - from sports med patient advisor X-ray today to evaluate for osteoarthritis- normal   # Left thigh pain Sounds neuropathic in nature, difficult to explain completely but possibly due to sciatic nerve irritation Steroid course Return  to clinic with any worsening or failure to improve     Orders Placed This Encounter  Procedures  . DG Knee 1-2 Views Right    Standing Status: Future     Number of Occurrences:      Standing Expiration Date: 07/29/2016    Order Specific Question:  Reason for Exam (SYMPTOM  OR DIAGNOSIS REQUIRED)    Answer:  R knee pain- eval for OA    Order Specific Question:  Is the patient pregnant?    Answer:  No    Order Specific Question:  Preferred imaging location?    Answer:  Internal    Meds ordered this encounter  Medications  . predniSONE (DELTASONE) 20 MG tablet    Sig: Take 2 tablets (40 mg total) by mouth daily with breakfast.    Dispense:  10 tablet    Refill:  0    Laroy Apple, MD [facility] 05/30/2015, 8:22 AM

## 2015-06-23 NOTE — Telephone Encounter (Signed)
Done

## 2015-08-05 ENCOUNTER — Encounter: Payer: Self-pay | Admitting: Family Medicine

## 2015-08-05 ENCOUNTER — Ambulatory Visit (INDEPENDENT_AMBULATORY_CARE_PROVIDER_SITE_OTHER): Payer: Medicaid Other | Admitting: Family Medicine

## 2015-08-05 VITALS — BP 139/92 | HR 76 | Temp 96.8°F | Ht 65.0 in | Wt 233.0 lb

## 2015-08-05 DIAGNOSIS — J029 Acute pharyngitis, unspecified: Secondary | ICD-10-CM | POA: Diagnosis not present

## 2015-08-05 DIAGNOSIS — J209 Acute bronchitis, unspecified: Secondary | ICD-10-CM | POA: Diagnosis not present

## 2015-08-05 MED ORDER — ALBUTEROL SULFATE HFA 108 (90 BASE) MCG/ACT IN AERS: 2.0000 | INHALATION_SPRAY | Freq: Four times a day (QID) | RESPIRATORY_TRACT | Status: DC | PRN

## 2015-08-05 MED ORDER — AMOXICILLIN-POT CLAVULANATE 875-125 MG PO TABS: 1.0000 | ORAL_TABLET | Freq: Two times a day (BID) | ORAL | Status: DC

## 2015-08-05 NOTE — Patient Instructions (Addendum)
Great to see you!  I am treating you with augmentin, this is strong enough to treat pneumonia  Try zyrtec daily and nasocort (2 sprays once a day) for the season   For cough try mucinex DM 12 hour   Acute Bronchitis Bronchitis is when the airways that extend from the windpipe into the lungs get red, puffy, and painful (inflamed). Bronchitis often causes thick spit (mucus) to develop. This leads to a cough. A cough is the most common symptom of bronchitis. In acute bronchitis, the condition usually begins suddenly and goes away over time (usually in 2 weeks). Smoking, allergies, and asthma can make bronchitis worse. Repeated episodes of bronchitis may cause more lung problems. HOME CARE  Rest.  Drink enough fluids to keep your pee (urine) clear or pale yellow (unless you need to limit fluids as told by your doctor).  Only take over-the-counter or prescription medicines as told by your doctor.  Avoid smoking and secondhand smoke. These can make bronchitis worse. If you are a smoker, think about using nicotine gum or skin patches. Quitting smoking will help your lungs heal faster.  Reduce the chance of getting bronchitis again by:  Washing your hands often.  Avoiding people with cold symptoms.  Trying not to touch your hands to your mouth, nose, or eyes.  Follow up with your doctor as told. GET HELP IF: Your symptoms do not improve after 1 week of treatment. Symptoms include:  Cough.  Fever.  Coughing up thick spit.  Body aches.  Chest congestion.  Chills.  Shortness of breath.  Sore throat. GET HELP RIGHT AWAY IF:   You have an increased fever.  You have chills.  You have severe shortness of breath.  You have bloody thick spit (sputum).  You throw up (vomit) often.  You lose too much body fluid (dehydration).  You have a severe headache.  You faint. MAKE SURE YOU:   Understand these instructions.  Will watch your condition.  Will get help right  away if you are not doing well or get worse.   This information is not intended to replace advice given to you by your health care provider. Make sure you discuss any questions you have with your health care provider.   Document Released: 09/01/2007 Document Revised: 11/15/2012 Document Reviewed: 09/05/2012 Elsevier Interactive Patient Education [employer].

## 2015-08-05 NOTE — Progress Notes (Signed)
   HPI  Patient presents today here with 29s of acute symptoms.  Patient's lines her last 4 days she's had nasal congestion, tightness and shortness of breath, headache, and cough productive of sick green sputum. She is a smoker, she is down to almost one cigarette a day.  She's tolerating food and fluids normally.  She has malaise but no fever, chills, sweats.  She has a history of recurrent strep pharyngitis with very large tonsils and tonsillar stones  PMH: Smoking status noted ROS: Per HPI  Objective: BP 139/92 mmHg  Pulse 76  Temp(Src) 96.8 F (36 C) (Oral)  Ht 5\' 5"  (1.651 m)  Wt 233 lb (105.688 kg)  BMI 38.77 kg/m2 Gen: NAD, alert, cooperative with exam HEENT: NCAT, TMs with swollen turbinates bilaterally, oropharynx with large erythematous tonsils Neck: Tender lymphadenopathy in his left anterior cervical area CV: RRR, good S1/S2, no murmur Resp: CTABL, no wheezes, non-labored- some slightly decreased breathing sounds Ext: No edema, warm Neuro: Alert and oriented, No gross deficits  Assessment and plan:  # acute bronchitis/acute pharyngitis Treat aggressively given very large tonsils, smoking, and recent surgery Augmentin Allergy regimen-Zyrtec and Nasacort Albuterol with a smoker and tight sounding lungs decreased breath sounds Supportive care discussed-Mucinex DM 12 hour Return to clinic with any concerns, failure to improve, or worsening.   Meds ordered this encounter  Medications  . amoxicillin-clavulanate (AUGMENTIN) 875-125 MG tablet    Sig: Take 1 tablet by mouth 2 (two) times daily.    Dispense:  20 tablet    Refill:  0  . albuterol (PROVENTIL HFA;VENTOLIN HFA) 108 (90 Base) MCG/ACT inhaler    Sig: Inhale 2 puffs into the lungs every 6 (six) hours as needed for wheezing or shortness of breath.    Dispense:  1 Inhaler    Refill:  0    Laroy Apple, MD [facility] 08/05/2015, 1:10 PM

## 2015-08-15 ENCOUNTER — Encounter: Payer: Self-pay | Admitting: Family

## 2015-08-15 ENCOUNTER — Ambulatory Visit (INDEPENDENT_AMBULATORY_CARE_PROVIDER_SITE_OTHER): Payer: Medicaid Other | Admitting: Family

## 2015-08-15 VITALS — BP 134/85 | HR 86 | Temp 97.5°F | Ht 65.0 in | Wt 235.0 lb

## 2015-08-15 DIAGNOSIS — Z720 Tobacco use: Secondary | ICD-10-CM

## 2015-08-15 DIAGNOSIS — N946 Dysmenorrhea, unspecified: Secondary | ICD-10-CM

## 2015-08-15 DIAGNOSIS — N921 Excessive and frequent menstruation with irregular cycle: Secondary | ICD-10-CM | POA: Diagnosis not present

## 2015-08-15 DIAGNOSIS — N926 Irregular menstruation, unspecified: Secondary | ICD-10-CM

## 2015-08-15 LAB — FINGERSTICK HEMOGLOBIN: HEMOGLOBIN: 15.7 g/dL (ref 11.1–15.9)

## 2015-08-15 MED ORDER — TRAMADOL HCL 50 MG PO TABS: 50.0000 mg | ORAL_TABLET | Freq: Three times a day (TID) | ORAL | Status: DC | PRN

## 2015-08-15 MED ORDER — NAPROXEN 500 MG PO TABS: 500.0000 mg | ORAL_TABLET | Freq: Two times a day (BID) | ORAL | Status: DC

## 2015-08-15 NOTE — Progress Notes (Signed)
   Subjective:    Patient ID: Alison Wilkinson, female    DOB: 1971-01-21, 45 y.o.   MRN: [id number]  HPI Pt presents with irregular menses. Pt is 45 years old smoker who states she has had a period for one month and three weeks. Pt states is stopped for three days and has started back with heavy bleeding. PT states she is having to changes super tampon every 15-30 mins. Pt states about 3 years she had similar problems and had a "benign growth" that was removed and had a D&C. Pt states she is having constant cramping pain 8 out 10 and having several blood clots.    Review of Systems  All other systems reviewed and are negative.      Objective:   Physical Exam  Constitutional: She is oriented to person, place, and time. She appears well-developed and well-nourished. No distress.  HENT:  Head: Normocephalic and atraumatic.  Cardiovascular: Normal rate, regular rhythm, normal heart sounds and intact distal pulses.   No murmur heard. Pulmonary/Chest: Effort normal and breath sounds normal. No respiratory distress. She has no wheezes.  Abdominal: Soft. Bowel sounds are normal. She exhibits no distension. There is tenderness (mild lower abd tenderness).  Musculoskeletal: Normal range of motion. She exhibits no edema or tenderness.  Neurological: She is alert and oriented to person, place, and time. She has normal reflexes. No cranial nerve deficit.  Skin: Skin is warm and dry.  Psychiatric: She has a normal mood and affect. Her behavior is normal. Judgment and thought content normal.  Vitals reviewed.    BP 134/85 mmHg  Pulse 86  Temp(Src) 97.5 F (36.4 C) (Oral)  Ht 5\' 5"  (1.651 m)  Wt 235 lb (106.595 kg)  BMI 39.11 kg/m2  LMP 08/15/2015      Assessment & Plan:  1. Irregular menstrual cycle - Ambulatory referral to Gynecology - Fingerstick Hemoglobin - traMADol (ULTRAM) 50 MG tablet; Take 1-2 tablets (50-100 mg total) by mouth every 8 (eight) hours as needed.  Dispense: 60  tablet; Refill: 0 - naproxen (NAPROSYN) 500 MG tablet; Take 1 tablet (500 mg total) by mouth 2 (two) times daily with a meal.  Dispense: 60 tablet; Refill: 1  2. Dysmenorrhea - Ambulatory referral to Gynecology - Fingerstick Hemoglobin - traMADol (ULTRAM) 50 MG tablet; Take 1-2 tablets (50-100 mg total) by mouth every 8 (eight) hours as needed.  Dispense: 60 tablet; Refill: 0 - naproxen (NAPROSYN) 500 MG tablet; Take 1 tablet (500 mg total) by mouth 2 (two) times daily with a meal.  Dispense: 60 tablet; Refill: 1  3. Menometrorrhagia - Ambulatory referral to Gynecology - Fingerstick Hemoglobin - traMADol (ULTRAM) 50 MG tablet; Take 1-2 tablets (50-100 mg total) by mouth every 8 (eight) hours as needed.  Dispense: 60 tablet; Refill: 0 - naproxen (NAPROSYN) 500 MG tablet; Take 1 tablet (500 mg total) by mouth 2 (two) times daily with a meal.  Dispense: 60 tablet; Refill: 1  4. Tobacco abuse - Ambulatory referral to Gynecology - Fingerstick Hemoglobin - traMADol (ULTRAM) 50 MG tablet; Take 1-2 tablets (50-100 mg total) by mouth every 8 (eight) hours as needed.  Dispense: 60 tablet; Refill: 0 - naproxen (NAPROSYN) 500 MG tablet; Take 1 tablet (500 mg total) by mouth 2 (two) times daily with a meal.  Dispense: 60 tablet; Refill: [place], FNP

## 2015-08-15 NOTE — Patient Instructions (Signed)

## 2015-08-18 ENCOUNTER — Telehealth: Payer: Self-pay | Admitting: Family

## 2015-08-20 ENCOUNTER — Encounter: Payer: Self-pay | Admitting: Obstetrics and Gynecology

## 2015-08-20 ENCOUNTER — Ambulatory Visit (INDEPENDENT_AMBULATORY_CARE_PROVIDER_SITE_OTHER): Payer: Medicaid Other | Admitting: Obstetrics and Gynecology

## 2015-08-20 VITALS — BP 130/84 | Ht 66.0 in | Wt 235.0 lb

## 2015-08-20 DIAGNOSIS — N946 Dysmenorrhea, unspecified: Secondary | ICD-10-CM

## 2015-08-20 DIAGNOSIS — N920 Excessive and frequent menstruation with regular cycle: Secondary | ICD-10-CM | POA: Diagnosis not present

## 2015-08-20 DIAGNOSIS — N939 Abnormal uterine and vaginal bleeding, unspecified: Secondary | ICD-10-CM

## 2015-08-20 MED ORDER — MEGESTROL ACETATE 40 MG PO TABS: 40.0000 mg | ORAL_TABLET | Freq: Three times a day (TID) | ORAL | Status: DC

## 2015-08-20 NOTE — Progress Notes (Signed)
Patient ID: Alison Wilkinson, female   DOB: 11-08-1970, 45 y.o.   MRN: [id number] Pt here today for vaginal bleeding and cramping. Pt states that she has been bleeding for about 2 months on and off. Pt states that she has had prolonged bleeding in the past but had a D&C and removed a mass from the top of her uterus.

## 2015-08-20 NOTE — Progress Notes (Signed)
Patient ID: Aleayah Tham, female   DOB: Aug 06, 1970, 45 y.o.   MRN: [id number]    [facility] Visit  @DATE @            Patient name: Marizol Ramirez MRN [id number]  Date of birth: [birth year]  CC & HPI:  Yuliet Poirot is a 45 y.o. female presenting today for ongoing periods of dysmenorrhea and menorrhagia, most recently for 2 months. Per pt, she has previously had D&C with hysteroscopy in 05/2013 by [facility] in [place] in effort to alleviate these symptoms. She states she had a growth removed from her uterus at this time. Pt states that since this procedure, her periods have not improved. Pt reports that her current period has lasted 2 months, with days of heavy bleeding followed by days of spotting in between. Pt notes that the bleeding is accompanied by vaginal pain as well as lower abdominal pain and cramping. Per pt, on heavy bleeding days, she changes a tampon or pad q30 minutes. Per pt, tampon insertion is painful. She is not sexually active. Pt reports no difficulty with bowel movements. She reports that she was advised to take an iron supplement d/t the heavy bleeding. Last pap was 2 years ago. Pt has one child who was vaginally delivered. Pt is a current daily smoker.   ROS:  Review of Systems  Gastrointestinal: Negative for constipation.  Genitourinary:       +dysmenorrhea and menorrhagia   All other systems reviewed and are negative.    Pertinent History Reviewed:   Reviewed Medical         Past Medical History  Diagnosis Date  . A-fib ([place])   . Syncope   . High cholesterol   . Headache   . SSS (sick sinus syndrome) ([place])   . Cardiomyopathy ([place])   . PONV (postoperative nausea and vomiting)   . Stroke [facility])     2009   afib  (none in years)  . Pacemaker     biotronic  PPM   DR. [place]   . Dysrhythmia   . Hypertension   . Shortness of breath dyspnea     WITH EXERTION   . Hyperthyroidism   . GERD (gastroesophageal reflux disease)                           Surgical Hx:    Past Surgical History  Procedure Laterality Date  . Pacemaker insertion    . Cardiac catheterization      8/16  . Tumor removal      UTERUS  . Thyroidectomy Bilateral 04/25/2015    Procedure: THYROIDECTOMY;  Surgeon: Melida Quitter, MD;  Location: [facility];  Service: ENT;  Laterality: Bilateral;  TOTAL THYROIDECTOMY   Medications: Reviewed & Updated - see associated section                       Current outpatient prescriptions:  .  albuterol (PROVENTIL HFA;VENTOLIN HFA) 108 (90 Base) MCG/ACT inhaler, Inhale 2 puffs into the lungs every 6 (six) hours as needed for wheezing or shortness of breath., Disp: 1 Inhaler, Rfl: 0 .  levothyroxine (SYNTHROID, LEVOTHROID) 150 MCG tablet, Take 1 tablet (150 mcg total) by mouth daily before breakfast. (Patient taking differently: Take 175 mcg by mouth daily before breakfast. ), Disp: 30 tablet, Rfl: 5 .  metoprolol succinate (TOPROL-XL) 25 MG 24 hr tablet, Take 1 tablet by  mouth 2 (two) times daily. 50 mg, Disp: , Rfl:  .  naproxen (NAPROSYN) 500 MG tablet, Take 1 tablet (500 mg total) by mouth 2 (two) times daily with a meal., Disp: 60 tablet, Rfl: 1 .  omeprazole (PRILOSEC) 10 MG capsule, Take 40 mg by mouth 2 (two) times daily. , Disp: , Rfl:  .  traMADol (ULTRAM) 50 MG tablet, Take 1-2 tablets (50-100 mg total) by mouth every 8 (eight) hours as needed., Disp: 60 tablet, Rfl: 0   Social History: Reviewed -  reports that she has been smoking Cigarettes.  She has a 35 pack-year smoking history. She has quit using smokeless tobacco.  Objective Findings:  Vitals: Blood pressure 130/84, height 5\' 6"  (1.676 m), weight 235 lb (106.595 kg), last menstrual period 06/15/2015.  Physical Examination: General appearance - alert, well appearing, and in no distress Abdomen - soft, nontender, nondistended, no masses or organomegaly Pelvic - normal external genitalia, vulva, vagina, cervix, uterus and adnexa. No tenderness. Mobile  uterus. Adequate support of the vaginal wall.  Musculoskeletal - no joint tenderness, deformity or swelling Extremities - peripheral pulses normal, no pedal edema, no clubbing or cyanosis Skin - normal coloration and turgor, no rashes, no suspicious skin lesions noted   Assessment & Plan:   A:  1. Ongoing dysmenorrhea and menorrhagia 2. DUB s/p D&C with hysteroscopy in 05/2013   P:  1. Will schedule pelvic US  2. Will rx tid Megace until bleeding is controlled  3. Request records from [facility] in [place]      By signing my name below, I, Hansel Feinstein, attest that this documentation has been prepared under the direction and in the presence of Jonnie Kind, MD. Electronically Signed: Hansel Feinstein, ED Scribe. 08/20/2015. 3:56 PM.  I personally performed the services described in this documentation, which was SCRIBED in my presence. The recorded information has been reviewed and considered accurate. It has been edited as necessary during review. Jonnie Kind, MD

## 2015-08-20 NOTE — Patient Instructions (Signed)

## 2015-08-27 ENCOUNTER — Other Ambulatory Visit: Payer: Self-pay | Admitting: Obstetrics and Gynecology

## 2015-08-27 DIAGNOSIS — N939 Abnormal uterine and vaginal bleeding, unspecified: Secondary | ICD-10-CM

## 2015-08-29 ENCOUNTER — Ambulatory Visit (INDEPENDENT_AMBULATORY_CARE_PROVIDER_SITE_OTHER): Payer: Medicaid Other

## 2015-08-29 DIAGNOSIS — N939 Abnormal uterine and vaginal bleeding, unspecified: Secondary | ICD-10-CM | POA: Diagnosis not present

## 2015-08-29 NOTE — Progress Notes (Signed)
Pelvic/TV US today for AUB.  Anteverted uterus measuring 7.6 x 4.7 x 6.4 cm. 2 fibroids noted. Bilateral ovaries appear normal and mobile. Endometrium appears symmetrical and measures 5.3 mm.  Fib #1: Right, 1.6 x 1.4 x 1.3 cm.  Fib #2: Posterior, 1.3 x 1.0 x 0.9 cm.

## 2016-01-08 ENCOUNTER — Ambulatory Visit (INDEPENDENT_AMBULATORY_CARE_PROVIDER_SITE_OTHER): Payer: Medicaid Other | Admitting: Family Medicine

## 2016-01-08 ENCOUNTER — Encounter: Payer: Self-pay | Admitting: Family Medicine

## 2016-01-08 VITALS — BP 138/84 | HR 69 | Temp 98.2°F | Ht 66.0 in | Wt 244.0 lb

## 2016-01-08 DIAGNOSIS — R590 Localized enlarged lymph nodes: Secondary | ICD-10-CM

## 2016-01-08 DIAGNOSIS — Z85850 Personal history of malignant neoplasm of thyroid: Secondary | ICD-10-CM

## 2016-01-08 DIAGNOSIS — R51 Headache: Secondary | ICD-10-CM

## 2016-01-08 DIAGNOSIS — R519 Headache, unspecified: Secondary | ICD-10-CM

## 2016-01-08 MED ORDER — PREDNISONE 20 MG PO TABS: 40.0000 mg | ORAL_TABLET | Freq: Every day | ORAL | 0 refills | Status: DC

## 2016-01-08 NOTE — Patient Instructions (Addendum)
Great to see you!  Lets have you call Dr. Gloriann Loan office to get seen for this lymph node  For the headaches: Take the predniosne, stop all tylenol and ibuprofen.

## 2016-01-08 NOTE — Progress Notes (Signed)
   HPI  Patient presents today ere with headaches and a swollen lymph node.  Lymph node A present for several years, over the last 3 weeks as rapidly changed and has gotten much more large and tender. She has no erythema fluctuance over the area and no signs of current respiratory infection. She denies nasal congestion, sore throat, or ear pain. 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   headaches Occipital headaches persistent over the last month, they are usually helped by Tylenol or ibuprofen. The last 2 hours to 12 hours. No photophobia, no nausea   PMH: Smoking status noted ROS: Per HPI  Objective: BP 138/84   Pulse 69   Temp 98.2 F (36.8 C) (Oral)   Ht 5\' 6"  (1.676 m)   Wt 244 lb (110.7 kg)   BMI 39.38 kg/m  Gen: NAD, alert, cooperative with exam HEENT: NCAT, nares and Tms WNL, oropharynxy clear Neck: R anterior cervical chain with approx 1.5-2 cm mildly tender firm LN that is freely mobile.  CV: RRR, good S1/S2, no murmur Resp: CTABL, no wheezes, non-labored Ext: No edema, warm Neuro: Alert and oriented, No gross deficits  Assessment and plan:  # LAD persistant right-sided cervical lymphadenopathy, has been present for years that suddenly getting worse. Patient has history of papillary thyroid tumor  Removed in surgery 8 months ago. She had clear margins at that time Although I think it is unlikely to be related to her thyroid tumor considering the fast change recently I think lymphoma or metastatic spread should be considered. Referring back to ENT  # headaches New problem Most likely medication overuse headaches Short course of steroids, stop all over-the-counter pain medications RTC if not improved as expected No signs of migraine HAs or red flags for intracranial pathology With recent cancer keep low threshold for Scan if not improving    Meds ordered this encounter  Medications  . levothyroxine  (SYNTHROID, LEVOTHROID) 125 MCG tablet    Sig: 1 tablet daily on empty stomach  . predniSONE (DELTASONE) 20 MG tablet    Sig: Take 2 tablets (40 mg total) by mouth daily with breakfast.    Dispense:  10 tablet    Refill:  0    Laroy Apple, MD [facility] 01/08/2016, 1:27 PM

## 2016-02-05 ENCOUNTER — Encounter: Payer: Self-pay | Admitting: Family Medicine

## 2016-02-05 ENCOUNTER — Ambulatory Visit (INDEPENDENT_AMBULATORY_CARE_PROVIDER_SITE_OTHER): Payer: Medicaid Other | Admitting: Family Medicine

## 2016-02-05 VITALS — BP 138/78 | HR 60 | Temp 97.5°F | Ht 66.0 in | Wt 245.0 lb

## 2016-02-05 DIAGNOSIS — R1031 Right lower quadrant pain: Secondary | ICD-10-CM

## 2016-02-05 DIAGNOSIS — R911 Solitary pulmonary nodule: Secondary | ICD-10-CM

## 2016-02-05 NOTE — Progress Notes (Signed)
   HPI  Patient presents today here is right lower quadrant abdominal pain.  Patient complains of about one week sharp intermittent colicky type right lower quadrant pain radiating to her right upper quadrant. She has no aggravating or alleviating factors.  She was seen [facility] ED 2 days ago worked up with a CT scan blood work, urinalysis that was all inconsequential for exploration of pain.  She did have a 4 mm pulmonary nodule noted on CT scan. She has a heavy smoking history.  Patient is tolerating foods and fluids normally. She has normal bowel movements, she does have intermittent melena, she has an established relationship with GI and has had a history of H. pylori which has been treated. She does not know she's never had an ulcer.   PMH: Smoking status noted ROS: Per HPI  Objective: BP 138/78   Pulse 60   Temp 97.5 F (36.4 C) (Oral)   Ht 5' 6" (1.676 m)   Wt 245 lb (111.1 kg)   BMI 39.54 kg/m  Gen: NAD, alert, cooperative with exam HEENT: NCAT, oromucosa moist CV: RRR, good S1/S2, no murmur Resp: CTABL, no wheezes, non-labored Abd: Soft, positive bowel sounds, tenderness to palpation in right upper quadrant, right lower quadrant, periumbilical area, and less so in the left lower quadrant. No CVA tenderness Ext: No edema, warm Neuro: Alert and oriented, No gross deficits  Assessment and plan:  # Right lower quadrant abdominal pain Unclear etiology Thorough workup in the emergency room 2 days ago, no change since that time. She's tolerating foods and fluids normally. I have recommended follow-up with GI within 1 week Repeat labs, if CBC shows up trending WBC would consider reimaging if pain is not improved by that point. Consider gallbladder etiology, lipase included as well- consider referred pain  # Pulmonary nodule 4 mm pulmonary nodule in right lower lobe, patient with heavy smoking history Follow-up CT scan in 12 months.   Lab summary for [facility]. Creatinine 0.69 AST 12.1, ALT 9 Total bilirubin less than 0.2 WBC 8.6 Urinalysis negative for leukocyte esterase, nitrites, and blood  CT, normal appendix, corpus luteal cyst, possible constipation, free fluid in the abdomen likely physiologic, fat-containing umbilical hernia     Orders Placed This Encounter  Procedures  . CBC with Differential  . CMP14+EGFR  . Lipase    Laroy Apple, MD [facility] 02/05/2016, 10:27 AM

## 2016-02-05 NOTE — Patient Instructions (Signed)
Great to see you!  Please come back if anything changes. We are always happy to look again.   Consider Seeing GI in the next week or so.

## 2016-02-06 LAB — CMP14+EGFR
A/G RATIO: 1.6 (ref 1.2–2.2)
ALK PHOS: 57 IU/L (ref 39–117)
ALT: 10 IU/L (ref 0–32)
AST: 11 IU/L (ref 0–40)
Albumin: 4.2 g/dL (ref 3.5–5.5)
BILIRUBIN TOTAL: 0.2 mg/dL (ref 0.0–1.2)
BUN/Creatinine Ratio: 18 (ref 9–23)
BUN: 14 mg/dL (ref 6–24)
CALCIUM: 8.9 mg/dL (ref 8.7–10.2)
CHLORIDE: 102 mmol/L (ref 96–106)
CO2: 23 mmol/L (ref 18–29)
Creatinine, Ser: 0.79 mg/dL (ref 0.57–1.00)
GFR calc Af Amer: 105 mL/min/{1.73_m2} (ref >59)
GFR, EST NON AFRICAN AMERICAN: 91 mL/min/{1.73_m2} (ref >59)
GLOBULIN, TOTAL: 2.7 g/dL (ref 1.5–4.5)
Glucose: 106 mg/dL — ABNORMAL HIGH (ref 65–99)
POTASSIUM: 4.9 mmol/L (ref 3.5–5.2)
SODIUM: 140 mmol/L (ref 134–144)
Total Protein: 6.9 g/dL (ref 6.0–8.5)

## 2016-02-06 LAB — CBC WITH DIFFERENTIAL/PLATELET
BASOS: 0 %
Basophils Absolute: 0 10*3/uL (ref 0.0–0.2)
EOS (ABSOLUTE): 0.2 10*3/uL (ref 0.0–0.4)
Eos: 3 %
HEMATOCRIT: 41 % (ref 34.0–46.6)
Hemoglobin: 13.8 g/dL (ref 11.1–15.9)
IMMATURE GRANULOCYTES: 0 %
Immature Grans (Abs): 0 10*3/uL (ref 0.0–0.1)
LYMPHS ABS: 2.1 10*3/uL (ref 0.7–3.1)
Lymphs: 33 %
MCH: 30.1 pg (ref 26.6–33.0)
MCHC: 33.7 g/dL (ref 31.5–35.7)
MCV: 90 fL (ref 79–97)
MONOS ABS: 0.4 10*3/uL (ref 0.1–0.9)
Monocytes: 6 %
NEUTROS PCT: 58 %
Neutrophils Absolute: 3.7 10*3/uL (ref 1.4–7.0)
PLATELETS: 301 10*3/uL (ref 150–379)
RBC: 4.58 x10E6/uL (ref 3.77–5.28)
RDW: 14.3 % (ref 12.3–15.4)
WBC: 6.5 10*3/uL (ref 3.4–10.8)

## 2016-02-06 LAB — LIPASE: LIPASE: 41 U/L (ref 14–72)

## 2016-03-24 ENCOUNTER — Ambulatory Visit (INDEPENDENT_AMBULATORY_CARE_PROVIDER_SITE_OTHER): Payer: Medicaid Other | Admitting: Family

## 2016-03-24 ENCOUNTER — Encounter: Payer: Self-pay | Admitting: Family

## 2016-03-24 VITALS — BP 117/76 | HR 74 | Temp 98.4°F | Ht 66.0 in | Wt 244.0 lb

## 2016-03-24 DIAGNOSIS — M5441 Lumbago with sciatica, right side: Secondary | ICD-10-CM

## 2016-03-24 MED ORDER — KETOROLAC TROMETHAMINE 60 MG/2ML IM SOLN
60.0000 mg | Freq: Once | INTRAMUSCULAR | Status: AC
  Administered 2016-03-24: 60 mg via INTRAMUSCULAR

## 2016-03-24 MED ORDER — NAPROXEN 500 MG PO TABS: 500.0000 mg | ORAL_TABLET | Freq: Two times a day (BID) | ORAL | 1 refills | Status: DC

## 2016-03-24 MED ORDER — CYCLOBENZAPRINE HCL 10 MG PO TABS: 10.0000 mg | ORAL_TABLET | Freq: Three times a day (TID) | ORAL | 0 refills | Status: DC | PRN

## 2016-03-24 MED ORDER — METHYLPREDNISOLONE ACETATE 80 MG/ML IJ SUSP
80.0000 mg | Freq: Once | INTRAMUSCULAR | Status: AC
  Administered 2016-03-24: 80 mg via INTRAMUSCULAR

## 2016-03-24 NOTE — Progress Notes (Signed)
   Subjective:    Patient ID: Alison Wilkinson, female    DOB: [birth year], 44 y.o.   MRN: [id number]  Back Pain  This is a new problem. The current episode started 1 to 4 weeks ago. The problem occurs constantly. The problem has been gradually worsening since onset. The pain is present in the lumbar spine. The quality of the pain is described as aching. The pain radiates to the right thigh. The pain is at a severity of 10/10. The pain is moderate. The symptoms are aggravated by bending, sitting and standing. Associated symptoms include leg pain and paresis. Pertinent negatives include no bladder incontinence, bowel incontinence, dysuria, headaches, tingling or weakness. Risk factors include poor posture and obesity. She has tried bed rest and NSAIDs for the symptoms. The treatment provided mild relief.      Review of Systems  Gastrointestinal: Negative for bowel incontinence.  Genitourinary: Negative for bladder incontinence and dysuria.  Musculoskeletal: Positive for back pain.  Neurological: Negative for tingling, weakness and headaches.  All other systems reviewed and are negative.      Objective:   Physical Exam  Constitutional: She is oriented to person, place, and time. She appears well-developed and well-nourished. No distress.  Cardiovascular: Normal rate, regular rhythm, normal heart sounds and intact distal pulses.   No murmur heard. Pulmonary/Chest: Effort normal and breath sounds normal. No respiratory distress. She has no wheezes.  Abdominal: Soft. Bowel sounds are normal. She exhibits no distension. There is no tenderness.  Musculoskeletal: She exhibits no edema or tenderness.  Pain in lower lumbar with flexion and rotation  Neurological: She is alert and oriented to person, place, and time.  Skin: Skin is warm and dry.  Psychiatric: She has a normal mood and affect. Her behavior is normal. Judgment and thought content normal.  Vitals reviewed.    BP 117/76   Pulse 74    Temp 98.4 F (36.9 C) (Oral)   Ht 5\' 6"  (1.676 m)   Wt 244 lb (110.7 kg)   BMI 39.38 kg/m       Assessment & Plan:  1. Acute midline low back pain with right-sided sciatica -Rest -Ice and heat -No other NSAID's while taking naprosyn -Sedation precaution discussed with flexeril RTO prn if symptoms do not improve or worsen - methylPREDNISolone acetate (DEPO-MEDROL) injection 80 mg; Inject 1 mL (80 mg total) into the muscle once. - ketorolac (TORADOL) injection 60 mg; Inject 2 mLs (60 mg total) into the muscle once. - naproxen (NAPROSYN) 500 MG tablet; Take 1 tablet (500 mg total) by mouth 2 (two) times daily with a meal.  Dispense: 60 tablet; Refill: 1 - cyclobenzaprine (FLEXERIL) 10 MG tablet; Take 1 tablet (10 mg total) by mouth 3 (three) times daily as needed for muscle spasms.  Dispense: 30 tablet; Refill: 0  Evelina Dun, FNP

## 2016-03-24 NOTE — Patient Instructions (Signed)
Spondylolisthesis Rehab Ask your health care provider which exercises are safe for you. Do exercises exactly as told by your health care provider and adjust them as directed. It is normal to feel mild stretching, pulling, tightness, or discomfort as you do these exercises, but you should stop right away if you feel sudden pain or your pain gets worse. Do not begin these exercises until told by your health care provider. Stretching and range of motion exercises These exercises warm up your muscles and joints and improve the movement and flexibility of your hips and your back. These exercises may also help to relieve pain, numbness, and tingling. Exercise A: Single knee to chest 1. Lie on your back on a firm surface with both legs straight. 2. Bend one of your knees. Use your hands to move your knee up toward your chest until you feel a gentle stretch in your lower back and buttock.  Hold your leg in this position by holding onto the front of your knee.  Keep your other leg as straight as possible. 3. Hold for __________ seconds. 4. Slowly return to the starting position. 5. Repeat this exercise with your other leg. Repeat __________ times. Complete this exercise __________ times a day. Exercise B: Double knee to chest 1. Lie on your back on a firm surface with both legs straight. 2. Bend one of your knees and move it toward your chest until you feel a gentle stretch in your lower back and buttock. 3. Tense your abdominal muscles and repeat the previous step with your other leg. 4. Hold both of your legs in this position by holding onto the backs of your thighs or the fronts of your knees. 5. Hold for __________ seconds. 6. Tense your abdominal muscles and slowly move your legs back to the floor, one leg at a time. Repeat __________ times. Complete this exercise __________ times a day. Strengthening exercises These exercises build strength and endurance in your back. Endurance is the ability to  use your muscles for a long time, even after they get tired. Exercise C: Pelvic tilt 1. Lie on your back on a firm bed or the floor. Bend your knees and keep your feet flat. 2. Tense your abdominal muscles. Tip your pelvis up toward the ceiling and flatten your lower back into the floor.  To help with this exercise, you may place a small towel under your lower back and try to push your back into the towel. 3. Hold for __________ seconds. 4. Let your muscles relax completely before you repeat this exercise. Repeat __________ times. Complete this exercise __________ times a day. Exercise D: Abdominal crunch 1. Lie on your back on a firm surface. Bend your knees and keep your feet flat. Cross your arms over your chest. 2. Tuck your chin down toward your chest, without bending your neck. 3. Use your abdominal muscles to lift your upper body off of the ground, straight up into the air.  Try to lift yourself until your shoulder blades are off the ground. You may need to work up to this.  Keep your lower back on the ground while you crunch upward.  Do not hold your breath. 4. Slowly lower yourself down. Keep your abdominal muscles tense until you are back to the starting position. Repeat __________ times. Complete this exercise __________ times a day. Exercise E: Alternating arm and leg raises 1. Get on your hands and knees on a firm surface. If you are on a hard floor, you   may want to use padding to cushion your knees, such as an exercise mat. 2. Line up your arms and legs. Your hands should be below your shoulders, and your knees should be below your hips. 3. Lift your left leg behind you. At the same time, raise your right arm and straighten it in front of you.  Do not lift your leg higher than your hip.  Do not lift your arm higher than your shoulder.  Keep your abdominal and back muscles tight.  Keep your hips facing the ground.  Do not arch your back.  Keep your balance carefully,  and do not hold your breath. 4. Hold for __________ seconds. 5. Slowly return to the starting position and repeat with your right leg and your left arm. Repeat __________ times. Complete this exercise __________ times a day. Posture and body mechanics   Body mechanics refers to the movements and positions of your body while you do your daily activities. Posture is part of body mechanics. Good posture and healthy body mechanics can help to relieve stress in your body's tissues and joints. Good posture means that your spine is in its natural S-curve position (your spine is neutral), your shoulders are pulled back slightly, and your head is not tipped forward. The following are general guidelines for applying improved posture and body mechanics to your everyday activities. Standing   When standing, keep your spine neutral and your feet about hip-width apart. Keep a slight bend in your knees. Your ears, shoulders, and hips should line up.  When you do a task in which you stand in one place for a long time, place one foot up on a stable object that is 2-4 inches (5-10 cm) high, such as a footstool. This helps keep your spine neutral. Sitting  When sitting, keep your spine neutral and keep your feet flat on the floor. Use a footrest, if necessary, and keep your thighs parallel to the floor. Avoid rounding your shoulders, and avoid tilting your head forward.  When working at a desk or a computer, keep your desk at a height where your hands are slightly lower than your elbows. Slide your chair under your desk so you are close enough to maintain good posture.  When working at a computer, place your monitor at a height where you are looking straight ahead and you do not have to tilt your head forward or downward to look at the screen. Resting   When lying down and resting, avoid positions that are most painful for you.  If you have pain with activities such as sitting, bending, stooping, or squatting  (flexion-based activities), lie in a position in which your body does not bend very much. For example, avoid curling up on your side with your arms and knees near your chest (fetal position).  If you have pain with activities such as standing for a long time or reaching with your arms (extension-based activities), lie with your spine in a neutral position and bend your knees slightly. Try the following positions:  Lying on your side with a pillow between your knees.  Lying on your back with a pillow under your knees. Lifting   When lifting objects, keep your feet at least shoulder-width apart and tighten your abdominal muscles.  Bend your knees and hips and keep your spine neutral. It is important to lift using the strength of your legs, not your back. Do not lock your knees straight out.  Always ask for help to lift   heavy or awkward objects. This information is not intended to replace advice given to you by your health care provider. Make sure you discuss any questions you have with your health care provider. Document Released: 03/15/2005 Document Revised: 11/20/2015 Document Reviewed: 12/24/2014 Elsevier Interactive Patient Education  2017 Elsevier Inc.  

## 2016-04-06 ENCOUNTER — Encounter: Payer: Self-pay | Admitting: Family Medicine

## 2016-04-06 ENCOUNTER — Other Ambulatory Visit: Payer: Self-pay | Admitting: Family Medicine

## 2016-04-06 ENCOUNTER — Ambulatory Visit (INDEPENDENT_AMBULATORY_CARE_PROVIDER_SITE_OTHER): Payer: Medicaid Other

## 2016-04-06 ENCOUNTER — Ambulatory Visit (INDEPENDENT_AMBULATORY_CARE_PROVIDER_SITE_OTHER): Payer: Medicaid Other | Admitting: Family Medicine

## 2016-04-06 VITALS — BP 115/79 | HR 80 | Temp 97.4°F | Ht 66.0 in | Wt 245.4 lb

## 2016-04-06 DIAGNOSIS — M5441 Lumbago with sciatica, right side: Secondary | ICD-10-CM | POA: Diagnosis not present

## 2016-04-06 MED ORDER — HYDROCODONE-ACETAMINOPHEN 5-325 MG PO TABS: 1.0000 | ORAL_TABLET | Freq: Four times a day (QID) | ORAL | 0 refills | Status: DC | PRN

## 2016-04-06 MED ORDER — PREDNISONE 10 MG PO TABS: ORAL_TABLET | ORAL | 0 refills | Status: DC

## 2016-04-06 NOTE — Progress Notes (Signed)
   HPI  Patient presents today with persistent midline low back pain.  Depression was seen on 12/27, 2 weeks ago, for midline low back pain treated with Naprosyn, Flexeril, and I am Depo-Medrol. She has had no improvement. She continues to have severe pain that is worse with walking or standing. When walking or standing she has radiation to her anterior right thigh.  She has subjective right lower extremity weakness. She does not have a problem walking. She denies bowel or bladder dysfunction. She denies fever, chills, sweats.  Patient has a history of a's disease with thyroid cancer found after thyroidectomy. She was not treated with radioactive iodine. She has a pacemaker in place so she cannot have an MRI.  PMH: Smoking status noted ROS: Per HPI  Objective: BP 115/79   Pulse 80   Temp 97.4 F (36.3 C) (Oral)   Ht 5\' 6"  (1.676 m)   Wt 245 lb 6.4 oz (111.3 kg)   BMI 39.61 kg/m  Gen: NAD, alert, cooperative with exam HEENT: NCAT CV: RRR, good S1/S2, no murmur Resp: CTABL, no wheezes, non-labored Ext: No edema, warm Neuro: Alert and oriented, strength 5/5 and sensation intact in bilateral lower extremities, 2+ patellar tendon reflexes bilaterally MSK: Midline tenderness to palpation lumbar area, left-sided paraspinal muscle tenderness as well.  Assessment and plan:  # Acute midline low back pain Persistent after approximately one month. Patient now failing treatment after I am Depo-Medrol, Flexeril, and Naprosyn. Given a small amount of hydrocodone Also given a 12 day course of prednisone. Plain film today, very low threshold for follow-up CT lumbar spine given history of thyroid cancer which was not treated systemically    Alison Apple, MD [facility] 04/06/2016, 8:46 AM

## 2016-04-06 NOTE — Patient Instructions (Addendum)
Great to see you!  We will call with x ray results  Take all prednisone, use the hydrocodone as needed

## 2016-04-09 ENCOUNTER — Telehealth: Payer: Self-pay | Admitting: Family Medicine

## 2016-04-09 ENCOUNTER — Other Ambulatory Visit: Payer: Self-pay | Admitting: Family Medicine

## 2016-04-09 DIAGNOSIS — Z85850 Personal history of malignant neoplasm of thyroid: Secondary | ICD-10-CM

## 2016-04-09 DIAGNOSIS — M545 Low back pain, unspecified: Secondary | ICD-10-CM | POA: Insufficient documentation

## 2016-04-09 NOTE — Telephone Encounter (Signed)
Patient aware of results.

## 2016-04-13 DIAGNOSIS — G479 Sleep disorder, unspecified: Secondary | ICD-10-CM | POA: Diagnosis not present

## 2016-04-13 DIAGNOSIS — R911 Solitary pulmonary nodule: Secondary | ICD-10-CM | POA: Diagnosis not present

## 2016-04-13 DIAGNOSIS — R0602 Shortness of breath: Secondary | ICD-10-CM | POA: Diagnosis not present

## 2016-04-13 DIAGNOSIS — I429 Cardiomyopathy, unspecified: Secondary | ICD-10-CM | POA: Diagnosis not present

## 2016-04-13 DIAGNOSIS — J449 Chronic obstructive pulmonary disease, unspecified: Secondary | ICD-10-CM | POA: Diagnosis not present

## 2016-04-19 ENCOUNTER — Ambulatory Visit (INDEPENDENT_AMBULATORY_CARE_PROVIDER_SITE_OTHER): Payer: Medicaid Other | Admitting: Pediatrics

## 2016-04-19 VITALS — BP 128/80 | HR 89 | Temp 98.9°F | Ht 67.0 in | Wt 240.0 lb

## 2016-04-19 DIAGNOSIS — J111 Influenza due to unidentified influenza virus with other respiratory manifestations: Secondary | ICD-10-CM

## 2016-04-19 DIAGNOSIS — J42 Unspecified chronic bronchitis: Secondary | ICD-10-CM | POA: Diagnosis not present

## 2016-04-19 MED ORDER — ALBUTEROL SULFATE (2.5 MG/3ML) 0.083% IN NEBU: 2.5000 mg | INHALATION_SOLUTION | Freq: Four times a day (QID) | RESPIRATORY_TRACT | 1 refills | Status: AC | PRN

## 2016-04-19 NOTE — Progress Notes (Signed)
  Subjective:   Patient ID: Armanee Nuffer, female    DOB: 1970-08-26, 46 y.o.   MRN: [id number] CC: Dizziness and pain when breathing in  HPI: Nataleigh Wynes is a 46 y.o. female presenting for Dizziness and pain when breathing in  Sick for past 6 days Diagnosed with the flu 3 days, not on tamiflu Several times today has felt lightheaded, felt heart palpitations, feels better when she sits down, though did happen once when she was already sitting Sees spots when she stands up fast Appetite has been down past few days Not sleeping as grandson is also sick with the flu, has been up taking care of him Staying hydrated, not urinating much Not eating much because of the flu, had two chicken tenders today When she coughs has a midline chest pain, sometimes goes to her back Continues to cough, has h/o COPD doesnt have albuterol at home, has not needed it recently  Relevant past medical, surgical, family and social history reviewed. Allergies and medications reviewed and updated. History  Smoking Status  . Current Every Day Smoker  . Packs/day: 1.00  . Years: 35.00  . Types: Cigarettes  Smokeless Tobacco  . Former User   ROS: Per HPI   Objective:    BP 128/80   Pulse 89   Temp 98.9 F (37.2 C) (Oral)   Ht 5\' 7"  (1.702 m)   Wt 240 lb (108.9 kg)   BMI 37.59 kg/m   Wt Readings from Last 3 Encounters:  04/19/16 240 lb (108.9 kg)  04/06/16 245 lb 6.4 oz (111.3 kg)  03/24/16 244 lb (110.7 kg)   O2 sat 97-98%  Gen: NAD, alert, cooperative with exam, NCAT, coughing some EYES: EOMI, no conjunctival injection, or no icterus ENT:  TMs dull gray b/l, OP without erythema LYMPH: no cervical LAD CV: NRRR, normal S1/S2, no murmur, distal pulses 2+ b/l Resp: CTABL, no wheezes, normal WOB Abd: +BS, soft, NTND. no guarding or organomegaly Ext: No edema, warm, no tenderness over calves Neuro: Alert and oriented MSK: normal muscle bulk  Assessment & Plan:  Brisha was seen today for  dizziness and pain when breathing in, diagnosed with flu 3 days ago  Diagnoses and all orders for this visit:  Influenza Minimal food intake past 5 days O2 sat normal, not tachycardic No longer with fevers Normal lung exam Suspect symptoms related to overall decreased intake, ongoing flu symptoms Low suspicion for PE Cont symptomatic care, push fluids Discussed return precautions  Chronic bronchitis, unspecified chronic bronchitis type (HCC) Trial albuterol for coughing, shortness of breath No wheezing today On spiriva -     albuterol (PROVENTIL) (2.5 MG/3ML) 0.083% nebulizer solution; Take 3 mLs (2.5 mg total) by nebulization every 6 (six) hours as needed for wheezing or shortness of breath.   Follow up plan: prn Assunta Found, MD [place]

## 2016-05-24 ENCOUNTER — Telehealth: Payer: Self-pay | Admitting: Family Medicine

## 2016-05-24 NOTE — Telephone Encounter (Signed)
Scheduled

## 2016-05-27 DIAGNOSIS — Z950 Presence of cardiac pacemaker: Secondary | ICD-10-CM | POA: Insufficient documentation

## 2016-07-23 ENCOUNTER — Ambulatory Visit (INDEPENDENT_AMBULATORY_CARE_PROVIDER_SITE_OTHER): Payer: Medicaid Other | Admitting: Family

## 2016-07-23 ENCOUNTER — Encounter: Payer: Self-pay | Admitting: Family

## 2016-07-23 VITALS — BP 124/82 | HR 71 | Temp 97.3°F | Ht 67.0 in | Wt 241.8 lb

## 2016-07-23 DIAGNOSIS — J020 Streptococcal pharyngitis: Secondary | ICD-10-CM | POA: Diagnosis not present

## 2016-07-23 DIAGNOSIS — J029 Acute pharyngitis, unspecified: Secondary | ICD-10-CM | POA: Diagnosis not present

## 2016-07-23 LAB — RAPID STREP SCREEN (MED CTR MEBANE ONLY): Strep Gp A Ag, IA W/Reflex: NEGATIVE

## 2016-07-23 LAB — CULTURE, GROUP A STREP

## 2016-07-23 MED ORDER — AMOXICILLIN 875 MG PO TABS: 875.0000 mg | ORAL_TABLET | Freq: Two times a day (BID) | ORAL | 0 refills | Status: DC

## 2016-07-23 NOTE — Progress Notes (Signed)
   Subjective:    Patient ID: Alison Wilkinson, female    DOB: Dec 27, 1970, 46 y.o.   MRN: [id number]  Pt presents to the office today with right ear, right throat and jaw pain. PT states she went to the dentist in oral surgeon in February and needs four teeth removed but waiting on her cardiologists to approve it.  Sore Throat   This is a new problem. The problem has been gradually worsening. The pain is worse on the right side. The pain is at a severity of 7/10. The pain is moderate. Associated symptoms include coughing, ear pain, headaches, a hoarse voice and trouble swallowing. Pertinent negatives include no congestion.      Review of Systems  HENT: Positive for ear pain, hoarse voice and trouble swallowing. Negative for congestion.   Respiratory: Positive for cough.   Neurological: Positive for headaches.  All other systems reviewed and are negative.      Objective:   Physical Exam  Constitutional: She is oriented to person, place, and time. She appears well-developed and well-nourished. No distress.  HENT:  Head: Normocephalic and atraumatic.  Right Ear: External ear normal.  Nose: Mucosal edema and rhinorrhea present.  Mouth/Throat: Uvula swelling present. Posterior oropharyngeal edema and posterior oropharyngeal erythema present.  Eyes: Pupils are equal, round, and reactive to light.  Neck: Normal range of motion. Neck supple. No thyromegaly present.  Cardiovascular: Normal rate, regular rhythm, normal heart sounds and intact distal pulses.   No murmur heard. Pulmonary/Chest: Effort normal and breath sounds normal. No respiratory distress. She has no wheezes.  Abdominal: Soft. Bowel sounds are normal. She exhibits no distension. There is no tenderness.  Musculoskeletal: Normal range of motion. She exhibits no edema or tenderness.  Neurological: She is alert and oriented to person, place, and time. She has normal reflexes. No cranial nerve deficit.  Skin: Skin is warm and dry.    Psychiatric: She has a normal mood and affect. Her behavior is normal. Judgment and thought content normal.  Vitals reviewed.     BP 124/82   Pulse 71   Temp 97.3 F (36.3 C) (Oral)   Ht 5\' 7"  (1.702 m)   Wt 241 lb 12.8 oz (109.7 kg)   BMI 37.87 kg/m      Assessment & Plan:  1. Sore throat - Rapid strep screen (not at [facility])  2. Strep throat - Take meds as prescribed - Use a cool mist humidifier  -Use saline nose sprays frequently -Saline irrigations of the nose can be very helpful if done frequently.  * 4X daily for 1 week*  * Use of a nettie pot can be helpful with this. Follow directions with this* -Force fluids -For any cough or congestion  Use plain Mucinex- regular strength or max strength is fine   * Children- consult with Pharmacist for dosing -For fever or aces or pains- take tylenol or ibuprofen appropriate for age and weight.  * for fevers greater than 101 orally you may alternate ibuprofen and tylenol every  3 hours. -Throat lozenges if help -New toothbrush in 3 days - amoxicillin (AMOXIL) 875 MG tablet; Take 1 tablet (875 mg total) by mouth 2 (two) times daily.  Dispense: 20 tablet; Refill: 0    Evelina Dun, FNP

## 2016-07-23 NOTE — Patient Instructions (Signed)
Strep Throat Strep throat is a bacterial infection of the throat. Your health care provider may call the infection tonsillitis or pharyngitis, depending on whether there is swelling in the tonsils or at the back of the throat. Strep throat is most common during the cold months of the year in children who are 5-46 years of age, but it can happen during any season in people of any age. This infection is spread from person to person (contagious) through coughing, sneezing, or close contact. What are the causes? Strep throat is caused by the bacteria called Streptococcus pyogenes. What increases the risk? This condition is more likely to develop in:  People who spend time in crowded places where the infection can spread easily.  People who have close contact with someone who has strep throat.  What are the signs or symptoms? Symptoms of this condition include:  Fever or chills.  Redness, swelling, or pain in the tonsils or throat.  Pain or difficulty when swallowing.  White or yellow spots on the tonsils or throat.  Swollen, tender glands in the neck or under the jaw.  Red rash all over the body (rare).  How is this diagnosed? This condition is diagnosed by performing a rapid strep test or by taking a swab of your throat (throat culture test). Results from a rapid strep test are usually ready in a few minutes, but throat culture test results are available after one or two days. How is this treated? This condition is treated with antibiotic medicine. Follow these instructions at home: Medicines  Take over-the-counter and prescription medicines only as told by your health care provider.  Take your antibiotic as told by your health care provider. Do not stop taking the antibiotic even if you start to feel better.  Have family members who also have a sore throat or fever tested for strep throat. They may need antibiotics if they have the strep infection. Eating and drinking  Do not  share food, drinking cups, or personal items that could cause the infection to spread to other people.  If swallowing is difficult, try eating soft foods until your sore throat feels better.  Drink enough fluid to keep your urine clear or pale yellow. General instructions  Gargle with a salt-water mixture 3-4 times per day or as needed. To make a salt-water mixture, completely dissolve -1 tsp of salt in 1 cup of warm water.  Make sure that all household members wash their hands well.  Get plenty of rest.  Stay home from school or work until you have been taking antibiotics for 24 hours.  Keep all follow-up visits as told by your health care provider. This is important. Contact a health care provider if:  The glands in your neck continue to get bigger.  You develop a rash, cough, or earache.  You cough up a thick liquid that is green, yellow-brown, or bloody.  You have pain or discomfort that does not get better with medicine.  Your problems seem to be getting worse rather than better.  You have a fever. Get help right away if:  You have new symptoms, such as vomiting, severe headache, stiff or painful neck, chest pain, or shortness of breath.  You have severe throat pain, drooling, or changes in your voice.  You have swelling of the neck, or the skin on the neck becomes red and tender.  You have signs of dehydration, such as fatigue, dry mouth, and decreased urination.  You become increasingly sleepy, or   you cannot wake up completely.  Your joints become red or painful. This information is not intended to replace advice given to you by your health care provider. Make sure you discuss any questions you have with your health care provider. Document Released: 03/12/2000 Document Revised: 11/12/2015 Document Reviewed: 07/08/2014 Elsevier Interactive Patient Education  2017 Elsevier Inc.  

## 2016-08-06 DIAGNOSIS — Z1231 Encounter for screening mammogram for malignant neoplasm of breast: Secondary | ICD-10-CM | POA: Diagnosis not present

## 2016-08-10 ENCOUNTER — Encounter (HOSPITAL_COMMUNITY): Payer: Self-pay

## 2016-08-10 ENCOUNTER — Encounter (HOSPITAL_COMMUNITY)
Admission: RE | Admit: 2016-08-10 | Discharge: 2016-08-10 | Disposition: A | Discharge status: Home / Self Care | Payer: Medicaid Other | Source: Ambulatory Visit | Attending: Oral Surgery | Admitting: Oral Surgery

## 2016-08-10 DIAGNOSIS — R9431 Abnormal electrocardiogram [ECG] [EKG]: Secondary | ICD-10-CM | POA: Diagnosis not present

## 2016-08-10 DIAGNOSIS — K029 Dental caries, unspecified: Secondary | ICD-10-CM | POA: Diagnosis not present

## 2016-08-10 DIAGNOSIS — Z01810 Encounter for preprocedural cardiovascular examination: Secondary | ICD-10-CM | POA: Diagnosis not present

## 2016-08-10 DIAGNOSIS — Z01812 Encounter for preprocedural laboratory examination: Secondary | ICD-10-CM | POA: Insufficient documentation

## 2016-08-10 HISTORY — DX: Personal history of urinary calculi: Z87.442

## 2016-08-10 HISTORY — DX: Malignant (primary) neoplasm, unspecified: C80.1

## 2016-08-10 HISTORY — DX: Chronic obstructive pulmonary disease, unspecified: J44.9

## 2016-08-10 HISTORY — DX: Sleep apnea, unspecified: G47.30

## 2016-08-10 HISTORY — DX: Pneumonia, unspecified organism: J18.9

## 2016-08-10 LAB — BASIC METABOLIC PANEL
ANION GAP: 4 — AB (ref 5–15)
BUN: 14 mg/dL (ref 6–20)
CALCIUM: 8.9 mg/dL (ref 8.9–10.3)
CO2: 25 mmol/L (ref 22–32)
CREATININE: 0.76 mg/dL (ref 0.44–1.00)
Chloride: 108 mmol/L (ref 101–111)
GFR calc Af Amer: >60 mL/min (ref >60)
GFR calc non Af Amer: >60 mL/min (ref >60)
GLUCOSE: 89 mg/dL (ref 65–99)
Potassium: 4.9 mmol/L (ref 3.5–5.1)
Sodium: 137 mmol/L (ref 135–145)

## 2016-08-10 LAB — CBC
HCT: 40.5 % (ref 36.0–46.0)
HEMOGLOBIN: 13.3 g/dL (ref 12.0–15.0)
MCH: 30 pg (ref 26.0–34.0)
MCHC: 32.8 g/dL (ref 30.0–36.0)
MCV: 91.2 fL (ref 78.0–100.0)
Platelets: 305 10*3/uL (ref 150–400)
RBC: 4.44 MIL/uL (ref 3.87–5.11)
RDW: 14.3 % (ref 11.5–15.5)
WBC: 6 10*3/uL (ref 4.0–10.5)

## 2016-08-10 LAB — HCG, SERUM, QUALITATIVE: Preg, Serum: NEGATIVE

## 2016-08-10 NOTE — Progress Notes (Signed)
Anesthesia Chart Review: Patient is a 46 year old female scheduled for multiple teeth extractions on 08/13/16 by Dr. Hoyt Koch.   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 BMI is consistent with obesity.     - PCP is Dr. Kenn File with [facility] in [place]. - Endocrinologist is Dr. Charlett Nose ([facility] [place], see Care Everywhere), last visit 04/23/16. - Pulmonologist is Dr. Rico Sheehan, last visit 04/13/16 by Carlos Levering, PA-C.  - EP cardiologist is Dr. Gabriel Carina ([place] [place], see Care Everywhere). - Primary cardiologist is Dr. Fuller Song [facility], see Care Everywhere), last visit 05/28/16. He wrote: PLAN: 1. She is informed that she should be at low risk from oral surgery standpoint 2. No indication for SBE prophylaxis based on prior evaluation and echocardiograms 3. Will look for paperwork or contact office of Dr Hoyt Koch - oral surgery.  4. SHe will have updated echo in June. If no improvement of LVEF may need higher dose of BB or add ARB or both. SHe is informed.  5. She will return to see me in 6 months time routinely.    Meds include albuterol, levothyroxine, Toprol XL, omeprazole, Spiriva.   BP 119/75   Pulse 80   Temp 36.8 C   Resp 20   Ht 5\' 5"  (1.651 m)   Wt 238 lb 4.8 oz (108.1 kg)   LMP 07/14/2016   SpO2 100%   BMI 39.66 kg/m   EKG 08/10/16: Atrial paced rhythm.  Echo 01/23/16 ([place]): LV is normal in size. Normal LV wall thickness. Mild diffuse hypokinesis of the LV. LVEF 45-50%.  Grade I (mild) diastolic dysfunction; abnormal relaxation pattern. Pacemaker lead in the RV. RV is normal in structure and function. Trace TR. Pulmonary hypertension is not suggested by Doppler findings.   Cardiac cath 11/12/14 (done due to abnormal stress echo; [facility], see Care Everywhere): 1. Normal left ventricular chamber dimension. 2. Mildly reduced left ventricular ejection fraction 45% with mild global hypokinesia most notable in the anteroapical segment CONCLUSIONS:  1. Abnormal stress echocardiogram. 2. Angiographically normal for age epicardial coronary arteries. 3. 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 Uncertain if this is related to hypertension. 4. Hypertension RECOMMENDATIONS:  1. Initiate Toprol. Initiate ACE inhibitor therapy. 2. Aggressive risk factor modification  Stress echo 10/31/14: This was an abnormal stress echocardiogram. Normal resting wall motion with stress-induced wall abnormalities consistent with ischemia in the anterior wall. Anterior wall muscle does not thicken in stress imaging, which could be consistent with ischemia. This stress image however failed to show overall LV thickening due to respiratory interference. Clinical correlation recommended. There is mild 1+ MR and mild 1+ TR. LV is normal in size, wall thickness and wall motion with EF 50-55%. LA is mildly dilated. No ischemic ECG changes noted.  Thyroid U/S 05/07/16: IMPRESSION:  No discernible thyroid parenchyma. No abnormality within the thyroid bed.  Spirometry 04/13/16 ([facility]): FEV1 2.08  Comment: 74% liters   FVC 3.60  Comment: 98% liters   FEV1/FVC 58  Comment: 58% %  TLC 5.62  Comment: 104% liters   DLCO 20.0  Comment: 69% ml/mmHg sec  PEAK FLOW   Comment: 43% 20 - 800 L/MIN   Result Impression  Reduced FEV1.Evidence of mild airflow obstruction.Normal static lung volumewithout evidence of restriction. Mildly  reduced diffusion capacity   Preoperative lab noted.  If no acute changes then I anticipate that she can proceed as planned. PPM perioperative device form is pending from [facility]. (The [employer] main # is [phone]. The main rep for [facility] is Ronne Binning.)  George Hugh [facility] Short Stay Center/Anesthesiology Phone [phone] 08/10/2016 3:50 PM

## 2016-08-10 NOTE — Pre-Procedure Instructions (Signed)
Alison Wilkinson  08/10/2016      [facility] [address], [place] [address] [place] [address] Phone: [phone] Fax: [phone]    Your procedure is scheduled on Friday, May 18                Report to [facility] Admitting at 7:00 AM                   Your surgery or procedure is scheduled for 9:00 AM   Call this number if you have problems the morning of surgery: [phone]               For any other questions, please call [phone], Monday - Friday 8 AM - 4 PM.    Remember:  Do not eat food or drink liquids after midnight Thursday, May 17.  Take these medicines the morning of surgery with A SIP OF WATER: levothyroxine (SYNTHROID, LEVOTHROID), metoprolol succinate (TOPROL-XL),  omeprazole (PRILOSEC).  Use Spiriva Inhaler , May use Albuterol Inhaler and please bring it to the hospital with you.  May take Tylenol if needed. Special instructions:   [facility]- Preparing For Surgery  Before surgery, you can play an important role. Because skin is not sterile, your skin needs to be as free of germs as possible. You can reduce the number of germs on your skin by washing with CHG (chlorahexidine gluconate) Soap before surgery.  CHG is an antiseptic cleaner which kills germs and bonds with the skin to continue killing germs even after washing.  Please do not use if you have an allergy to CHG or antibacterial soaps. If your skin becomes reddened/irritated stop using the CHG.  Do not shave (including legs and underarms) for at least 48 hours prior to first CHG shower. It is OK to shave your face.  Please follow these instructions carefully.   1. Shower the NIGHT BEFORE SURGERY and the MORNING OF SURGERY with CHG.   2. If you chose to wash your hair, wash your hair first as usual with your normal shampoo.  3. After you shampoo, rinse your hair and body thoroughly to remove the shampoo.  4. Use CHG as you would any other liquid  soap. You can apply CHG directly to the skin and wash gently with a scrungie or a clean washcloth.   5. Apply the CHG Soap to your body ONLY FROM THE NECK DOWN.  Do not use on open wounds or open sores. Avoid contact with your eyes, ears, mouth and genitals (private parts). Wash genitals (private parts) with your normal soap.  6. Wash thoroughly, paying special attention to the area where your surgery will be performed.  7. Thoroughly rinse your body with warm water from the neck down.  8. DO NOT shower/wash with your normal soap after using and rinsing off the CHG Soap.  9. Pat yourself dry with a CLEAN TOWEL.   10. Wear CLEAN PAJAMAS   11. Place CLEAN SHEETS on your bed the night of your first shower and DO NOT SLEEP WITH PETS.  Day of Surgery: Do not apply any deodorants/lotions. Please wear clean clothes to the hospital/surgery center.    Do not wear jewelry, make-up or nail polish.  Do not wear lotions, powders, or perfumes, or deoderant.  Do not shave 48 hours prior to surgery.  Men may shave face and neck.  Do not bring valuables to the hospital.  [facility] is not responsible for any belongings or valuables.  Contacts, dentures or bridgework may not be worn into surgery.  Leave your suitcase in the car.  After surgery it may be brought to your room.  For patients admitted to the hospital, discharge time will be determined by your treatment team.  Patients discharged the day of surgery will not be allowed to drive home.   Name and phone number of your driver: -  Please read over the following fact sheets that you were given: Alison Wilkinson- Preparing For Surgery and Patient Instructions for Mupirocin Application, Coughing and Deep Breathing, Pain Booklet

## 2016-08-10 NOTE — Pre-Procedure Instructions (Addendum)
Alison Wilkinson  08/10/2016      [facility] [address] [place] [address] [place] [address] Phone: [phone] Fax: [phone]    Your procedure is scheduled on Friday, May 18                Report to [facility] Admitting at 7:00 AM                   Your surgery or procedure is scheduled for 9:00 AM   Call this number if you have problems the morning of surgery: [phone]               For any other questions, please call [phone], Monday - Friday 8 AM - 4 PM.    Remember:  Do not eat food or drink liquids after midnight Thursday, May 17.  Take these medicines the morning of surgery with A SIP OF WATER: levothyroxine (SYNTHROID, LEVOTHROID), metoprolol succinate (TOPROL-XL),  omeprazole (PRILOSEC).  Use Spiriva Inhaler , May use Albuterol Inhaler and please bring it to the hospital with you .  May take Tylenol if needed  STOP all herbel meds, nsaids (aleve,naproxen,advil,ibuprofen)   prior to surgery. Starting TODAY 5.15.18   Special instructions:   [place]- Preparing For Surgery  Before surgery, you can play an important role. Because skin is not sterile, your skin needs to be as free of germs as possible. You can reduce the number of germs on your skin by washing with CHG (chlorahexidine gluconate) Soap before surgery.  CHG is an antiseptic cleaner which kills germs and bonds with the skin to continue killing germs even after washing.  Please do not use if you have an allergy to CHG or antibacterial soaps. If your skin becomes reddened/irritated stop using the CHG.  Do not shave (including legs and underarms) for at least 48 hours prior to first CHG shower. It is OK to shave your face.  Please follow these instructions carefully.   1. Shower the NIGHT BEFORE SURGERY and the MORNING OF SURGERY with CHG.   2. If you chose to wash your hair, wash your hair first as usual with your normal shampoo.  3. After you  shampoo, rinse your hair and body thoroughly to remove the shampoo.  4. Use CHG as you would any other liquid soap. You can apply CHG directly to the skin and wash gently with a scrungie or a clean washcloth.   5. Apply the CHG Soap to your body ONLY FROM THE NECK DOWN.  Do not use on open wounds or open sores. Avoid contact with your eyes, ears, mouth and genitals (private parts). Wash genitals (private parts) with your normal soap.  6. Wash thoroughly, paying special attention to the area where your surgery will be performed.  7. Thoroughly rinse your body with warm water from the neck down.  8. DO NOT shower/wash with your normal soap after using and rinsing off the CHG Soap.  9. Pat yourself dry with a CLEAN TOWEL.   10. Wear CLEAN PAJAMAS   11. Place CLEAN SHEETS on your bed the night of your first shower and DO NOT SLEEP WITH PETS.  Day of Surgery: Do not apply any deodorants/lotions. Please wear clean clothes to the hospital/surgery center.    Do not wear jewelry, make-up or nail polish.  Do not wear lotions, powders, or perfumes, or deoderant.  Do not shave 48 hours  prior to surgery.  Men may shave face and neck.  Do not bring valuables to the hospital.  [facility] is not responsible for any belongings or valuables.  Contacts, dentures or bridgework may not be worn into surgery.  Leave your suitcase in the car.  After surgery it may be brought to your room.  For patients admitted to the hospital, discharge time will be determined by your treatment team.  Patients discharged the day of surgery will not be allowed to drive home.   Name and phone number of your driver: -  Please read over the  fact sheets that you were given:

## 2016-08-11 NOTE — H&P (Signed)
HISTORY AND PHYSICAL  Alison Wilkinson is a 46 y.o. female patient with CC: painful teeth  No diagnosis found.  Past Medical History:  Diagnosis Date  . A-fib ([place])   . Cancer ([place])    thyroid  . Cardiomyopathy ([place])   . COPD (chronic obstructive pulmonary disease) ([place])   . Dysrhythmia   . GERD (gastroesophageal reflux disease)   . Headache   . High cholesterol   . History of kidney stones   . Hypertension   . Hyperthyroidism   . 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 Pneumonia    hx  . PONV (postoperative nausea and vomiting)   . Shortness of breath dyspnea    WITH EXERTION   . Sleep apnea    mild case no cpap reccommended  . SSS (sick sinus syndrome) ([place])   . Stroke [facility])    2009   afib  (none in years)  . Syncope     No current facility-administered medications for this encounter.    Current Outpatient Prescriptions  Medication Sig Dispense Refill  . acetaminophen (TYLENOL) 500 MG tablet Take 1,500 mg by mouth 3 (three) times daily as needed for moderate pain.    [name] albuterol (PROVENTIL HFA;VENTOLIN HFA) 108 (90 Base) MCG/ACT inhaler Inhale 1-2 puffs into the lungs every 6 (six) hours as needed for wheezing or shortness of breath.    [name] albuterol (PROVENTIL) (2.5 MG/3ML) 0.083% nebulizer solution Take 3 mLs (2.5 mg total) by nebulization every 6 (six) hours as needed for wheezing or shortness of breath. 150 mL 1  . levothyroxine (SYNTHROID, LEVOTHROID) 125 MCG tablet Take 125 mcg by mouth once daily before breakfast    . metoprolol succinate (TOPROL-XL) 25 MG 24 hr tablet Take 25 mg by mouth daily.     [name] omeprazole (PRILOSEC) 40 MG capsule Take 40 mg by mouth 2 (two) times daily.    [name] tiotropium (SPIRIVA) 18 MCG inhalation capsule Place 18 mcg into inhaler and inhale daily.    [name] amoxicillin (AMOXIL) 875 MG tablet Take 1 tablet (875 mg total) by mouth 2 (two) times daily. (Patient not taking: Reported on 08/05/2016) 20 tablet 0   Allergies   Allergen Reactions  . Carvedilol Palpitations and Shortness Of Breath    Per pt report  . Lisinopril Shortness Of Breath   Active Problems:   * No active hospital problems. *  Vitals: Last menstrual period 07/14/2016. Lab results:No results found for this or any previous visit (from the past 4 hour(s)). Radiology Results: No results found. General appearance: alert, cooperative and moderately obese Head: Normocephalic, without obvious abnormality, atraumatic Eyes: negative Nose: Nares normal. Septum midline. Mucosa normal. No drainage or sinus tenderness. Throat: dental caries teeth # 12, 16, 17, 32. Pharynx clear. no trismus Neck: no adenopathy, supple, symmetrical, trachea midline and thyroid not enlarged, symmetric, no tenderness/mass/nodules Resp: clear to auscultation bilaterally Cardio: regular rate and rhythm, S1, S2 normal, no murmur, click, rub or gallop  Assessment: nonrestorable teeth secondary to dental caries  Plan: Dental extractions. GA. Day surgery   Frederika Hukill M 08/11/2016

## 2016-08-12 NOTE — Anesthesia Preprocedure Evaluation (Addendum)
Anesthesia Evaluation  Patient identified by MRN, date of birth, ID band Patient awake    Reviewed: Allergy & Precautions, H&P , NPO status , Patient's Chart, lab work & pertinent test results  Airway Mallampati: II  TM Distance: >3 FB Neck ROM: Full    Dental no notable dental hx. (+) Poor Dentition, Dental Advisory Given   Pulmonary sleep apnea , COPD,  COPD inhaler, Current Smoker,    Pulmonary exam normal breath sounds clear to auscultation       Cardiovascular Exercise Tolerance: Good hypertension, Pt. on medications and Pt. on home beta blockers + dysrhythmias + pacemaker  Rhythm:Regular Rate:Normal     Neuro/Psych  Headaches, Anxiety negative psych ROS   GI/Hepatic Neg liver ROS, GERD  Medicated and Controlled,  Endo/Other  Hyperthyroidism Morbid obesity  Renal/GU negative Renal ROS  negative genitourinary   Musculoskeletal   Abdominal   Peds  Hematology negative hematology ROS (+)   Anesthesia Other Findings   Reproductive/Obstetrics negative OB ROS                            Anesthesia Physical Anesthesia Plan  ASA: III  Anesthesia Plan: General   Post-op Pain Management:    Induction: Intravenous  Airway Management Planned: Nasal ETT and Video Laryngoscope Planned  Additional Equipment:   Intra-op Plan:   Post-operative Plan: Extubation in OR  Informed Consent: I have reviewed the patients History and Physical, chart, labs and discussed the procedure including the risks, benefits and alternatives for the proposed anesthesia with the patient or authorized representative who has indicated his/her understanding and acceptance.   Dental advisory given  Plan Discussed with: CRNA  Anesthesia Plan Comments:         Anesthesia Quick Evaluation

## 2016-08-13 ENCOUNTER — Encounter (HOSPITAL_COMMUNITY)
Admission: RE | Disposition: A | Discharge status: Home / Self Care | Payer: Self-pay | Source: Ambulatory Visit | Attending: Oral Surgery

## 2016-08-13 ENCOUNTER — Ambulatory Visit (HOSPITAL_COMMUNITY): Payer: Medicaid Other | Admitting: Anesthesiology

## 2016-08-13 ENCOUNTER — Encounter (HOSPITAL_COMMUNITY): Payer: Self-pay | Admitting: *Deleted

## 2016-08-13 ENCOUNTER — Ambulatory Visit (HOSPITAL_COMMUNITY)
Admission: RE | Admit: 2016-08-13 | Discharge: 2016-08-13 | Disposition: A | Discharge status: Home / Self Care | Payer: Medicaid Other | Source: Ambulatory Visit | Attending: Oral Surgery | Admitting: Oral Surgery

## 2016-08-13 ENCOUNTER — Ambulatory Visit (HOSPITAL_COMMUNITY): Payer: Medicaid Other | Admitting: Vascular Surgery

## 2016-08-13 DIAGNOSIS — K029 Dental caries, unspecified: Secondary | ICD-10-CM | POA: Diagnosis not present

## 2016-08-13 DIAGNOSIS — Z6839 Body mass index (BMI) 39.0-39.9, adult: Secondary | ICD-10-CM | POA: Insufficient documentation

## 2016-08-13 DIAGNOSIS — Z8673 Personal history of transient ischemic attack (TIA), and cerebral infarction without residual deficits: Secondary | ICD-10-CM | POA: Insufficient documentation

## 2016-08-13 DIAGNOSIS — J449 Chronic obstructive pulmonary disease, unspecified: Secondary | ICD-10-CM | POA: Diagnosis not present

## 2016-08-13 DIAGNOSIS — Z79899 Other long term (current) drug therapy: Secondary | ICD-10-CM | POA: Diagnosis not present

## 2016-08-13 DIAGNOSIS — K219 Gastro-esophageal reflux disease without esophagitis: Secondary | ICD-10-CM | POA: Diagnosis not present

## 2016-08-13 DIAGNOSIS — I10 Essential (primary) hypertension: Secondary | ICD-10-CM | POA: Diagnosis not present

## 2016-08-13 DIAGNOSIS — F17200 Nicotine dependence, unspecified, uncomplicated: Secondary | ICD-10-CM | POA: Diagnosis not present

## 2016-08-13 DIAGNOSIS — Z85850 Personal history of malignant neoplasm of thyroid: Secondary | ICD-10-CM | POA: Insufficient documentation

## 2016-08-13 DIAGNOSIS — I429 Cardiomyopathy, unspecified: Secondary | ICD-10-CM | POA: Diagnosis not present

## 2016-08-13 DIAGNOSIS — Z950 Presence of cardiac pacemaker: Secondary | ICD-10-CM | POA: Diagnosis not present

## 2016-08-13 HISTORY — PX: MULTIPLE EXTRACTIONS WITH ALVEOLOPLASTY: SHX5342

## 2016-08-13 SURGERY — MULTIPLE EXTRACTION WITH ALVEOLOPLASTY
Anesthesia: General | Site: Mouth | Laterality: Bilateral

## 2016-08-13 MED ORDER — ONDANSETRON HCL 4 MG/2ML IJ SOLN
INTRAMUSCULAR | Status: DC | PRN
  Administered 2016-08-13: 4 mg via INTRAVENOUS

## 2016-08-13 MED ORDER — LIDOCAINE-EPINEPHRINE 2 %-1:100000 IJ SOLN
INTRAMUSCULAR | Status: DC | PRN
Start: 2016-08-13 — End: 2016-08-13
  Administered 2016-08-13: 14 mL

## 2016-08-13 MED ORDER — HYDROMORPHONE HCL 1 MG/ML IJ SOLN: 0.2500 mg | INTRAMUSCULAR | Status: DC | PRN

## 2016-08-13 MED ORDER — ONDANSETRON HCL 4 MG/2ML IJ SOLN
INTRAMUSCULAR | Status: AC
  Filled 2016-08-13: qty 2

## 2016-08-13 MED ORDER — MIDAZOLAM HCL 2 MG/2ML IJ SOLN
INTRAMUSCULAR | Status: AC
  Filled 2016-08-13: qty 2

## 2016-08-13 MED ORDER — SUCCINYLCHOLINE CHLORIDE 200 MG/10ML IV SOSY
PREFILLED_SYRINGE | INTRAVENOUS | Status: AC
  Filled 2016-08-13: qty 10

## 2016-08-13 MED ORDER — FENTANYL CITRATE (PF) 250 MCG/5ML IJ SOLN
INTRAMUSCULAR | Status: DC | PRN
  Administered 2016-08-13: 100 ug via INTRAVENOUS

## 2016-08-13 MED ORDER — LACTATED RINGERS IV SOLN
INTRAVENOUS | Status: DC
  Administered 2016-08-13 (×2): via INTRAVENOUS

## 2016-08-13 MED ORDER — CEFAZOLIN SODIUM-DEXTROSE 2-4 GM/100ML-% IV SOLN
2.0000 g | INTRAVENOUS | Status: AC
  Administered 2016-08-13: 2 g via INTRAVENOUS
  Filled 2016-08-13: qty 100

## 2016-08-13 MED ORDER — PROMETHAZINE HCL 25 MG/ML IJ SOLN
INTRAMUSCULAR | Status: AC
  Filled 2016-08-13: qty 1

## 2016-08-13 MED ORDER — OXYMETAZOLINE HCL 0.05 % NA SOLN
NASAL | Status: DC | PRN
  Administered 2016-08-13: 3 via NASAL

## 2016-08-13 MED ORDER — PROMETHAZINE HCL 25 MG/ML IJ SOLN
6.2500 mg | Freq: Once | INTRAMUSCULAR | Status: AC
  Administered 2016-08-13: 6.25 mg via INTRAVENOUS

## 2016-08-13 MED ORDER — LIDOCAINE-EPINEPHRINE 2 %-1:100000 IJ SOLN
INTRAMUSCULAR | Status: AC
  Filled 2016-08-13: qty 1

## 2016-08-13 MED ORDER — OXYMETAZOLINE HCL 0.05 % NA SOLN
NASAL | Status: AC
  Filled 2016-08-13: qty 15

## 2016-08-13 MED ORDER — SUCCINYLCHOLINE CHLORIDE 20 MG/ML IJ SOLN
INTRAMUSCULAR | Status: DC | PRN
  Administered 2016-08-13: 100 mg via INTRAVENOUS

## 2016-08-13 MED ORDER — FENTANYL CITRATE (PF) 250 MCG/5ML IJ SOLN
INTRAMUSCULAR | Status: AC
  Filled 2016-08-13: qty 5

## 2016-08-13 MED ORDER — ALBUTEROL SULFATE HFA 108 (90 BASE) MCG/ACT IN AERS
INHALATION_SPRAY | RESPIRATORY_TRACT | Status: AC
  Filled 2016-08-13: qty 6.7

## 2016-08-13 MED ORDER — LIDOCAINE HCL (CARDIAC) 20 MG/ML IV SOLN
INTRAVENOUS | Status: DC | PRN
  Administered 2016-08-13: 40 mg via INTRATRACHEAL

## 2016-08-13 MED ORDER — ALBUTEROL SULFATE HFA 108 (90 BASE) MCG/ACT IN AERS
INHALATION_SPRAY | RESPIRATORY_TRACT | Status: DC | PRN
  Administered 2016-08-13: 2 via RESPIRATORY_TRACT

## 2016-08-13 MED ORDER — LIDOCAINE 2% (20 MG/ML) 5 ML SYRINGE
INTRAMUSCULAR | Status: AC
  Filled 2016-08-13: qty 5

## 2016-08-13 MED ORDER — MIDAZOLAM HCL 2 MG/2ML IJ SOLN
INTRAMUSCULAR | Status: DC | PRN
  Administered 2016-08-13 (×2): 1 mg via INTRAVENOUS

## 2016-08-13 MED ORDER — PROPOFOL 10 MG/ML IV BOLUS
INTRAVENOUS | Status: DC | PRN
  Administered 2016-08-13: 50 mg via INTRAVENOUS
  Administered 2016-08-13: 150 mg via INTRAVENOUS

## 2016-08-13 MED ORDER — GLYCOPYRROLATE 0.2 MG/ML IJ SOLN
INTRAMUSCULAR | Status: DC | PRN
  Administered 2016-08-13: 0.2 mg via INTRAVENOUS

## 2016-08-13 MED ORDER — HYDROCODONE-ACETAMINOPHEN 5-325 MG PO TABS: 1.0000 | ORAL_TABLET | Freq: Four times a day (QID) | ORAL | 0 refills | Status: DC | PRN

## 2016-08-13 MED ORDER — PROPOFOL 10 MG/ML IV BOLUS
INTRAVENOUS | Status: AC
  Filled 2016-08-13: qty 20

## 2016-08-13 SURGICAL SUPPLY — 28 items
BUR CROSS CUT FISSURE 1.6 (BURR) ×2 IMPLANT
BUR EGG ELITE 4.0 (BURR) ×2 IMPLANT
CANISTER SUCT 3000ML PPV (MISCELLANEOUS) ×2 IMPLANT
COVER SURGICAL LIGHT HANDLE (MISCELLANEOUS) ×2 IMPLANT
CRADLE DONUT ADULT HEAD (MISCELLANEOUS) ×2 IMPLANT
DECANTER SPIKE VIAL GLASS SM (MISCELLANEOUS) IMPLANT
DRAPE U-SHAPE 76X120 STRL (DRAPES) ×2 IMPLANT
FLUID NSS /IRRIG 1000 ML XXX (MISCELLANEOUS) ×2 IMPLANT
GAUZE PACKING FOLDED 2  STR (GAUZE/BANDAGES/DRESSINGS) ×1
GAUZE PACKING FOLDED 2 STR (GAUZE/BANDAGES/DRESSINGS) ×1 IMPLANT
GLOVE BIO SURGEON STRL SZ 6.5 (GLOVE) ×2 IMPLANT
GLOVE BIO SURGEON STRL SZ7.5 (GLOVE) ×2 IMPLANT
GLOVE BIOGEL PI IND STRL 7.0 (GLOVE) IMPLANT
GLOVE BIOGEL PI INDICATOR 7.0 (GLOVE)
GOWN STRL REUS W/ TWL LRG LVL3 (GOWN DISPOSABLE) ×1 IMPLANT
GOWN STRL REUS W/ TWL XL LVL3 (GOWN DISPOSABLE) ×1 IMPLANT
GOWN STRL REUS W/TWL LRG LVL3 (GOWN DISPOSABLE) ×1
GOWN STRL REUS W/TWL XL LVL3 (GOWN DISPOSABLE) ×1
KIT BASIN OR (CUSTOM PROCEDURE TRAY) ×2 IMPLANT
KIT ROOM TURNOVER OR (KITS) ×2 IMPLANT
NEEDLE 22X1 1/2 (OR ONLY) (NEEDLE) ×4 IMPLANT
NS IRRIG 1000ML POUR BTL (IV SOLUTION) ×2 IMPLANT
PAD ARMBOARD 7.5X6 YLW CONV (MISCELLANEOUS) ×2 IMPLANT
SUT CHROMIC 3 0 PS 2 (SUTURE) ×2 IMPLANT
SYR CONTROL 10ML LL (SYRINGE) ×2 IMPLANT
TRAY ENT MC OR (CUSTOM PROCEDURE TRAY) ×2 IMPLANT
TUBING IRRIGATION (MISCELLANEOUS) ×2 IMPLANT
YANKAUER SUCT BULB TIP NO VENT (SUCTIONS) ×2 IMPLANT

## 2016-08-13 NOTE — Transfer of Care (Signed)
Immediate Anesthesia Transfer of Care Note  Patient: Alison Wilkinson  Procedure(s) Performed: Procedure(s): MULTIPLE EXTRACTIONS (Bilateral)  Patient Location: PACU  Anesthesia Type:General  Level of Consciousness: awake, alert  and patient cooperative  Airway & Oxygen Therapy: Patient Spontanous Breathing and Patient connected to face mask oxygen  Post-op Assessment: Report given to RN, Post -op Vital signs reviewed and stable, Patient moving all extremities X 4 and Patient able to stick tongue midline  Post vital signs: Reviewed and stable  Last Vitals:  Vitals:   08/13/16 0614  BP: 125/81  Pulse: 70  Resp: 20  Temp: 36.8 C    Last Pain: There were no vitals filed for this visit.       Complications: No apparent anesthesia complications

## 2016-08-13 NOTE — Op Note (Signed)
NAME:  Alison Wilkinson, Alison Wilkinson NO.:  [id number]  MEDICAL RECORD NO.:  [id number]  LOCATION:                                 FACILITY:  PHYSICIAN:  Gae Bon, M.D.       DATE OF BIRTH:  DATE OF PROCEDURE:  08/13/2016 DATE OF DISCHARGE:                              OPERATIVE REPORT   PREOPERATIVE DIAGNOSIS:  Nonrestorable teeth secondary to dental caries numbers 12, 16, 17, 32.  POSTOPERATIVE DIAGNOSIS:  Nonrestorable teeth secondary to dental caries numbers 12, 16, 17, 32.  PROCEDURE:  Extraction of teeth numbers 12, 16, 17, 32.  SURGEON:  Gae Bon, M.D.  ANESTHESIA:  Retta Diones, attending.  Nasal intubation.  PROCEDURE DESCRIPTION:  The patient was taken to the operating room, placed on the table in supine position.  General anesthesia was administered intravenously and a nasal endotracheal tube was placed and secured, the eyes were protected, and the patient was draped for the procedure.  Time-out was performed.  The posterior pharynx was suctioned.  A throat pack was placed.  Lidocaine 2% with 1:100,000 epinephrine was infiltrated in an inferior alveolar block on the right and left side and a buccal and palatal infiltration around teeth #12 and #16.  A bite block was placed on the right side of the mouth and left side was operated first.  A #15 blade was used to make an incision around teeth numbers 12, 16, and 17.  The periosteum was reflected from around these teeth.  The teeth were elevated with a 301 elevator and removed from the mouth with a dental forceps.  The sockets were curetted and irrigated.  The suture was placed in the area of tooth #17.  Then, the bite-block was repositioned to the side of the mouth and a 15 blade was used to make an incision around tooth #32.  The periosteum was reflected with a periosteal elevator.  The tooth was elevated with a 301 elevator and removed from the mouth with a #151 dental forceps.   The socket was curetted, irrigated and closed with 3-0 chromic.  Then, the oral cavity was irrigated and suctioned.  Throat pack was removed.  The patient was awakened, taken to the recovery room, breathing spontaneously in good condition.  ESTIMATED BLOOD LOSS:  Minimal.  COMPLICATIONS:  None.  SPECIMENS:  None.     Gae Bon, M.D.   ______________________________ Gae Bon, M.D.    SMJ/MEDQ  D:  08/13/2016  T:  08/13/2016  Job:  [id number]

## 2016-08-13 NOTE — Anesthesia Procedure Notes (Signed)
Procedure Name: Intubation Date/Time: 08/13/2016 8:43 AM Performed by: Roderic Palau Pre-anesthesia Checklist: Patient identified, Emergency Drugs available, Suction available and Patient being monitored Patient Re-evaluated:Patient Re-evaluated prior to inductionOxygen Delivery Method: Circle system utilized Preoxygenation: Pre-oxygenation with 100% oxygen Intubation Type: IV induction Ventilation: Mask ventilation without difficulty and Nasal airway inserted- appropriate to patient size Laryngoscope Size: Glidescope (T3) Grade View: Grade I Tube type: Oral Nasal Tubes: Magill forceps- large, utilized and Right Tube size: 6.5 mm Number of attempts: 1 Airway Equipment and Method: Video-laryngoscopy Placement Confirmation: ETT inserted through vocal cords under direct vision,  positive ETCO2 and breath sounds checked- equal and bilateral Tube secured with: Tape Dental Injury: Teeth and Oropharynx as per pre-operative assessment and Bloody posterior oropharynx

## 2016-08-13 NOTE — Op Note (Signed)
08/13/2016  9:02 AM  PATIENT:  Alison Wilkinson  46 y.o. female  PRE-OPERATIVE DIAGNOSIS:  NON RESTORABLE TEETH # 12, 16, 17, 32  POST-OPERATIVE DIAGNOSIS:  SAME  PROCEDURE:  Procedure(s): MULTIPLE EXTRACTIONS TEETH # 12, 16, 17, 32  SURGEON:  Surgeon(s): Hoyt Koch, Event organiser, DDS  ANESTHESIA:   local and general  EBL:  minimal  DRAINS: none   SPECIMEN:  No Specimen  COUNTS:  YES  PLAN OF CARE: Discharge to home after PACU  PATIENT DISPOSITION:  PACU - hemodynamically stable.   PROCEDURE DETAILS: Dictation # [id number]  Gae Bon, DMD 08/13/2016 9:02 AM

## 2016-08-13 NOTE — H&P (Signed)
H&P documentation  -History and Physical Reviewed  -Patient has been re-examined  -No change in the plan of care  Kenyana Husak M  

## 2016-08-13 NOTE — Anesthesia Postprocedure Evaluation (Signed)
Anesthesia Post Note  Patient: Alison Wilkinson  Procedure(s) Performed: Procedure(s) (LRB): MULTIPLE EXTRACTIONS (Bilateral)  Patient location during evaluation: PACU Anesthesia Type: General Level of consciousness: awake and alert Pain management: pain level controlled Vital Signs Assessment: post-procedure vital signs reviewed and stable Respiratory status: spontaneous breathing, nonlabored ventilation and respiratory function stable Cardiovascular status: blood pressure returned to baseline and stable Postop Assessment: no signs of nausea or vomiting Anesthetic complications: no       Last Vitals:  Vitals:   08/13/16 1020 08/13/16 1032  BP: 130/85 136/90  Pulse: 76 70  Resp: 15   Temp:      Last Pain:  Vitals:   08/13/16 1032  PainSc: 0-No pain                 Vanya Carberry,W. EDMOND

## 2016-08-14 ENCOUNTER — Encounter (HOSPITAL_COMMUNITY): Payer: Self-pay | Admitting: Oral Surgery

## 2016-08-19 ENCOUNTER — Encounter: Payer: Self-pay | Admitting: Family Medicine

## 2016-08-19 ENCOUNTER — Ambulatory Visit (INDEPENDENT_AMBULATORY_CARE_PROVIDER_SITE_OTHER): Payer: Medicaid Other | Admitting: Family Medicine

## 2016-08-19 ENCOUNTER — Ambulatory Visit: Payer: Medicaid Other | Admitting: Family Medicine

## 2016-08-19 VITALS — BP 133/88 | HR 72 | Temp 98.6°F | Ht 65.0 in | Wt 235.0 lb

## 2016-08-19 DIAGNOSIS — J351 Hypertrophy of tonsils: Secondary | ICD-10-CM | POA: Diagnosis not present

## 2016-08-19 DIAGNOSIS — J312 Chronic pharyngitis: Secondary | ICD-10-CM

## 2016-08-19 DIAGNOSIS — J358 Other chronic diseases of tonsils and adenoids: Secondary | ICD-10-CM

## 2016-08-19 MED ORDER — PREDNISONE 20 MG PO TABS: ORAL_TABLET | ORAL | 0 refills | Status: DC

## 2016-08-19 NOTE — Progress Notes (Signed)
BP 133/88   Pulse 72   Temp 98.6 F (37 C) (Oral)   Ht 5\' 5"  (1.651 m)   Wt 235 lb (106.6 kg)   BMI 39.11 kg/m    Subjective:    Patient ID: Alison Wilkinson, female    DOB: [birth year], 46 y.o.   MRN: [id number]  HPI: Alison Wilkinson is a 46 y.o. female presenting on 08/19/2016 for Sore Throat (was seen and treated for strep throat on 07/23/16, sore throat has never completely gone away)   HPI Recurrent pharyngitis. Patient has been having sore throat recurrently for quite some time over the past few years. She feels like she gets sore throat at least 6-10 times a year. She came and most recently on 07/23/2016 and was treated for strep pharyngitis with amoxicillin, rapid strep negative. She has been treated with multiple rounds of antibiotics in the past. She denies any fevers or chills or shortness of breath or wheezing. She does have a cough but says it's her chronic cough that she has with COPD and nothing more than that.  Relevant past medical, surgical, family and social history reviewed and updated as indicated. Interim medical history since our last visit reviewed. Allergies and medications reviewed and updated.  Review of Systems  Constitutional: Negative for chills and fever.  HENT: Positive for sneezing and sore throat. Negative for congestion, ear discharge, ear pain, postnasal drip, rhinorrhea and sinus pressure.   Respiratory: Positive for cough. Negative for chest tightness and shortness of breath.   Cardiovascular: Negative for chest pain and leg swelling.  Musculoskeletal: Negative for back pain and gait problem.  Skin: Negative for rash.  Neurological: Negative for light-headedness and headaches.  Psychiatric/Behavioral: Negative for agitation and behavioral problems.  All other systems reviewed and are negative.   Per HPI unless specifically indicated above        Objective:    BP 133/88   Pulse 72   Temp 98.6 F (37 C) (Oral)   Ht 5\' 5"  (1.651 m)    Wt 235 lb (106.6 kg)   BMI 39.11 kg/m   Wt Readings from Last 3 Encounters:  08/19/16 235 lb (106.6 kg)  08/13/16 236 lb (107 kg)  08/10/16 238 lb 4.8 oz (108.1 kg)    Physical Exam  Constitutional: She is oriented to person, place, and time. She appears well-developed and well-nourished. No distress.  HENT:  Right Ear: Tympanic membrane, external ear and ear canal normal.  Left Ear: Tympanic membrane, external ear and ear canal normal.  Nose: No mucosal edema or rhinorrhea. No epistaxis. Right sinus exhibits no maxillary sinus tenderness and no frontal sinus tenderness. Left sinus exhibits no maxillary sinus tenderness and no frontal sinus tenderness.  Mouth/Throat: Uvula is midline and mucous membranes are normal. Posterior oropharyngeal edema (Grade 3 tonsils with crypts and tonsilliths) present. No oropharyngeal exudate, posterior oropharyngeal erythema or tonsillar abscesses.  Eyes: Conjunctivae and EOM are normal.  Cardiovascular: Normal rate, regular rhythm, normal heart sounds and intact distal pulses.   No murmur heard. Pulmonary/Chest: Effort normal and breath sounds normal. No respiratory distress. She has no wheezes.  Musculoskeletal: Normal range of motion. She exhibits no edema or tenderness.  Neurological: She is alert and oriented to person, place, and time. Coordination normal.  Skin: Skin is warm and dry. No rash noted. She is not diaphoretic.  Psychiatric: She has a normal mood and affect. Her behavior is normal.  Vitals reviewed.     Assessment & Plan:  Problem List Items Addressed This Visit    None    Visit Diagnoses    Tonsil stone    -  Primary   Relevant Medications   predniSONE (DELTASONE) 20 MG tablet   Other Relevant Orders   Ambulatory referral to ENT   Tonsillar hypertrophy       Relevant Medications   predniSONE (DELTASONE) 20 MG tablet   Other Relevant Orders   Ambulatory referral to ENT   Chronic pharyngitis       Relevant Medications    predniSONE (DELTASONE) 20 MG tablet   Other Relevant Orders   Ambulatory referral to ENT       Follow up plan: Return if symptoms worsen or fail to improve.  Counseling provided for all of the vaccine components Orders Placed This Encounter  Procedures  . Ambulatory referral to ENT    Caryl Pina, MD [facility] 08/19/2016, 12:10 PM

## 2016-10-19 ENCOUNTER — Ambulatory Visit: Payer: Medicaid Other | Admitting: Family Medicine

## 2016-10-21 ENCOUNTER — Encounter: Payer: Self-pay | Admitting: Family Medicine

## 2016-12-09 ENCOUNTER — Encounter (HOSPITAL_COMMUNITY): Payer: Self-pay | Admitting: *Deleted

## 2016-12-09 ENCOUNTER — Emergency Department (HOSPITAL_COMMUNITY)
Admission: EM | Admit: 2016-12-09 | Discharge: 2016-12-09 | Disposition: A | Discharge status: Home / Self Care | Payer: Medicaid Other | Attending: Emergency Medicine | Admitting: Emergency Medicine

## 2016-12-09 ENCOUNTER — Emergency Department (HOSPITAL_COMMUNITY): Payer: Medicaid Other

## 2016-12-09 DIAGNOSIS — Z950 Presence of cardiac pacemaker: Secondary | ICD-10-CM | POA: Diagnosis not present

## 2016-12-09 DIAGNOSIS — F17210 Nicotine dependence, cigarettes, uncomplicated: Secondary | ICD-10-CM | POA: Diagnosis not present

## 2016-12-09 DIAGNOSIS — Z8673 Personal history of transient ischemic attack (TIA), and cerebral infarction without residual deficits: Secondary | ICD-10-CM | POA: Insufficient documentation

## 2016-12-09 DIAGNOSIS — Z79899 Other long term (current) drug therapy: Secondary | ICD-10-CM | POA: Diagnosis not present

## 2016-12-09 DIAGNOSIS — J441 Chronic obstructive pulmonary disease with (acute) exacerbation: Secondary | ICD-10-CM | POA: Diagnosis not present

## 2016-12-09 DIAGNOSIS — R070 Pain in throat: Secondary | ICD-10-CM | POA: Diagnosis present

## 2016-12-09 DIAGNOSIS — I10 Essential (primary) hypertension: Secondary | ICD-10-CM | POA: Insufficient documentation

## 2016-12-09 DIAGNOSIS — Z85850 Personal history of malignant neoplasm of thyroid: Secondary | ICD-10-CM | POA: Diagnosis not present

## 2016-12-09 DIAGNOSIS — J020 Streptococcal pharyngitis: Secondary | ICD-10-CM | POA: Insufficient documentation

## 2016-12-09 LAB — RAPID STREP SCREEN (MED CTR MEBANE ONLY): Streptococcus, Group A Screen (Direct): NEGATIVE

## 2016-12-09 MED ORDER — DOXYCYCLINE HYCLATE 100 MG PO CAPS: 100.0000 mg | ORAL_CAPSULE | Freq: Two times a day (BID) | ORAL | 0 refills | Status: DC

## 2016-12-09 MED ORDER — PREDNISONE 20 MG PO TABS: 40.0000 mg | ORAL_TABLET | Freq: Every day | ORAL | 0 refills | Status: AC

## 2016-12-09 MED ORDER — DOXYCYCLINE HYCLATE 100 MG PO TABS
100.0000 mg | ORAL_TABLET | Freq: Once | ORAL | Status: AC
  Administered 2016-12-09: 100 mg via ORAL
  Filled 2016-12-09: qty 1

## 2016-12-09 NOTE — ED Triage Notes (Signed)
Pt reports sore throat x 2 days. Pt states that she began having chills, cough, congestion, nausea and a runny nose. Pt also reports mid back pain when she coughs.

## 2016-12-09 NOTE — ED Provider Notes (Signed)
[facility] DEPT Provider Note   CSN: [id number] Arrival date & time: 12/09/16  1932     History   Chief Complaint Chief Complaint  Patient presents with  . Sore Throat    HPI Alison Wilkinson is a 46 y.o. female with a history of A. fib, COPD, thyroid cancer, hypertension, sick sinus syndrome, pacemaker present who presents today for evaluation of sore throat for the past few days. She today began having chills, "low grade fever" intermittent nausea, no runny nose. She reports some mild upper back pain that is only present when she coughs.  She has no known sick contacts.  She has been having increased amount of sputum, increased sputum purulence, and increased cough.  HPI  Past Medical History:  Diagnosis Date  . A-fib ([place])   . Cancer ([place])    thyroid  . Cardiomyopathy ([place])   . COPD (chronic obstructive pulmonary disease) ([place])   . Dysrhythmia   . GERD (gastroesophageal reflux disease)   . Headache   . High cholesterol   . History of kidney stones   . Hypertension   . Hyperthyroidism   . Pacemaker    biotronic  PPM   DR. [place]   . Pneumonia    hx  . PONV (postoperative nausea and vomiting)   . Shortness of breath dyspnea    WITH EXERTION   . Sleep apnea    mild case no cpap reccommended  . SSS (sick sinus syndrome) ([place])   . Stroke [facility])    2009   afib  (none in years)  . Syncope     Patient Active Problem List   Diagnosis Date Noted  . Acute low back pain 04/09/2016  . Solitary pulmonary nodule 02/05/2016  . History of thyroid cancer 01/08/2016  . Tobacco abuse 05/12/2015  . History of colonic polyps 05/12/2015  . Hypocalcemia 04/28/2015  . H/O total thyroidectomy 04/28/2015  . Hypomagnesemia 04/28/2015  . RUQ abdominal pain 04/17/2015  . Cardiomyopathy ([place]) 12/12/2014  . Hyperthyroidism 11/12/2014  . 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/11/2014  . Sick sinus syndrome ([place]) 11/11/2014  . Anxiety state 11/11/2014  . History of CVA (cerebrovascular  accident) 11/11/2014  . HTN (hypertension) 05/28/2014    Past Surgical History:  Procedure Laterality Date  . CARDIAC CATHETERIZATION     8/16  . MULTIPLE EXTRACTIONS WITH ALVEOLOPLASTY Bilateral 08/13/2016   Procedure: MULTIPLE EXTRACTIONS;  Surgeon: Diona Browner, DDS;  Location: [facility];  Service: Oral Surgery;  Laterality: Bilateral;  . PACEMAKER INSERTION  2008  . THYROIDECTOMY Bilateral 04/25/2015   Procedure: THYROIDECTOMY;  Surgeon: Melida Quitter, MD;  Location: [facility];  Service: ENT;  Laterality: Bilateral;  TOTAL THYROIDECTOMY  . TUMOR REMOVAL  2013   UTERUS    OB History    No data available       Home Medications    Prior to Admission medications   Medication Sig Start Date End Date Taking? Authorizing Provider  albuterol (PROVENTIL HFA;VENTOLIN HFA) 108 (90 Base) MCG/ACT inhaler Inhale 1-2 puffs into the lungs every 6 (six) hours as needed for wheezing or shortness of breath.    [provider]  albuterol (PROVENTIL) (2.5 MG/3ML) 0.083% nebulizer solution Take 3 mLs (2.5 mg total) by nebulization every 6 (six) hours as needed for wheezing or shortness of breath. 04/19/16   Eustaquio Maize, MD  doxycycline (VIBRAMYCIN) 100 MG capsule Take 1 capsule (100 mg total) by mouth 2 (two) times daily. 12/09/16   Wyn Quaker  W, PA-C  HYDROcodone-acetaminophen (NORCO) 5-325 MG tablet Take 1 tablet by mouth every 6 (six) hours as needed for moderate pain. 08/13/16   Diona Browner, DDS  levothyroxine (SYNTHROID, LEVOTHROID) 125 MCG tablet Take 125 mcg by mouth once daily before breakfast 12/19/15 12/18/16  [provider]  metoprolol succinate (TOPROL-XL) 25 MG 24 hr tablet Take 25 mg by mouth daily.     [provider]  omeprazole (PRILOSEC) 40 MG capsule Take 40 mg by mouth 2 (two) times daily.    [provider]  predniSONE (DELTASONE) 20 MG tablet Take 2 tablets (40 mg total) by mouth daily with breakfast. 12/09/16 12/14/16  Lorin Glass,  PA-C  tiotropium (SPIRIVA) 18 MCG inhalation capsule Place 18 mcg into inhaler and inhale daily.    [provider]    Family History Family History  Problem Relation Age of Onset  . Diabetes Mother   . Hyperlipidemia Mother   . Hypertension Mother   . Hypertension Father   . Stroke Father   . Hyperlipidemia Sister   . Hypertension Sister   . Thyroid disease Sister   . Hypertension Daughter   . Mental illness Daughter     Social History Social History  Substance Use Topics  . Smoking status: Current Every Day Smoker    Packs/day: 0.50    Years: 35.00    Types: Cigarettes  . Smokeless tobacco: Former Systems developer  . Alcohol use No     Comment: NONE IN 6 YEARS     Allergies   Carvedilol and Lisinopril   Review of Systems Review of Systems  Constitutional: Positive for chills and fatigue. Negative for fever.  HENT: Positive for congestion, postnasal drip, rhinorrhea, sinus pressure and sore throat. Negative for drooling, ear pain, sinus pain and trouble swallowing.   Respiratory: Positive for cough and shortness of breath (Not increased from her normal baseline).   Gastrointestinal: Positive for nausea. Negative for abdominal pain and vomiting.  Musculoskeletal: Positive for arthralgias and back pain.     Physical Exam Updated Vital Signs BP 128/82 (BP Location: Right Arm)   Pulse 82   Temp 98.4 F (36.9 C) (Oral)   Resp 20   Ht 5' 6.5" (1.689 m)   Wt 111.1 kg (245 lb)   LMP 11/21/2016   SpO2 100%   BMI 38.95 kg/m   Physical Exam  Constitutional: She appears well-developed and well-nourished.  HENT:  Head: Normocephalic and atraumatic.  Right Ear: Tympanic membrane, external ear and ear canal normal.  Left Ear: Tympanic membrane, external ear and ear canal normal.  Nose: Rhinorrhea present. No sinus tenderness. Right sinus exhibits no maxillary sinus tenderness and no frontal sinus tenderness. Left sinus exhibits no maxillary sinus tenderness and no  frontal sinus tenderness.  Mouth/Throat: Uvula is midline and mucous membranes are normal. No trismus in the jaw. No dental abscesses. Oropharyngeal exudate, posterior oropharyngeal edema and posterior oropharyngeal erythema present. No tonsillar abscesses. Tonsils are 3+ on the right. Tonsils are 3+ on the left. Tonsillar exudate.  Eyes: Conjunctivae are normal.  Neck: Normal range of motion. Neck supple. No JVD present.  Cardiovascular: Normal rate.   Pulmonary/Chest: Effort normal and breath sounds normal. No stridor. No respiratory distress. She has no wheezes. She has no rales. She exhibits tenderness.  Lymphadenopathy:    She has no cervical adenopathy.  Neurological: She is alert.  Skin: Skin is warm and dry. No rash noted. She is not diaphoretic.  Psychiatric: She has a normal  mood and affect. Her behavior is normal.  Nursing note and vitals reviewed.    ED Treatments / Results  Labs (all labs ordered are listed, but only abnormal results are displayed) Labs Reviewed  RAPID STREP SCREEN (NOT AT [facility])  CULTURE, GROUP A STREP [facility])    EKG  EKG Interpretation None       Radiology Dg Chest 2 View  Result Date: 12/09/2016 CLINICAL DATA:  Cough and fever. Cough for 1 year, increasing and productive last night. Shortness of breath. EXAM: CHEST  2 VIEW COMPARISON:  12/02/2016 FINDINGS: Left-sided pacemaker remains in place with leads projecting over the right atrium and ventricle. The cardiomediastinal contours are normal. The lungs are clear. Pulmonary vasculature is normal. No consolidation, pleural effusion, or pneumothorax. No acute osseous abnormalities are seen. IMPRESSION: No acute abnormality.  No change from prior exam. Electronically Signed   By: Jeb Levering M.D.   On: 12/09/2016 21:03    Procedures Procedures (including critical care time)  Medications Ordered in ED Medications  doxycycline (VIBRA-TABS) tablet 100 mg (100 mg Oral Given 12/09/16 2200)      Initial Impression / Assessment and Plan / ED Course  I have reviewed the triage vital signs and the nursing notes.  Pertinent labs & imaging results that were available during my care of the patient were reviewed by me and considered in my medical decision making (see chart for details).    [name] presents for evaluation of cough, and sore throat. She has had increased sputum production, increased sputum purulence, and increased cough.  Chest x-ray was obtained without acute abnormalities.  Rapid strep was obtained and negative, strep culture sent.  Based on her COPD history, she meets criteria for acute exacerbation of chronic COPD, most likely secondary to URI.  We'll treat with steroids. Patient refused to take 60 mg of prednisone, stating that it makes her feel "funny" however agreed to take 40 mg.  As well as azithromycin is generally first line for this, chart review showed that her EKG has been elongated in the past, and with her cardiac history I feel like doxycycline is a super choice for her. She will be started on doxycycline for acute exacerbation of chronic bronchitis.  She was given her first dose of doxycycline here in the emergency room.  Based on how her throat looks she was offered steroids injection, however declined that at this time.     Final Clinical Impressions(s) / ED Diagnoses   Final diagnoses:  Strep throat  Acute exacerbation of chronic obstructive pulmonary disease (COPD) ([place])    New Prescriptions Discharge Medication List as of 12/09/2016  9:52 PM    START taking these medications   Details  doxycycline (VIBRAMYCIN) 100 MG capsule Take 1 capsule (100 mg total) by mouth 2 (two) times daily., Starting Thu 12/09/2016, Print         Lorin Glass, PA-C 12/09/16 2219    Nat Christen, MD 12/11/16 1145

## 2016-12-09 NOTE — Discharge Instructions (Signed)
If you have any concerns please return to the emergency room for repeat evaluation.

## 2016-12-12 LAB — CULTURE, GROUP A STREP (THRC)

## 2016-12-21 ENCOUNTER — Encounter: Payer: Self-pay | Admitting: Family Medicine

## 2016-12-21 ENCOUNTER — Ambulatory Visit (INDEPENDENT_AMBULATORY_CARE_PROVIDER_SITE_OTHER): Payer: Medicaid Other | Admitting: Family Medicine

## 2016-12-21 VITALS — BP 131/88 | HR 65 | Temp 97.7°F | Ht 66.5 in | Wt 245.2 lb

## 2016-12-21 DIAGNOSIS — E0590 Thyrotoxicosis, unspecified without thyrotoxic crisis or storm: Secondary | ICD-10-CM | POA: Diagnosis not present

## 2016-12-21 DIAGNOSIS — J449 Chronic obstructive pulmonary disease, unspecified: Secondary | ICD-10-CM

## 2016-12-21 DIAGNOSIS — R911 Solitary pulmonary nodule: Secondary | ICD-10-CM

## 2016-12-21 DIAGNOSIS — I10 Essential (primary) hypertension: Secondary | ICD-10-CM

## 2016-12-21 MED ORDER — OMEPRAZOLE 40 MG PO CPDR: 40.0000 mg | DELAYED_RELEASE_CAPSULE | Freq: Every day | ORAL | 3 refills | Status: DC

## 2016-12-21 MED ORDER — AEROCHAMBER PLUS FLO-VU MEDIUM MISC
1.0000 | Freq: Once | 0 refills | Status: AC
Start: 2016-12-21 — End: 2016-12-21

## 2016-12-21 MED ORDER — BUDESONIDE-FORMOTEROL FUMARATE 160-4.5 MCG/ACT IN AERO: 2.0000 | INHALATION_SPRAY | Freq: Two times a day (BID) | RESPIRATORY_TRACT | 3 refills | Status: DC

## 2016-12-21 NOTE — Progress Notes (Signed)
   HPI  Patient presents today here with shortness of breath.  Patient explains that she was seen on September 6 at [facility] for shortness of breath/COPD, she was then seen again on September 13 at [facility].  Not like steroids and does not take any systemic steroids. She did not start spiriva when it was prescribed last time  She states that she has now stopped smoking over the last 1 week. She has had a very good improvement of her sore throat after finishing doxycycline.  PMH: Smoking status noted ROS: Per HPI  Objective: BP 131/88   Pulse 65   Temp 97.7 F (36.5 C) (Oral)   Ht 5' 6.5" (1.689 m)   Wt 245 lb 3.2 oz (111.2 kg)   LMP 11/21/2016   SpO2 99%   BMI 38.98 kg/m  Gen: NAD, alert, cooperative with exam HEENT: NCAT CV: RRR, good S1/S2, no murmur Resp: CTABL, no wheezes, non-labored, frequent cough Ext: No edema, warm Neuro: Alert and oriented, No gross deficits  Assessment and plan:  # COPD Uncontrolled, exacerbation previously, now returned to uncontrolled baseline Artery Symbicort today Continue albuterol  # Pulmonary nodule Found on previous CT  In November 2017, needs follow-up, CTchest ordered  # hyper tension Well-controlled, labs today No changes  Hypothyroidism patient status post thyroidectomy, treated with Synthroid TSH    Orders Placed This Encounter  Procedures  . CT Chest Wo Contrast    Standing Status:   Future    Standing Expiration Date:   02/20/2018    Order Specific Question:   Is patient pregnant?    Answer:   No    Order Specific Question:   Preferred imaging location?    Answer:   [facility]    Order Specific Question:   Radiology Contrast Protocol - do NOT remove file path    Answer:   [email or web address]  . Lipid panel  . CMP14+EGFR  . CBC with Differential/Platelet  . TSH    Meds ordered this encounter  Medications  . budesonide-formoterol (SYMBICORT) 160-4.5 MCG/ACT  inhaler    Sig: Inhale 2 puffs into the lungs 2 (two) times daily.    Dispense:  1 Inhaler    Refill:  3  . Spacer/Aero-Holding Chambers (AEROCHAMBER PLUS FLO-VU MEDIUM) MISC    Sig: 1 each by Other route once.    Dispense:  2 each    Refill:  0    Not brand specific, any covered by insurance is good.    Laroy Apple, MD [facility] 12/21/2016, 11:46 AM

## 2016-12-21 NOTE — Patient Instructions (Signed)
Great to see you!  Start symbicort 2 puffs twice daily to help your breathing.   Congratulations on stopping smoking!

## 2016-12-21 NOTE — Addendum Note (Signed)
Addended by: Timmothy Euler on: 12/21/2016 11:57 AM   Modules accepted: Orders

## 2016-12-22 LAB — CBC WITH DIFFERENTIAL/PLATELET
Basophils Absolute: 0 10*3/uL (ref 0.0–0.2)
Basos: 0 %
EOS (ABSOLUTE): 0.1 10*3/uL (ref 0.0–0.4)
EOS: 2 %
HEMATOCRIT: 37.9 % (ref 34.0–46.6)
HEMOGLOBIN: 12.6 g/dL (ref 11.1–15.9)
IMMATURE GRANS (ABS): 0 10*3/uL (ref 0.0–0.1)
IMMATURE GRANULOCYTES: 0 %
Lymphocytes Absolute: 1.9 10*3/uL (ref 0.7–3.1)
Lymphs: 38 %
MCH: 30 pg (ref 26.6–33.0)
MCHC: 33.2 g/dL (ref 31.5–35.7)
MCV: 90 fL (ref 79–97)
MONOCYTES: 8 %
Monocytes Absolute: 0.4 10*3/uL (ref 0.1–0.9)
NEUTROS PCT: 52 %
Neutrophils Absolute: 2.5 10*3/uL (ref 1.4–7.0)
Platelets: 353 10*3/uL (ref 150–379)
RBC: 4.2 x10E6/uL (ref 3.77–5.28)
RDW: 15 % (ref 12.3–15.4)
WBC: 4.9 10*3/uL (ref 3.4–10.8)

## 2016-12-22 LAB — CMP14+EGFR
ALBUMIN: 4.2 g/dL (ref 3.5–5.5)
ALK PHOS: 49 IU/L (ref 39–117)
ALT: 12 IU/L (ref 0–32)
AST: 16 IU/L (ref 0–40)
Albumin/Globulin Ratio: 1.8 (ref 1.2–2.2)
BUN/Creatinine Ratio: 12 (ref 9–23)
BUN: 11 mg/dL (ref 6–24)
Bilirubin Total: 0.2 mg/dL (ref 0.0–1.2)
CALCIUM: 9.2 mg/dL (ref 8.7–10.2)
CO2: 24 mmol/L (ref 20–29)
CREATININE: 0.95 mg/dL (ref 0.57–1.00)
Chloride: 101 mmol/L (ref 96–106)
GFR calc Af Amer: 84 mL/min/{1.73_m2} (ref >59)
GFR, EST NON AFRICAN AMERICAN: 73 mL/min/{1.73_m2} (ref >59)
GLUCOSE: 85 mg/dL (ref 65–99)
Globulin, Total: 2.4 g/dL (ref 1.5–4.5)
Potassium: 4.9 mmol/L (ref 3.5–5.2)
SODIUM: 137 mmol/L (ref 134–144)
Total Protein: 6.6 g/dL (ref 6.0–8.5)

## 2016-12-22 LAB — LIPID PANEL
CHOL/HDL RATIO: 5.4 ratio — AB (ref 0.0–4.4)
Cholesterol, Total: 222 mg/dL — ABNORMAL HIGH (ref 100–199)
HDL: 41 mg/dL (ref >39)
LDL CALC: 157 mg/dL — AB (ref 0–99)
TRIGLYCERIDES: 118 mg/dL (ref 0–149)
VLDL Cholesterol Cal: 24 mg/dL (ref 5–40)

## 2016-12-22 LAB — TSH: TSH: 0.924 u[IU]/mL (ref 0.450–4.500)

## 2016-12-23 ENCOUNTER — Other Ambulatory Visit: Payer: Self-pay | Admitting: Family Medicine

## 2016-12-23 MED ORDER — ATORVASTATIN CALCIUM 20 MG PO TABS: 20.0000 mg | ORAL_TABLET | Freq: Every day | ORAL | 3 refills | Status: DC

## 2016-12-31 ENCOUNTER — Ambulatory Visit (HOSPITAL_COMMUNITY)
Admission: RE | Admit: 2016-12-31 | Discharge: 2016-12-31 | Disposition: A | Discharge status: Home / Self Care | Payer: Medicaid Other | Source: Ambulatory Visit | Attending: Family Medicine | Admitting: Family Medicine

## 2016-12-31 DIAGNOSIS — Z87891 Personal history of nicotine dependence: Secondary | ICD-10-CM | POA: Insufficient documentation

## 2016-12-31 DIAGNOSIS — R911 Solitary pulmonary nodule: Secondary | ICD-10-CM | POA: Diagnosis present

## 2017-01-31 ENCOUNTER — Ambulatory Visit (INDEPENDENT_AMBULATORY_CARE_PROVIDER_SITE_OTHER): Payer: Medicaid Other | Admitting: Family Medicine

## 2017-01-31 ENCOUNTER — Telehealth: Payer: Self-pay | Admitting: Family Medicine

## 2017-01-31 ENCOUNTER — Encounter: Payer: Self-pay | Admitting: Family Medicine

## 2017-01-31 VITALS — BP 156/81 | HR 76 | Temp 97.8°F | Resp 18 | Ht 66.5 in | Wt 255.0 lb

## 2017-01-31 DIAGNOSIS — R0789 Other chest pain: Secondary | ICD-10-CM | POA: Diagnosis not present

## 2017-01-31 DIAGNOSIS — J440 Chronic obstructive pulmonary disease with acute lower respiratory infection: Secondary | ICD-10-CM | POA: Diagnosis not present

## 2017-01-31 DIAGNOSIS — J0100 Acute maxillary sinusitis, unspecified: Secondary | ICD-10-CM

## 2017-01-31 DIAGNOSIS — J441 Chronic obstructive pulmonary disease with (acute) exacerbation: Secondary | ICD-10-CM | POA: Diagnosis not present

## 2017-01-31 DIAGNOSIS — R002 Palpitations: Secondary | ICD-10-CM

## 2017-01-31 DIAGNOSIS — Z87891 Personal history of nicotine dependence: Secondary | ICD-10-CM | POA: Diagnosis not present

## 2017-01-31 DIAGNOSIS — I10 Essential (primary) hypertension: Secondary | ICD-10-CM | POA: Diagnosis not present

## 2017-01-31 DIAGNOSIS — R197 Diarrhea, unspecified: Secondary | ICD-10-CM | POA: Diagnosis not present

## 2017-01-31 DIAGNOSIS — J209 Acute bronchitis, unspecified: Secondary | ICD-10-CM

## 2017-01-31 MED ORDER — CEFDINIR 300 MG PO CAPS: 300.0000 mg | ORAL_CAPSULE | Freq: Two times a day (BID) | ORAL | 0 refills | Status: DC

## 2017-01-31 NOTE — Progress Notes (Signed)
BP (!) 156/81 (BP Location: Left Arm, Patient Position: Sitting, Cuff Size: Large)   Pulse 76   Temp 97.8 F (36.6 C) (Oral)   Resp 18   Ht 5' 6.5" (1.689 m)   Wt 255 lb (115.7 kg)   SpO2 100%   BMI 40.54 kg/m    Subjective:    Patient ID: Alison Wilkinson, female    DOB: 02/26/1971, 46 y.o.   MRN: [id number]  HPI: Alison Wilkinson is a 46 y.o. female presenting on 01/31/2017 for Palpitations (started last night, some nausea, lightheadedness, feeling of "uneasiness", SOB)   HPI Palpitations and uneasiness and shortness of breath Patient comes in today with complaints of palpitations and uneasiness and shortness of breath and chest pressure that started last night.  When this was happening she took her blood pressure and heart rate with her machine and it showed 160/100 with a heart rate of 130.  She has had episodes of intermittent A. fib previously.  She has a dual ventricular pacer.  She also complains of 3 days worth of cough and congestion and some wheezing.  She went Saturday to an urgent care and got Flonase and azithromycin but she did not take the azithromycin because of her heart history.  She says that she had her pacemaker interrogated 2 weeks ago and has a cardiologist in [place] system and her last catheterization was sometime last year but she cannot remember she has had any stress test since.  She did say she had an echocardiogram this year.  She is also been using Vicks and albuterol to help and they have been helping with the cold symptoms some but not this uneasiness that she has been having.  She feels short of breath on exertion almost all the time because of COPD but now she is feeling at rest as well.  She has been diagnosed with sick sinus syndrome in the past and is concerned about her heart and whether or not it could be related to something along those lines.  Relevant past medical, surgical, family and social history reviewed and updated as indicated. Interim medical  history since our last visit reviewed. Allergies and medications reviewed and updated.  Review of Systems  Constitutional: Negative for chills and fever.  HENT: Positive for congestion, postnasal drip, rhinorrhea, sinus pressure, sneezing and sore throat. Negative for ear discharge and ear pain.   Eyes: Negative for pain, redness and visual disturbance.  Respiratory: Positive for cough, chest tightness, shortness of breath and wheezing.   Cardiovascular: Positive for chest pain and palpitations. Negative for leg swelling.  Gastrointestinal: Negative for abdominal pain.  Musculoskeletal: Negative for back pain and gait problem.  Skin: Negative for rash.  Neurological: Negative for light-headedness and headaches.  Psychiatric/Behavioral: Negative for agitation and behavioral problems. The patient is nervous/anxious.   All other systems reviewed and are negative.   Per HPI unless specifically indicated above        Objective:    BP (!) 156/81 (BP Location: Left Arm, Patient Position: Sitting, Cuff Size: Large)   Pulse 76   Temp 97.8 F (36.6 C) (Oral)   Resp 18   Ht 5' 6.5" (1.689 m)   Wt 255 lb (115.7 kg)   SpO2 100%   BMI 40.54 kg/m   Wt Readings from Last 3 Encounters:  01/31/17 255 lb (115.7 kg)  12/21/16 245 lb 3.2 oz (111.2 kg)  12/09/16 245 lb (111.1 kg)    Physical Exam  Constitutional: She  is oriented to person, place, and time. She appears well-developed and well-nourished. No distress.  Eyes: Conjunctivae are normal.  Neck: Neck supple. No thyromegaly present.  Cardiovascular: Normal rate, regular rhythm, normal heart sounds and intact distal pulses.  No murmur heard. Pulmonary/Chest: Effort normal and breath sounds normal. No respiratory distress. She has no wheezes. She has no rales. She exhibits no tenderness (No reproducible tenderness).  Abdominal: Soft. Bowel sounds are normal. She exhibits no distension. There is no tenderness. There is no rebound.    Musculoskeletal: Normal range of motion. She exhibits no edema.  Lymphadenopathy:    She has no cervical adenopathy.  Neurological: She is alert and oriented to person, place, and time. Coordination normal.  Skin: Skin is warm and dry. No rash noted. She is not diaphoretic.  Psychiatric: She has a normal mood and affect. Her behavior is normal.  Nursing note and vitals reviewed.   EKG: Normal sinus rhythm at a rate of 78, no pacer signals are noted on EKG    Assessment & Plan:   Problem List Items Addressed This Visit    None    Visit Diagnoses    Palpitation    -  Primary   Relevant Orders   EKG 12-Lead (Completed)   Chest tightness or pressure       Acute bronchitis with COPD ([place])         Recommended for patient to go to the emergency department for evaluation concerning her heart.  She wants to go to [facility].  Follow up plan: Return if symptoms worsen or fail to improve.  Counseling provided for all of the vaccine components Orders Placed This Encounter  Procedures  . EKG 12-Lead    Caryl Pina, MD [facility] 01/31/2017, 9:29 AM

## 2017-01-31 NOTE — Telephone Encounter (Signed)
Patient aware.

## 2017-01-31 NOTE — Telephone Encounter (Signed)
Per pt she was not given antibiotic at ER Pt was told symptoms were from URI Please review and advise

## 2017-02-05 ENCOUNTER — Emergency Department (HOSPITAL_COMMUNITY)
Admission: EM | Admit: 2017-02-05 | Discharge: 2017-02-05 | Disposition: A | Discharge status: Home / Self Care | Payer: Medicaid Other | Attending: Emergency Medicine | Admitting: Emergency Medicine

## 2017-02-05 ENCOUNTER — Encounter (HOSPITAL_COMMUNITY): Payer: Self-pay

## 2017-02-05 DIAGNOSIS — R450 Nervousness: Secondary | ICD-10-CM | POA: Diagnosis present

## 2017-02-05 DIAGNOSIS — Z8673 Personal history of transient ischemic attack (TIA), and cerebral infarction without residual deficits: Secondary | ICD-10-CM | POA: Diagnosis not present

## 2017-02-05 DIAGNOSIS — Z87891 Personal history of nicotine dependence: Secondary | ICD-10-CM | POA: Diagnosis not present

## 2017-02-05 DIAGNOSIS — J449 Chronic obstructive pulmonary disease, unspecified: Secondary | ICD-10-CM | POA: Diagnosis not present

## 2017-02-05 DIAGNOSIS — Z85850 Personal history of malignant neoplasm of thyroid: Secondary | ICD-10-CM | POA: Diagnosis not present

## 2017-02-05 DIAGNOSIS — F419 Anxiety disorder, unspecified: Secondary | ICD-10-CM | POA: Diagnosis not present

## 2017-02-05 DIAGNOSIS — Z79899 Other long term (current) drug therapy: Secondary | ICD-10-CM | POA: Insufficient documentation

## 2017-02-05 DIAGNOSIS — I10 Essential (primary) hypertension: Secondary | ICD-10-CM | POA: Insufficient documentation

## 2017-02-05 DIAGNOSIS — Z950 Presence of cardiac pacemaker: Secondary | ICD-10-CM | POA: Insufficient documentation

## 2017-02-05 DIAGNOSIS — Z6379 Other stressful life events affecting family and household: Secondary | ICD-10-CM | POA: Insufficient documentation

## 2017-02-05 LAB — COMPREHENSIVE METABOLIC PANEL
ALK PHOS: 56 U/L (ref 38–126)
ALT: 14 U/L (ref 14–54)
AST: 16 U/L (ref 15–41)
Albumin: 4.3 g/dL (ref 3.5–5.0)
Anion gap: 7 (ref 5–15)
BILIRUBIN TOTAL: 0.8 mg/dL (ref 0.3–1.2)
BUN: 12 mg/dL (ref 6–20)
CO2: 28 mmol/L (ref 22–32)
CREATININE: 0.82 mg/dL (ref 0.44–1.00)
Calcium: 9.2 mg/dL (ref 8.9–10.3)
Chloride: 102 mmol/L (ref 101–111)
GFR calc Af Amer: >60 mL/min (ref >60)
GFR calc non Af Amer: >60 mL/min (ref >60)
Glucose, Bld: 109 mg/dL — ABNORMAL HIGH (ref 65–99)
Potassium: 4.1 mmol/L (ref 3.5–5.1)
Sodium: 137 mmol/L (ref 135–145)
TOTAL PROTEIN: 8.2 g/dL — AB (ref 6.5–8.1)

## 2017-02-05 LAB — RAPID URINE DRUG SCREEN, HOSP PERFORMED
Amphetamines: NOT DETECTED
Barbiturates: NOT DETECTED
Benzodiazepines: NOT DETECTED
Cocaine: NOT DETECTED
OPIATES: NOT DETECTED
Tetrahydrocannabinol: NOT DETECTED

## 2017-02-05 LAB — CBC WITH DIFFERENTIAL/PLATELET
BASOS ABS: 0 10*3/uL (ref 0.0–0.1)
Basophils Relative: 1 %
Eosinophils Absolute: 0.1 10*3/uL (ref 0.0–0.7)
Eosinophils Relative: 1 %
HEMATOCRIT: 40.2 % (ref 36.0–46.0)
Hemoglobin: 13.1 g/dL (ref 12.0–15.0)
LYMPHS PCT: 29 %
Lymphs Abs: 1.8 10*3/uL (ref 0.7–4.0)
MCH: 28.9 pg (ref 26.0–34.0)
MCHC: 32.6 g/dL (ref 30.0–36.0)
MCV: 88.5 fL (ref 78.0–100.0)
MONO ABS: 0.4 10*3/uL (ref 0.1–1.0)
Monocytes Relative: 6 %
NEUTROS ABS: 4.1 10*3/uL (ref 1.7–7.7)
Neutrophils Relative %: 63 %
Platelets: 336 10*3/uL (ref 150–400)
RBC: 4.54 MIL/uL (ref 3.87–5.11)
RDW: 14.2 % (ref 11.5–15.5)
WBC: 6.3 10*3/uL (ref 4.0–10.5)

## 2017-02-05 LAB — ETHANOL

## 2017-02-05 LAB — TSH: TSH: 0.786 u[IU]/mL (ref 0.350–4.500)

## 2017-02-05 MED ORDER — LORAZEPAM 1 MG PO TABS
2.0000 mg | ORAL_TABLET | Freq: Once | ORAL | Status: AC
  Administered 2017-02-05: 2 mg via ORAL
  Filled 2017-02-05: qty 2

## 2017-02-05 MED ORDER — LORAZEPAM 1 MG PO TABS: 1.0000 mg | ORAL_TABLET | Freq: Two times a day (BID) | ORAL | 0 refills | Status: DC | PRN

## 2017-02-05 NOTE — ED Notes (Signed)
Got sodas for family

## 2017-02-05 NOTE — ED Provider Notes (Signed)
[facility] EMERGENCY DEPARTMENT Provider Note   CSN: [id number] Arrival date & time: 02/05/17  0757     History   Chief Complaint Chief Complaint  Patient presents with  . Anxiety    HPI Alison Wilkinson is a 46 y.o. female.  Patient reports feeling anxious for the past 2 days described as a "panic attack".  She has a history of hyperthyroidism ultimately required surgery.  She is now on thyroid replacement.  Her Synthroid was recently increased from 125-137 mcg/day.  No chest pain, dyspnea, fever, sweats, chills, suicidal or homicidal ideation.  She has not been to see a therapist.  She is raising 3 of her grandchildren.      Past Medical History:  Diagnosis Date  . A-fib ([place])   . Cancer ([place])    thyroid  . Cardiomyopathy ([place])   . COPD (chronic obstructive pulmonary disease) ([place])   . Dysrhythmia   . GERD (gastroesophageal reflux disease)   . Headache   . High cholesterol   . History of kidney stones   . Hypertension   . Hyperthyroidism   . Pacemaker    biotronic  PPM   DR. Collbran   . Pneumonia    hx  . PONV (postoperative nausea and vomiting)   . Shortness of breath dyspnea    WITH EXERTION   . Sleep apnea    mild case no cpap reccommended  . SSS (sick sinus syndrome) ([place])   . Stroke [facility])    2009   afib  (none in years)  . Syncope     Patient Active Problem List   Diagnosis Date Noted  . Acute low back pain 04/09/2016  . Solitary pulmonary nodule 02/05/2016  . History of thyroid cancer 01/08/2016  . Tobacco abuse 05/12/2015  . History of colonic polyps 05/12/2015  . Hypocalcemia 04/28/2015  . H/O total thyroidectomy 04/28/2015  . Hypomagnesemia 04/28/2015  . RUQ abdominal pain 04/17/2015  . Cardiomyopathy ([place]) 12/12/2014  . Hyperthyroidism 11/12/2014  . 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/11/2014  . Sick sinus syndrome ([place]) 11/11/2014  . Anxiety state 11/11/2014  . History of CVA (cerebrovascular accident) 11/11/2014  . HTN (hypertension)  05/28/2014    Past Surgical History:  Procedure Laterality Date  . CARDIAC CATHETERIZATION     8/16  . PACEMAKER INSERTION  2008  . TUMOR REMOVAL  2013   UTERUS    OB History    No data available       Home Medications    Prior to Admission medications   Medication Sig Start Date End Date Taking? Authorizing Provider  acetaminophen (TYLENOL) 500 MG tablet Take 1,500 mg every 6 (six) hours as needed by mouth for headache.   Yes [provider]  albuterol (PROVENTIL HFA;VENTOLIN HFA) 108 (90 Base) MCG/ACT inhaler Inhale 1-2 puffs into the lungs every 6 (six) hours as needed for wheezing or shortness of breath.   Yes [provider]  albuterol (PROVENTIL) (2.5 MG/3ML) 0.083% nebulizer solution Take 3 mLs (2.5 mg total) by nebulization every 6 (six) hours as needed for wheezing or shortness of breath. 04/19/16  Yes Eustaquio Maize, MD  clonazePAM (KLONOPIN) 0.5 MG tablet Take 0.5 mg daily as needed by mouth for anxiety.   Yes [provider]  levothyroxine (SYNTHROID, LEVOTHROID) 137 MCG tablet Take 137 mcg by mouth once daily before breakfast 12/19/15 02/05/17 Yes [provider]  metoprolol succinate (TOPROL-XL) 25 MG 24 hr tablet Take 25 mg by mouth  daily.    Yes [provider]  omeprazole (PRILOSEC) 40 MG capsule Take 1 capsule (40 mg total) by mouth daily. 12/21/16  Yes Timmothy Euler, MD  atorvastatin (LIPITOR) 20 MG tablet Take 1 tablet (20 mg total) by mouth daily. Patient not taking: Reported on 01/31/2017 12/23/16   Timmothy Euler, MD  budesonide-formoterol [facility]) 160-4.5 MCG/ACT inhaler Inhale 2 puffs into the lungs 2 (two) times daily. Patient not taking: Reported on 01/31/2017 12/21/16   Timmothy Euler, MD  cefdinir (OMNICEF) 300 MG capsule Take 1 capsule (300 mg total) 2 (two) times daily by mouth. 1 po BID Patient not taking: Reported on 02/05/2017 01/31/17   Dettinger, Fransisca Kaufmann, MD  LORazepam (ATIVAN) 1 MG tablet  Take 1 tablet (1 mg total) 2 (two) times daily as needed by mouth for anxiety. 02/05/17   Nat Christen, MD    Family History Family History  Problem Relation Age of Onset  . Diabetes Mother   . Hyperlipidemia Mother   . Hypertension Mother   . Hypertension Father   . Stroke Father   . Hyperlipidemia Sister   . Hypertension Sister   . Thyroid disease Sister   . Hypertension Daughter   . Mental illness Daughter     Social History Social History   Tobacco Use  . Smoking status: Former Smoker    Packs/day: 0.50    Years: 35.00    Pack years: 17.50    Types: Cigarettes    Last attempt to quit: 12/20/2016    Years since quitting: 0.1  . Smokeless tobacco: Former Network engineer Use Topics  . Alcohol use: No    Comment: NONE IN 6 YEARS  . Drug use: No    Comment: OVER 1 YEAR     Allergies   Carvedilol and Lisinopril   Review of Systems Review of Systems  All other systems reviewed and are negative.    Physical Exam Updated Vital Signs BP 128/83   Pulse 64   Temp 98.2 F (36.8 C) (Oral)   Resp 17   Ht 5\' 6"  (1.676 m)   Wt 115.7 kg (255 lb)   LMP 01/05/2017 Comment: irregular  SpO2 99%   BMI 41.16 kg/m   Physical Exam  Constitutional: She is oriented to person, place, and time. She appears well-developed and well-nourished.  HENT:  Head: Normocephalic and atraumatic.  Eyes: Conjunctivae are normal.  Neck: Neck supple.  Cardiovascular: Normal rate and regular rhythm.  Pulmonary/Chest: Effort normal and breath sounds normal.  Abdominal: Soft. Bowel sounds are normal.  Musculoskeletal: Normal range of motion.  Neurological: She is alert and oriented to person, place, and time.  Skin: Skin is warm and dry.  Psychiatric:  Flat affect  Nursing note and vitals reviewed.    ED Treatments / Results  Labs (all labs ordered are listed, but only abnormal results are displayed) Labs Reviewed  COMPREHENSIVE METABOLIC PANEL - Abnormal; Notable for the  following components:      Result Value   Glucose, Bld 109 (*)    Total Protein 8.2 (*)    All other components within normal limits  CBC WITH DIFFERENTIAL/PLATELET  RAPID URINE DRUG SCREEN, HOSP PERFORMED  TSH  ETHANOL    EKG  EKG Interpretation  Date/Time:  Saturday February 05 2017 08:49:40 EST Ventricular Rate:  68 PR Interval:    QRS Duration: 98 QT Interval:  430 QTC Calculation: 458 R Axis:   32 Text Interpretation:  Atrial-paced rhythm Confirmed  by Nat Christen [phone]) on 02/05/2017 10:29:15 AM       Radiology No results found.  Procedures Procedures (including critical care time)  Medications Ordered in ED Medications  LORazepam (ATIVAN) tablet 2 mg (2 mg Oral Given 02/05/17 0900)     Initial Impression / Assessment and Plan / ED Course  I have reviewed the triage vital signs and the nursing notes.  Pertinent labs & imaging results that were available during my care of the patient were reviewed by me and considered in my medical decision making (see chart for details).    Patient is in no acute distress.  She has multiple life stressors at home.  Her TSH was within normal limits.  She responded well to Ativan orally.  Will discharge home with a small prescription for Ativan 1 mg.  Recommend outpatient counseling.   Final Clinical Impressions(s) / ED Diagnoses   Final diagnoses:  Anxiety    ED Discharge Orders        Ordered    LORazepam (ATIVAN) 1 MG tablet  2 times daily PRN     02/05/17 1450       Nat Christen, MD 02/05/17 [phone]

## 2017-02-05 NOTE — ED Triage Notes (Addendum)
Pt reports increased anxiety and frequent panic attacks for the past week.  Denies SI or HI.  Reports thought may be coming from thyroid levels but it was normal when she had it checked.  Pt tearful.  Denies any new stressors. Reports has been having hot flashes and night sweats.

## 2017-02-05 NOTE — Discharge Instructions (Signed)
Blood work was normal including your TSH.  Prescription for anxiety medication.  Follow-up with your primary care doctor or [facility].

## 2017-02-22 ENCOUNTER — Ambulatory Visit: Payer: Medicaid Other | Admitting: Family Medicine

## 2017-02-23 ENCOUNTER — Encounter: Payer: Self-pay | Admitting: Family Medicine

## 2017-04-01 ENCOUNTER — Encounter: Payer: Self-pay | Admitting: Family Medicine

## 2017-04-01 ENCOUNTER — Ambulatory Visit (INDEPENDENT_AMBULATORY_CARE_PROVIDER_SITE_OTHER): Payer: Medicaid Other | Admitting: Family Medicine

## 2017-04-01 DIAGNOSIS — E785 Hyperlipidemia, unspecified: Secondary | ICD-10-CM | POA: Diagnosis not present

## 2017-04-01 DIAGNOSIS — F411 Generalized anxiety disorder: Secondary | ICD-10-CM

## 2017-04-01 MED ORDER — PITAVASTATIN CALCIUM 2 MG PO TABS: 2.0000 mg | ORAL_TABLET | Freq: Every day | ORAL | 3 refills | Status: DC

## 2017-04-01 NOTE — Progress Notes (Signed)
   HPI  Patient presents today follow-up chronic medical conditions.  Patient was seen in November and sent to the emergency room with palpitations, she was felt to have anxiety attack and treated with Ativan.  She states that the anxiety attack broke with 1 pill and she felt better.  She has not used more than 1 pill of the 15 that were prescribed. She is not interested in an SSRI, she tried previously for about 2 months with not significant improvement.  Obesity Patient is frustrated about weight gain, she is interested in a dietitian, she is willing to try statins if absolutely necessary but would like to try diet to decrease cholesterol first.  Patient has a history of nonischemic cardiomyopathy with sick sinus syndrome status post pacemaker placement, now with paroxysmal atrial fibrillation.  She declines anticoagulation and has been following up regularly with cardiology.  Her EF is now improved  PMH: Smoking status noted ROS: Per HPI  Objective: BP 125/81   Pulse 69   Temp 98.2 F (36.8 C) (Oral)   Ht 5\' 6"  (1.676 m)   Wt 257 lb 3.2 oz (116.7 kg)   BMI 41.51 kg/m  Gen: NAD, alert, cooperative with exam HEENT: NCAT CV: RRR, good S1/S2, no murmur Resp: CTABL, no wheezes, non-labored Neuro: Alert and oriented, No gross deficits  Assessment and plan:  #Morbid obesity BMI greater than 40, comorbidities include hypertension and hyperlipidemia. Refer to weight loss clinic, appreciate Dr. Migdalia Dk management  #Anxiety Offered SSRI, she will consider, she has been very prudent in her use of benzodiazepines, continue as needed  #Hyperlipidemia We discussed pros and cons of treatment, her 10-year ASCVD risk is 6.8%, she does not have history of ischemic cardiomyopathy or ischemic event. Trial of livalo, biggest hurdle is cost. Pravastatin would be good as well.  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 Amb Referral to  Bariatric Surgery    Referral Priority:   Routine    Referral Type:   Consultation    Referred to Provider:   Starlyn Skeans, MD    Number of Visits Requested:   1    Meds ordered this encounter  Medications  . Pitavastatin Calcium (LIVALO) 2 MG TABS    Sig: Take 1 tablet (2 mg total) by mouth daily.    Dispense:  90 tablet    Refill:  [place], MD [facility] 04/01/2017, 9:24 AM

## 2017-04-01 NOTE — Patient Instructions (Signed)
Great to see you!  Try livalo 1 pill once daily, be sure to use the coupon when getting it.   Come back for labs and follow up of your cholesterol in 2 months

## 2017-04-05 ENCOUNTER — Telehealth: Payer: Self-pay

## 2017-04-05 NOTE — Telephone Encounter (Signed)
Medicaid non preferred Livalo  Preferred are Atorvastatin , ezetimibe, lovastatin pravastatin rosuvastatin and simvastatin

## 2017-04-07 ENCOUNTER — Telehealth: Payer: Self-pay

## 2017-04-07 MED ORDER — PRAVASTATIN SODIUM 20 MG PO TABS: 20.0000 mg | ORAL_TABLET | Freq: Every day | ORAL | 3 refills | Status: DC

## 2017-04-07 NOTE — Telephone Encounter (Signed)
Pt aware.

## 2017-04-07 NOTE — Telephone Encounter (Signed)
Medicaid non preferred Livalo  Preferred are Atorvastatin, ezetimibe, lovastatin, pravastatin, rosuvastatin and simvastatin

## 2017-04-07 NOTE — Telephone Encounter (Signed)
pravastatin sent instead of livalo.   Laroy Apple, MD [facility] 04/07/2017, 10:55 AM

## 2017-05-25 ENCOUNTER — Encounter (INDEPENDENT_AMBULATORY_CARE_PROVIDER_SITE_OTHER): Payer: Medicaid Other

## 2017-05-31 ENCOUNTER — Ambulatory Visit: Payer: Medicaid Other | Admitting: Family Medicine

## 2017-06-09 ENCOUNTER — Ambulatory Visit (INDEPENDENT_AMBULATORY_CARE_PROVIDER_SITE_OTHER): Payer: Medicaid Other | Admitting: Family Medicine

## 2017-06-09 ENCOUNTER — Encounter (INDEPENDENT_AMBULATORY_CARE_PROVIDER_SITE_OTHER): Payer: Self-pay | Admitting: Family Medicine

## 2017-06-09 VITALS — BP 128/81 | HR 89 | Temp 98.1°F | Ht 66.0 in | Wt 255.0 lb

## 2017-06-09 DIAGNOSIS — J449 Chronic obstructive pulmonary disease, unspecified: Secondary | ICD-10-CM | POA: Diagnosis not present

## 2017-06-09 DIAGNOSIS — Z1331 Encounter for screening for depression: Secondary | ICD-10-CM

## 2017-06-09 DIAGNOSIS — I482 Chronic atrial fibrillation, unspecified: Secondary | ICD-10-CM

## 2017-06-09 DIAGNOSIS — R739 Hyperglycemia, unspecified: Secondary | ICD-10-CM

## 2017-06-09 DIAGNOSIS — E559 Vitamin D deficiency, unspecified: Secondary | ICD-10-CM

## 2017-06-09 DIAGNOSIS — I428 Other cardiomyopathies: Secondary | ICD-10-CM | POA: Diagnosis not present

## 2017-06-09 DIAGNOSIS — Z0289 Encounter for other administrative examinations: Secondary | ICD-10-CM

## 2017-06-09 DIAGNOSIS — R5383 Other fatigue: Secondary | ICD-10-CM | POA: Diagnosis not present

## 2017-06-09 DIAGNOSIS — E538 Deficiency of other specified B group vitamins: Secondary | ICD-10-CM | POA: Diagnosis not present

## 2017-06-09 DIAGNOSIS — E038 Other specified hypothyroidism: Secondary | ICD-10-CM | POA: Diagnosis not present

## 2017-06-09 DIAGNOSIS — Z6841 Body Mass Index (BMI) 40.0 and over, adult: Secondary | ICD-10-CM | POA: Diagnosis not present

## 2017-06-09 NOTE — Progress Notes (Signed)
.  Office: [phone]  /  Fax: [phone]   HPI:   Chief Complaint: Alison Wilkinson (MR# [id number]) is a 47 y.o. female who presents on 06/09/2017 for obesity evaluation and treatment. Current BMI is Body mass index is 41.16 kg/m.Alison Wilkinson [name] has struggled with obesity for years and has been unsuccessful in either losing weight or maintaining long term weight loss. Alison Wilkinson has a history of alcohol and marijuana use and is using currently. Her grand kids are picky eaters and dictate her food choices. Alison Wilkinson attended our information session and states she is currently in the action stage of change and ready to dedicate time achieving and maintaining a healthier weight.  Alison Wilkinson states her family eats meals together she thinks her family will eat healthier with  her her desired weight loss is 78 lbs she has been heavy most of  her life she started gaining weight around 12 yrs of age her heaviest weight ever was 345 lbs. she has significant food cravings issues  she snacks frequently in the evenings she wakes up frequently in the middle of the night to eat she skips meals frequently she is frequently drinking liquids with calories she frequently makes poor food choices she has problems with excessive hunger  she frequently eats larger portions than normal  she has binge eating behaviors she struggles with emotional eating    Fatigue Alison Wilkinson feels her energy is lower than it should be. This has worsened with weight gain and has not worsened recently. Alison Wilkinson admits to daytime somnolence and admits to waking up still tired. Patient is at risk for obstructive sleep apnea. Patent has a history of symptoms of daytime fatigue, morning fatigue and morning headache. Patient generally gets 3 or 4 hours of sleep per night, and states they generally have restless sleep. Snoring is present. Apneic episodes are present. Epworth Sleepiness Score is 14  Dyspnea on exertion [name] notes  increasing shortness of breath with exercising and seems to be worsening over time with weight gain. She notes getting out of breath sooner with activity than she used to. This has not gotten worse recently.  Chronic Obstructive Pulmonary Disease [name] is still smoking with 35 pack per year use and history of stroke, cardiomyopathy and pacemaker. She is on albuterol only, and has been prescribed Spiriva. She refuses to take inhaled steroids per PCP.  Atrial Fibrillation [name] is on 81 mg of aspirin only for anticoagulation and she is on Metoprolol. [name] is followed by cardiology. 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  Hypothyroid Alison Wilkinson has a diagnosis of hypothyroidism. She is status post thyroidectomy due to thyroid cancer. She is on levothyroxine. Alison Wilkinson must be careful to avoid over replacement due to Afib.   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 She is status post pacemaker and now Afib with a history of embolic stroke. She is still currently smoking. [name] is currently taking aspirin.  Vitamin D deficiency Alison Wilkinson has a diagnosis of vitamin D deficiency. There are no recent labs. She is not currently taking multi vitamin. She admits fatigue and denies nausea, vomiting or muscle weakness.  Vitamin B12 Deficiency Alison Wilkinson has a diagnosis of B12 insufficiency and notes fatigue. She is not on multi vitamin.This is a new diagnosis. Alison Wilkinson is not a vegetarian and does not have a previous diagnosis of pernicious anemia. She does not have a history of weight loss surgery.   Hyperglycemia Alison Wilkinson has a diagnosis  of hyperglycemia and is not on medications. There is no recent Hgb A1c result. She admits polyphagia.  Depression Screen Alison Wilkinson's Food and Mood (modified PHQ-9) score was  Depression screen PHQ 2/9 06/09/2017  Decreased Interest 3  Down, Depressed, Hopeless 3  PHQ - 2 Score 6  Altered sleeping  3  Tired, decreased energy 3  Change in appetite 2  Feeling bad or failure about yourself  0  Trouble concentrating 1  Moving slowly or fidgety/restless 1  Suicidal thoughts 0  PHQ-9 Score 16  Difficult doing work/chores Very difficult    ALLERGIES: Allergies  Allergen Reactions  . Carvedilol Palpitations and Shortness Of Breath    Per pt report  . Lisinopril Shortness Of Breath    MEDICATIONS: Current Outpatient Medications on File Prior to Visit  Medication Sig Dispense Refill  . albuterol (PROVENTIL HFA;VENTOLIN HFA) 108 (90 Base) MCG/ACT inhaler Inhale 1-2 puffs into the lungs every 6 (six) hours as needed for wheezing or shortness of breath.    Alison Wilkinson albuterol (PROVENTIL) (2.5 MG/3ML) 0.083% nebulizer solution Take 3 mLs (2.5 mg total) by nebulization every 6 (six) hours as needed for wheezing or shortness of breath. 150 mL 1  . aspirin EC 81 MG tablet Take 81 mg by mouth daily.    . clonazePAM (KLONOPIN) 0.5 MG tablet Take 0.5 mg by mouth 2 (two) times daily as needed for anxiety.    Alison Wilkinson levothyroxine (SYNTHROID, LEVOTHROID) 137 MCG tablet Take 137 mcg by mouth once daily before breakfast    . metoprolol succinate (TOPROL-XL) 25 MG 24 hr tablet Take 25 mg by mouth daily.     Alison Wilkinson omeprazole (PRILOSEC) 40 MG capsule Take 1 capsule (40 mg total) by mouth daily. 90 capsule 3   No current facility-administered medications on file prior to visit.     PAST MEDICAL HISTORY: Past Medical History:  Diagnosis Date  . A-fib ([place])   . Anxiety   . Atrial tachycardia ([place])   . Cancer ([place])    thyroid  . Cardiomyopathy ([place])   . COPD (chronic obstructive pulmonary disease) ([place])   . Dyspnea   . Dysrhythmia   . GERD (gastroesophageal reflux disease)   . Headache   . High cholesterol   . History of kidney stones   . Hypertension   . Hyperthyroidism   . Hypothyroid   . Mixed hyperlipidemia   . Pacemaker    biotronic  PPM   DR. [place]elton   . Pacemaker   . Pneumonia     hx  . PONV (postoperative nausea and vomiting)   . Shortness of breath dyspnea    WITH EXERTION   . Sleep apnea    mild case no cpap reccommended  . SSS (sick sinus syndrome) ([place])   . Stroke [facility])    2009   afib  (none in years)  . Syncope     PAST SURGICAL HISTORY: Past Surgical History:  Procedure Laterality Date  . CARDIAC CATHETERIZATION     8/16  . HYSTEROSCOPY  2015  . MULTIPLE EXTRACTIONS WITH ALVEOLOPLASTY Bilateral 08/13/2016   Procedure: MULTIPLE EXTRACTIONS;  Surgeon: Diona Browner, DDS;  Location: [facility];  Service: Oral Surgery;  Laterality: Bilateral;  . PACEMAKER INSERTION  2008  . THYROIDECTOMY Bilateral 04/25/2015   Procedure: THYROIDECTOMY;  Surgeon: Melida Quitter, MD;  Location: [facility];  Service: ENT;  Laterality: Bilateral;  TOTAL THYROIDECTOMY  . TUMOR REMOVAL  2013   UTERUS    SOCIAL HISTORY:  Social History   Tobacco Use  . Smoking status: Current Every Day Smoker    Packs/day: 0.50    Years: 35.00    Pack years: 17.50    Types: Cigarettes    Last attempt to quit: 12/20/2016    Years since quitting: 0.4  . Smokeless tobacco: Former Network engineer Use Topics  . Alcohol use: No    Comment: NONE IN 6 YEARS  . Drug use: No    Comment: OVER 1 YEAR    FAMILY HISTORY: Family History  Problem Relation Age of Onset  . Diabetes Mother   . Hyperlipidemia Mother   . Hypertension Mother   . Depression Mother   . Heart disease Mother   . Bipolar disorder Mother   . Liver disease Mother   . Sleep apnea Mother   . Obesity Mother   . Hypertension Father   . Stroke Father   . Heart disease Father   . Thyroid disease Father   . Alcoholism Father   . Obesity Father   . Hyperlipidemia Sister   . Hypertension Sister   . Thyroid disease Sister   . Hypertension Daughter   . Mental illness Daughter     ROS: Review of Systems  Constitutional: Positive for malaise/fatigue.  HENT: Positive for congestion (nasal stuffiness) and tinnitus.        Dry Mouth   Eyes:       Vision changes Floaters  Respiratory: Positive for cough, shortness of breath and wheezing.        Painful or Difficulty Breathing  Cardiovascular: Positive for chest pain (chest pain or discomfort) and palpitations.       Shortness of Breath with Activity Difficulty Breathing while Lying Down Sudden Awakening from Sleep with Shortness of Breath Very Cold Feet or Hands   Gastrointestinal: Positive for constipation, diarrhea, nausea and vomiting.  Musculoskeletal: Positive for back pain and neck pain.       Negative for muscle weakness  Skin: Positive for rash.       Dryness   Neurological: Positive for weakness and headaches.  Endo/Heme/Allergies:       Positive for polyphagia Heat or Cold Intolerance  Psychiatric/Behavioral: The patient is nervous/anxious (nervousness) and has insomnia.        Stress     PHYSICAL EXAM: Blood pressure 128/81, pulse 89, temperature 98.1 F (36.7 C), temperature source Oral, height 5\' 6"  (1.676 m), weight 255 lb (115.7 kg), last menstrual period 05/13/2017, SpO2 99 %. Body mass index is 41.16 kg/m. Physical Exam  Constitutional: She is oriented to person, place, and time. She appears well-developed and well-nourished.  HENT:  Head: Normocephalic and atraumatic.  Nose: Nose normal.  Eyes: EOM are normal. No scleral icterus.  Neck: Normal range of motion. Neck supple. No thyromegaly present.  Cardiovascular: Normal rate and regular rhythm.  Pulmonary/Chest: Effort normal. No respiratory distress.  Abdominal: Soft. There is no tenderness.  + obesity  Musculoskeletal: Normal range of motion.  Range of Motion normal in all 4 extremities  Neurological: She is alert and oriented to person, place, and time. Coordination normal.  Skin: Skin is warm and dry.  Psychiatric: She has a normal mood and affect. Her behavior is normal.  Vitals reviewed.   RECENT LABS AND TESTS: BMET    Component Value Date/Time   NA 137 02/05/2017  0900   NA 137 12/21/2016 0934   K 4.1 02/05/2017 0900   CL 102 02/05/2017 0900   CO2 28 02/05/2017  0900   GLUCOSE 109 (H) 02/05/2017 0900   BUN 12 02/05/2017 0900   BUN 11 12/21/2016 0934   CREATININE 0.82 02/05/2017 0900   CALCIUM 9.2 02/05/2017 0900   GFRNONAA >60 02/05/2017 0900   GFRAA >60 02/05/2017 0900   No results found for: HGBA1C No results found for: INSULIN CBC    Component Value Date/Time   WBC 6.3 02/05/2017 0900   RBC 4.54 02/05/2017 0900   HGB 13.1 02/05/2017 0900   HGB 12.6 12/21/2016 0934   HCT 40.2 02/05/2017 0900   HCT 37.9 12/21/2016 0934   PLT 336 02/05/2017 0900   PLT 353 12/21/2016 0934   MCV 88.5 02/05/2017 0900   MCV 90 12/21/2016 0934   MCH 28.9 02/05/2017 0900   MCHC 32.6 02/05/2017 0900   RDW 14.2 02/05/2017 0900   RDW 15.0 12/21/2016 0934   LYMPHSABS 1.8 02/05/2017 0900   LYMPHSABS 1.9 12/21/2016 0934   MONOABS 0.4 02/05/2017 0900   EOSABS 0.1 02/05/2017 0900   EOSABS 0.1 12/21/2016 0934   BASOSABS 0.0 02/05/2017 0900   BASOSABS 0.0 12/21/2016 0934   Iron/TIBC/Ferritin/ %Sat No results found for: IRON, TIBC, FERRITIN, IRONPCTSAT Lipid Panel     Component Value Date/Time   CHOL 222 (H) 12/21/2016 0934   TRIG 118 12/21/2016 0934   HDL 41 12/21/2016 0934   CHOLHDL 5.4 (H) 12/21/2016 0934   LDLCALC 157 (H) 12/21/2016 0934   Hepatic Function Panel     Component Value Date/Time   PROT 8.2 (H) 02/05/2017 0900   PROT 6.6 12/21/2016 0934   ALBUMIN 4.3 02/05/2017 0900   ALBUMIN 4.2 12/21/2016 0934   AST 16 02/05/2017 0900   ALT 14 02/05/2017 0900   ALKPHOS 56 02/05/2017 0900   BILITOT 0.8 02/05/2017 0900   BILITOT 0.2 12/21/2016 0934   BILIDIR <0.1 (L) 04/28/2015 1129   IBILI NOT CALCULATED 04/28/2015 1129      Component Value Date/Time   TSH 0.786 02/05/2017 0900   TSH 0.924 12/21/2016 0934   Vitamin D There are no recent lab results  ECG  shows NSR with a rate of 81 BPM INDIRECT CALORIMETER done today shows a VO2 of 211  and a REE of 1470. Her calculated basal metabolic rate is 2458 thus her basal metabolic rate is worse than expected.    ASSESSMENT AND PLAN: Other fatigue - Plan: EKG 12-Lead, CBC With Differential, Lipid Panel With LDL/HDL Ratio  Chronic atrial fibrillation (HCC)  Other specified hypothyroidism - Plan: T3, T4, free, TSH  Other cardiomyopathy (HCC) - nonischemic  Vitamin D deficiency - Plan: VITAMIN D 25 Hydroxy (Vit-D Deficiency, Fractures)  B12 nutritional deficiency - Plan: Vitamin B12  Hyperglycemia - Plan: Comprehensive metabolic panel, Hemoglobin A1c, Insulin, random  Chronic obstructive pulmonary disease, unspecified COPD type ([place])  Depression screening  Class 3 severe obesity with serious comorbidity and body mass index (BMI) of 40.0 to 44.9 in adult, unspecified obesity type ([place])  PLAN:  Fatigue Bridie was informed that her fatigue may be related to obesity, depression or many other causes. Labs will be ordered, and in the meanwhile Syreeta has agreed to work on diet, exercise and weight loss to help with fatigue. Proper sleep hygiene was discussed including the need for 7-8 hours of quality sleep each night. A sleep study was not ordered based on symptoms and Epworth score.  Dyspnea on exertion Janecia's shortness of breath appears to be obesity related and exercise induced. She has agreed to work on weight loss and  gradually increase exercise to treat her exercise induced shortness of breath. If Aamiyah follows our instructions and loses weight without improvement of her shortness of breath, we will plan to refer to pulmonology. We will monitor this condition regularly. Analiese agrees to this plan.  Chronic Obstructive Pulmonary Disease We will order indirect calorimetry today. Olar is strongly encouraged to stop smoking to avoid end stage COPD, MI and early death. Luanna agrees to follow up with our clinic in 3 weeks.  Atrial Fibrillation Beyla is  encouraged to lose weight to help decrease complications and for uncontrolled Afib. We will continue to monitor closely and will not give stimulant medications.  Hypothyroid Ashlee was informed of the importance of good thyroid control to help with weight loss efforts. She was also informed that supertheraputic thyroid levels are dangerous and will not improve weight loss results. We will check labs and follow.  Cardiomyopathy Nonischemic We encouraged smoking cessation along with weight loss, diet and exercise with [place] today. We will avoid any stimulant medications.  Vitamin D Deficiency Dalanie was informed that low vitamin D levels contributes to fatigue and are associated with obesity, breast, and colon cancer. We will check labs and she will follow up for routine testing of vitamin D, at least 2-3 times per year.   Vitamin B12 Deficiency Catelin will work on increasing B12 rich foods in her diet. B12 supplementation was not prescribed today. We will plan on checking labs today and Stephanee will follow up as directed.  Hyperglycemia We will check labs and Emogene agrees to follow up with our clinic in 3 weeks.  Depression Screen Karthika had a strongly positive depression screening. Depression is commonly associated with obesity and often results in emotional eating behaviors. We will monitor this closely and work on CBT to help improve the non-hunger eating patterns. Referral to Psychology may be required if no improvement is seen as she continues in our clinic.  Obesity Kyla is currently in the action stage of change and her goal is to continue with weight loss efforts She has agreed to follow the Category 2 plan +100 calories Kasyn has been instructed to work up to a goal of 150 minutes of combined cardio and strengthening exercise per week for weight loss and overall health benefits. We discussed the following Behavioral Modification Strategies today: increasing lean protein  intake, decreasing simple carbohydrates , work on meal planning and easy cooking plans and dealing with family or coworker sabotage  Brooklen has agreed to follow up with our clinic in 3 weeks. She was informed of the importance of frequent follow up visits to maximize her success with intensive lifestyle modifications for her multiple health conditions. She was informed we would discuss her lab results at her next visit unless there is a critical issue that needs to be addressed sooner. Ingra agreed to keep her next visit at the agreed upon time to discuss these results.  We spent > than 50% of the 45 minute visit on the counseling as documented in the note.    OBESITY BEHAVIORAL INTERVENTION VISIT  Today's visit was # 1 out of 22.  Starting weight: 255 lbs Starting date: 06/09/17 Today's weight : 255 lbs Today's date: 06/09/2017 Total lbs lost to date: 0 (Patients must lose 7 lbs in the first 6 months to continue with counseling)   ASK: We discussed the diagnosis of obesity with [name] today and Katalyna agreed to give [place] permission to discuss obesity behavioral modification therapy today.  ASSESS: Madaline has the diagnosis of obesity and her BMI today is 41.18 Najma is in the action stage of change   ADVISE: Freeda was educated on the multiple health risks of obesity as well as the benefit of weight loss to improve her health. She was advised of the need for long term treatment and the importance of lifestyle modifications.  AGREE: Multiple dietary modification options and treatment options were discussed and  Urijah agreed to the above obesity treatment plan.   I, Doreene Nest, am acting as transcriptionist for Dennard Nip, MD   I have reviewed the above documentation for accuracy and completeness, and I agree with the above. -Dennard Nip, MD

## 2017-06-10 LAB — CBC WITH DIFFERENTIAL
BASOS: 0 %
Basophils Absolute: 0 10*3/uL (ref 0.0–0.2)
EOS (ABSOLUTE): 0.1 10*3/uL (ref 0.0–0.4)
EOS: 1 %
HEMATOCRIT: 35.9 % (ref 34.0–46.6)
Hemoglobin: 11.6 g/dL (ref 11.1–15.9)
IMMATURE GRANS (ABS): 0 10*3/uL (ref 0.0–0.1)
Immature Granulocytes: 0 %
LYMPHS: 33 %
Lymphocytes Absolute: 1.9 10*3/uL (ref 0.7–3.1)
MCH: 27.2 pg (ref 26.6–33.0)
MCHC: 32.3 g/dL (ref 31.5–35.7)
MCV: 84 fL (ref 79–97)
MONOCYTES: 7 %
Monocytes Absolute: 0.4 10*3/uL (ref 0.1–0.9)
NEUTROS ABS: 3.3 10*3/uL (ref 1.4–7.0)
NEUTROS PCT: 59 %
RBC: 4.27 x10E6/uL (ref 3.77–5.28)
RDW: 16.1 % — ABNORMAL HIGH (ref 12.3–15.4)
WBC: 5.6 10*3/uL (ref 3.4–10.8)

## 2017-06-10 LAB — VITAMIN B12: Vitamin B-12: 338 pg/mL (ref 232–1245)

## 2017-06-10 LAB — TSH: TSH: 0.597 u[IU]/mL (ref 0.450–4.500)

## 2017-06-10 LAB — COMPREHENSIVE METABOLIC PANEL
A/G RATIO: 1.4 (ref 1.2–2.2)
ALBUMIN: 4.1 g/dL (ref 3.5–5.5)
ALT: 17 IU/L (ref 0–32)
AST: 19 IU/L (ref 0–40)
Alkaline Phosphatase: 57 IU/L (ref 39–117)
BUN / CREAT RATIO: 17 (ref 9–23)
BUN: 12 mg/dL (ref 6–24)
Bilirubin Total: 0.4 mg/dL (ref 0.0–1.2)
CALCIUM: 9 mg/dL (ref 8.7–10.2)
CO2: 22 mmol/L (ref 20–29)
Chloride: 105 mmol/L (ref 96–106)
Creatinine, Ser: 0.71 mg/dL (ref 0.57–1.00)
GFR calc Af Amer: 118 mL/min/{1.73_m2} (ref >59)
GFR, EST NON AFRICAN AMERICAN: 102 mL/min/{1.73_m2} (ref >59)
Globulin, Total: 2.9 g/dL (ref 1.5–4.5)
Glucose: 98 mg/dL (ref 65–99)
POTASSIUM: 4.4 mmol/L (ref 3.5–5.2)
Sodium: 139 mmol/L (ref 134–144)
Total Protein: 7 g/dL (ref 6.0–8.5)

## 2017-06-10 LAB — T3: T3, Total: 106 ng/dL (ref 71–180)

## 2017-06-10 LAB — LIPID PANEL WITH LDL/HDL RATIO
CHOLESTEROL TOTAL: 261 mg/dL — AB (ref 100–199)
HDL: 44 mg/dL (ref >39)
LDL CALC: 190 mg/dL — AB (ref 0–99)
LDl/HDL Ratio: 4.3 ratio — ABNORMAL HIGH (ref 0.0–3.2)
Triglycerides: 134 mg/dL (ref 0–149)
VLDL CHOLESTEROL CAL: 27 mg/dL (ref 5–40)

## 2017-06-10 LAB — VITAMIN D 25 HYDROXY (VIT D DEFICIENCY, FRACTURES): VIT D 25 HYDROXY: 19.3 ng/mL — AB (ref 30.0–100.0)

## 2017-06-10 LAB — HEMOGLOBIN A1C
Est. average glucose Bld gHb Est-mCnc: 117 mg/dL
Hgb A1c MFr Bld: 5.7 % — ABNORMAL HIGH (ref 4.8–5.6)

## 2017-06-10 LAB — INSULIN, RANDOM: INSULIN: 8 u[IU]/mL (ref 2.6–24.9)

## 2017-06-10 LAB — T4, FREE: Free T4: 1.65 ng/dL (ref 0.82–1.77)

## 2017-06-29 ENCOUNTER — Ambulatory Visit (INDEPENDENT_AMBULATORY_CARE_PROVIDER_SITE_OTHER): Payer: Medicaid Other | Admitting: Family Medicine

## 2017-06-29 VITALS — BP 110/74 | HR 79 | Temp 98.0°F | Ht 66.0 in | Wt 248.0 lb

## 2017-06-29 DIAGNOSIS — E559 Vitamin D deficiency, unspecified: Secondary | ICD-10-CM

## 2017-06-29 DIAGNOSIS — R7303 Prediabetes: Secondary | ICD-10-CM

## 2017-06-29 DIAGNOSIS — Z6841 Body Mass Index (BMI) 40.0 and over, adult: Secondary | ICD-10-CM | POA: Diagnosis not present

## 2017-06-29 MED ORDER — VITAMIN D (ERGOCALCIFEROL) 1.25 MG (50000 UNIT) PO CAPS: 50000.0000 [IU] | ORAL_CAPSULE | ORAL | 0 refills | Status: DC

## 2017-06-29 MED ORDER — METFORMIN HCL 500 MG PO TABS: 500.0000 mg | ORAL_TABLET | Freq: Every day | ORAL | 0 refills | Status: DC

## 2017-06-29 NOTE — Progress Notes (Signed)
Office: [phone]  /  Fax: [phone]   HPI:   Chief Complaint: OBESITY Alison Wilkinson is here to discuss her progress with her obesity treatment plan. She is on the Category 2 plan + 100 calories and is following her eating plan approximately 90 % of the time. She states she is exercising 0 minutes 0 times per week. Alison Wilkinson has done well with weight loss on Category 2 plan but noted increased polyphagia and often skipped breakfast, as she didn't like the options given. Her weight is 248 lb (112.5 kg) today and has had a weight loss of 7 pounds over a period of 3 weeks since her last visit. She has lost 7 lbs since starting treatment with [place].  Vitamin D Deficiency Alison Wilkinson has a new diagnosis of vitamin D deficiency. She is not on Vit D, she notes fatigue and denies nausea, vomiting or muscle weakness.  Pre-Diabetes Alison Wilkinson has a new diagnosis of pre-diabetes based on her elevated Hgb A1c and was informed this puts her at greater risk of developing diabetes. A1c elevated at 5.7, she notes polyphagia throughout the whole day. She is not taking metformin currently and continues to work on diet and exercise to decrease risk of diabetes. She denies nausea or hypoglycemia.  ALLERGIES: Allergies  Allergen Reactions  . Carvedilol Palpitations and Shortness Of Breath    Per pt report  . Lisinopril Shortness Of Breath    MEDICATIONS: Current Outpatient Medications on File Prior to Visit  Medication Sig Dispense Refill  . albuterol (PROVENTIL HFA;VENTOLIN HFA) 108 (90 Base) MCG/ACT inhaler Inhale 1-2 puffs into the lungs every 6 (six) hours as needed for wheezing or shortness of breath.    [name] albuterol (PROVENTIL) (2.5 MG/3ML) 0.083% nebulizer solution Take 3 mLs (2.5 mg total) by nebulization every 6 (six) hours as needed for wheezing or shortness of breath. 150 mL 1  . aspirin EC 81 MG tablet Take 81 mg by mouth daily.    . clonazePAM (KLONOPIN) 0.5 MG tablet Take 0.5 mg by mouth 2 (two) times  daily as needed for anxiety.    . metoprolol succinate (TOPROL-XL) 25 MG 24 hr tablet Take 25 mg by mouth daily.     [name] omeprazole (PRILOSEC) 40 MG capsule Take 1 capsule (40 mg total) by mouth daily. 90 capsule 3  . levothyroxine (SYNTHROID, LEVOTHROID) 137 MCG tablet Take 137 mcg by mouth once daily before breakfast     No current facility-administered medications on file prior to visit.     PAST MEDICAL HISTORY: Past Medical History:  Diagnosis Date  . A-fib ([place])   . Anxiety   . Atrial tachycardia ([place])   . Cancer ([place])    thyroid  . Cardiomyopathy ([place])   . COPD (chronic obstructive pulmonary disease) ([place])   . Dyspnea   . Dysrhythmia   . GERD (gastroesophageal reflux disease)   . Headache   . High cholesterol   . History of kidney stones   . Hypertension   . Hyperthyroidism   . Hypothyroid   . Mixed hyperlipidemia   . Pacemaker    biotronic  PPM   DR. [place]   . Pacemaker   . Pneumonia    hx  . PONV (postoperative nausea and vomiting)   . Shortness of breath dyspnea    WITH EXERTION   . Sleep apnea    mild case no cpap reccommended  . SSS (sick sinus syndrome) ([place])   . Stroke [facility])  2009   afib  (none in years)  . Syncope     PAST SURGICAL HISTORY: Past Surgical History:  Procedure Laterality Date  . CARDIAC CATHETERIZATION     8/16  . HYSTEROSCOPY  2015  . MULTIPLE EXTRACTIONS WITH ALVEOLOPLASTY Bilateral 08/13/2016   Procedure: MULTIPLE EXTRACTIONS;  Surgeon: Diona Browner, DDS;  Location: [place];  Service: Oral Surgery;  Laterality: Bilateral;  . PACEMAKER INSERTION  2008  . THYROIDECTOMY Bilateral 04/25/2015   Procedure: THYROIDECTOMY;  Surgeon: Melida Quitter, MD;  Location: [place];  Service: ENT;  Laterality: Bilateral;  TOTAL THYROIDECTOMY  . TUMOR REMOVAL  2013   UTERUS    SOCIAL HISTORY: Social History   Tobacco Use  . Smoking status: Current Every Day Smoker    Packs/day: 0.50    Years: 35.00    Pack years: 17.50    Types:  Cigarettes    Last attempt to quit: 12/20/2016    Years since quitting: 0.5  . Smokeless tobacco: Former Network engineer Use Topics  . Alcohol use: No    Comment: NONE IN 6 YEARS  . Drug use: No    Types: Marijuana    Comment: OVER 1 YEAR    FAMILY HISTORY: Family History  Problem Relation Age of Onset  . Diabetes Mother   . Hyperlipidemia Mother   . Hypertension Mother   . Depression Mother   . Heart disease Mother   . Bipolar disorder Mother   . Liver disease Mother   . Sleep apnea Mother   . Obesity Mother   . Hypertension Father   . Stroke Father   . Heart disease Father   . Thyroid disease Father   . Alcoholism Father   . Obesity Father   . Hyperlipidemia Sister   . Hypertension Sister   . Thyroid disease Sister   . Hypertension Daughter   . Mental illness Daughter     ROS: Review of Systems  Constitutional: Positive for malaise/fatigue and weight loss.  Gastrointestinal: Negative for nausea and vomiting.  Musculoskeletal:       Negative muscle weakness  Endo/Heme/Allergies:       Positive polyphagia Negative hypoglycemia    PHYSICAL EXAM: Blood pressure 110/74, pulse 79, temperature 98 F (36.7 C), temperature source Oral, height 5\' 6"  (1.676 m), weight 248 lb (112.5 kg), SpO2 98 %. Body mass index is 40.03 kg/m. Physical Exam  Constitutional: She is oriented to person, place, and time. She appears well-developed and well-nourished.  Cardiovascular: Normal rate.  Pulmonary/Chest: Effort normal.  Musculoskeletal: Normal range of motion.  Neurological: She is oriented to person, place, and time.  Skin: Skin is warm and dry.  Psychiatric: She has a normal mood and affect. Her behavior is normal.  Vitals reviewed.   RECENT LABS AND TESTS: BMET    Component Value Date/Time   NA 139 06/09/2017 0949   K 4.4 06/09/2017 0949   CL 105 06/09/2017 0949   CO2 22 06/09/2017 0949   GLUCOSE 98 06/09/2017 0949   GLUCOSE 109 (H) 02/05/2017 0900   BUN 12  06/09/2017 0949   CREATININE 0.71 06/09/2017 0949   CALCIUM 9.0 06/09/2017 0949   GFRNONAA 102 06/09/2017 0949   GFRAA 118 06/09/2017 0949   Lab Results  Component Value Date   HGBA1C 5.7 (H) 06/09/2017   Lab Results  Component Value Date   INSULIN 8.0 06/09/2017   CBC    Component Value Date/Time   WBC 5.6 06/09/2017 0949   WBC 6.3  02/05/2017 0900   RBC 4.27 06/09/2017 0949   RBC 4.54 02/05/2017 0900   HGB 11.6 06/09/2017 0949   HCT 35.9 06/09/2017 0949   PLT 336 02/05/2017 0900   PLT 353 12/21/2016 0934   MCV 84 06/09/2017 0949   MCH 27.2 06/09/2017 0949   MCH 28.9 02/05/2017 0900   MCHC 32.3 06/09/2017 0949   MCHC 32.6 02/05/2017 0900   RDW 16.1 (H) 06/09/2017 0949   LYMPHSABS 1.9 06/09/2017 0949   MONOABS 0.4 02/05/2017 0900   EOSABS 0.1 06/09/2017 0949   BASOSABS 0.0 06/09/2017 0949   Iron/TIBC/Ferritin/ %Sat No results found for: IRON, TIBC, FERRITIN, IRONPCTSAT Lipid Panel     Component Value Date/Time   CHOL 261 (H) 06/09/2017 0949   TRIG 134 06/09/2017 0949   HDL 44 06/09/2017 0949   CHOLHDL 5.4 (H) 12/21/2016 0934   LDLCALC 190 (H) 06/09/2017 0949   Hepatic Function Panel     Component Value Date/Time   PROT 7.0 06/09/2017 0949   ALBUMIN 4.1 06/09/2017 0949   AST 19 06/09/2017 0949   ALT 17 06/09/2017 0949   ALKPHOS 57 06/09/2017 0949   BILITOT 0.4 06/09/2017 0949   BILIDIR <0.1 (L) 04/28/2015 1129   IBILI NOT CALCULATED 04/28/2015 1129      Component Value Date/Time   TSH 0.597 06/09/2017 0949   TSH 0.786 02/05/2017 0900   TSH 0.924 12/21/2016 0934   TSH 0.005 (L) 11/11/2014 1405  Results for GIAVANNI, ZEITLIN (MRN [id number]) as of 06/29/2017 17:29  Ref. Range 06/09/2017 09:49  Vitamin D, 25-Hydroxy Latest Ref Range: 30.0 - 100.0 ng/mL 19.3 (L)    ASSESSMENT AND PLAN: Vitamin D deficiency - Plan: Vitamin D, Ergocalciferol, (DRISDOL) 50000 units CAPS capsule  Prediabetes - Plan: metFORMIN (GLUCOPHAGE) 500 MG tablet  Class 3 severe  obesity with serious comorbidity and body mass index (BMI) of 40.0 to 44.9 in adult, unspecified obesity type ([place])  PLAN:  Vitamin D Deficiency Alison Wilkinson was informed that low vitamin D levels contributes to fatigue and are associated with obesity, breast, and colon cancer. Alison Wilkinson agrees to start prescription Vit D @50 ,000 IU every week #4 with no refills. She will follow up for routine testing of vitamin D, at least 2-3 times per year. She was informed of the risk of over-replacement of vitamin D and agrees to not increase her dose unless she discusses this with [place] first. Alison Wilkinson agrees to follow up with our clinic in 2 weeks.  Pre-Diabetes Alison Wilkinson will continue to work on weight loss, exercise, and decreasing simple carbohydrates in her diet to help decrease the risk of diabetes. We dicussed metformin including benefits and risks. She was informed that eating too many simple carbohydrates or too many calories at one sitting increases the likelihood of GI side effects. Alison Wilkinson agrees to start metformin 500 mg q AM #30 with no refills. Alison Wilkinson agrees to follow up with our clinic in 2 weeks as directed to monitor her progress.  Obesity Alison Wilkinson is currently in the action stage of change. As such, her goal is to continue with weight loss efforts She has agreed to follow the Category 2 plan + 100 calories with breakfast options Alison Wilkinson has been instructed to work up to a goal of 150 minutes of combined cardio and strengthening exercise per week for weight loss and overall health benefits. We discussed the following Behavioral Modification Strategies today: increasing lean protein intake, decreasing simple carbohydrates, work on meal planning and easy cooking plans, and no skipping meals  Alison Wilkinson has agreed to follow up with our clinic in 2 weeks. She was informed of the importance of frequent follow up visits to maximize her success with intensive lifestyle modifications for her multiple health  conditions.   OBESITY BEHAVIORAL INTERVENTION VISIT  Today's visit was # 2 out of 22.  Starting weight: 255 lbs Starting date: 06/09/17 Today's weight : 248 lbs  Today's date: 06/29/2017 Total lbs lost to date: 7 (Patients must lose 7 lbs in the first 6 months to continue with counseling)   ASK: We discussed the diagnosis of obesity with [name] today and Jamese agreed to give [place] permission to discuss obesity behavioral modification therapy today.  ASSESS: Carine has the diagnosis of obesity and her BMI today is 40.05 Shamir is in the action stage of change   ADVISE: Mirely was educated on the multiple health risks of obesity as well as the benefit of weight loss to improve her health. She was advised of the need for long term treatment and the importance of lifestyle modifications.  AGREE: Multiple dietary modification options and treatment options were discussed and  Babbette agreed to the above obesity treatment plan.  I, Trixie Dredge, am acting as transcriptionist for Dennard Nip, MD  I have reviewed the above documentation for accuracy and completeness, and I agree with the above. -Dennard Nip, MD

## 2017-07-14 ENCOUNTER — Ambulatory Visit (INDEPENDENT_AMBULATORY_CARE_PROVIDER_SITE_OTHER): Payer: Medicaid Other | Admitting: Family Medicine

## 2017-07-14 VITALS — BP 107/75 | HR 67 | Temp 98.1°F | Ht 66.0 in | Wt 238.0 lb

## 2017-07-14 DIAGNOSIS — R7303 Prediabetes: Secondary | ICD-10-CM | POA: Diagnosis not present

## 2017-07-14 DIAGNOSIS — Z6838 Body mass index (BMI) 38.0-38.9, adult: Secondary | ICD-10-CM

## 2017-07-14 DIAGNOSIS — K5909 Other constipation: Secondary | ICD-10-CM

## 2017-07-14 MED ORDER — METFORMIN HCL 500 MG PO TABS: 500.0000 mg | ORAL_TABLET | Freq: Every day | ORAL | 0 refills | Status: DC

## 2017-07-14 MED ORDER — POLYETHYLENE GLYCOL 3350 17 GM/SCOOP PO POWD
17.0000 g | Freq: Two times a day (BID) | ORAL | 0 refills | Status: DC | PRN
Start: 2017-07-14 — End: 2018-04-11

## 2017-07-14 NOTE — Progress Notes (Signed)
Office: [phone]  /  Fax: [phone]   HPI:   Chief Complaint: OBESITY Alison Wilkinson is here to discuss her progress with her obesity treatment plan. She is on the Category 2 plan + 100 calories and is following her eating plan approximately 70 % of the time. She states she is exercising 0 minutes 0 times per week. Alison Wilkinson has done well with weight loss but not always eating all her food and getting bored  With her plan especially for lunch.  Her weight is 238 lb (108 kg) today and has had a weight loss of 10 pounds over a period of 2 weeks since her last visit. She has lost 17 lbs since starting treatment with [place].  Constipation Alison Wilkinson notes constipation for the last few weeks, worse since attempting weight loss. She notes decreased BM frequency, and not having any BM for 3 weeks. She denies abdominal pain or nausea. She admits to drinking less H20 recently.  Pre-Diabetes Alison Wilkinson has a diagnosis of pre-diabetes based on her elevated Hgb A1c and was informed this puts her at greater risk of developing diabetes. She started metformin and denies nausea, vomiting, or hypoglycemia. She is doing well on diet prescription and continues to work on diet and exercise to decrease risk of diabetes.  ALLERGIES: Allergies  Allergen Reactions  . Carvedilol Palpitations and Shortness Of Breath    Per pt report  . Lisinopril Shortness Of Breath  . Sulfa Antibiotics Nausea Only and Other (See Comments)    Headache     MEDICATIONS: Current Outpatient Medications on File Prior to Visit  Medication Sig Dispense Refill  . albuterol (PROVENTIL HFA;VENTOLIN HFA) 108 (90 Base) MCG/ACT inhaler Inhale 1-2 puffs into the lungs every 6 (six) hours as needed for wheezing or shortness of breath.    [name] albuterol (PROVENTIL) (2.5 MG/3ML) 0.083% nebulizer solution Take 3 mLs (2.5 mg total) by nebulization every 6 (six) hours as needed for wheezing or shortness of breath. 150 mL 1  . aspirin EC 81 MG tablet Take 81  mg by mouth daily.    . clonazePAM (KLONOPIN) 0.5 MG tablet Take 0.5 mg by mouth 2 (two) times daily as needed for anxiety.    [name] levothyroxine (SYNTHROID, LEVOTHROID) 137 MCG tablet Take 137 mcg by mouth once daily before breakfast    . metoprolol succinate (TOPROL-XL) 25 MG 24 hr tablet Take 25 mg by mouth daily.     [name] omeprazole (PRILOSEC) 40 MG capsule Take 1 capsule (40 mg total) by mouth daily. 90 capsule 3  . Vitamin D, Ergocalciferol, (DRISDOL) 50000 units CAPS capsule Take 1 capsule (50,000 Units total) by mouth every 7 (seven) days. 4 capsule 0   No current facility-administered medications on file prior to visit.     PAST MEDICAL HISTORY: Past Medical History:  Diagnosis Date  . A-fib ([place])   . Anxiety   . Atrial tachycardia ([place])   . Cancer ([place])    thyroid  . Cardiomyopathy ([place])   . COPD (chronic obstructive pulmonary disease) ([place])   . Dyspnea   . Dysrhythmia   . GERD (gastroesophageal reflux disease)   . Headache   . High cholesterol   . History of kidney stones   . Hypertension   . Hyperthyroidism   . Hypothyroid   . Mixed hyperlipidemia   . Pacemaker    biotronic  PPM   DR. [place]   . Pacemaker   . Pneumonia    hx  .  PONV (postoperative nausea and vomiting)   . Shortness of breath dyspnea    WITH EXERTION   . Sleep apnea    mild case no cpap reccommended  . SSS (sick sinus syndrome) ([place])   . Stroke [facility])    2009   afib  (none in years)  . Syncope     PAST SURGICAL HISTORY: Past Surgical History:  Procedure Laterality Date  . CARDIAC CATHETERIZATION     8/16  . HYSTEROSCOPY  2015  . MULTIPLE EXTRACTIONS WITH ALVEOLOPLASTY Bilateral 08/13/2016   Procedure: MULTIPLE EXTRACTIONS;  Surgeon: Diona Browner, DDS;  Location: [place];  Service: Oral Surgery;  Laterality: Bilateral;  . PACEMAKER INSERTION  2008  . THYROIDECTOMY Bilateral 04/25/2015   Procedure: THYROIDECTOMY;  Surgeon: Melida Quitter, MD;  Location: [place];  Service: ENT;   Laterality: Bilateral;  TOTAL THYROIDECTOMY  . TUMOR REMOVAL  2013   UTERUS    SOCIAL HISTORY: Social History   Tobacco Use  . Smoking status: Current Every Day Smoker    Packs/day: 0.50    Years: 35.00    Pack years: 17.50    Types: Cigarettes    Last attempt to quit: 12/20/2016    Years since quitting: 0.5  . Smokeless tobacco: Former Network engineer Use Topics  . Alcohol use: No    Comment: NONE IN 6 YEARS  . Drug use: No    Types: Marijuana    Comment: OVER 1 YEAR    FAMILY HISTORY: Family History  Problem Relation Age of Onset  . Diabetes Mother   . Hyperlipidemia Mother   . Hypertension Mother   . Depression Mother   . Heart disease Mother   . Bipolar disorder Mother   . Liver disease Mother   . Sleep apnea Mother   . Obesity Mother   . Hypertension Father   . Stroke Father   . Heart disease Father   . Thyroid disease Father   . Alcoholism Father   . Obesity Father   . Hyperlipidemia Sister   . Hypertension Sister   . Thyroid disease Sister   . Hypertension Daughter   . Mental illness Daughter     ROS: Review of Systems  Constitutional: Positive for weight loss.  Gastrointestinal: Positive for constipation. Negative for nausea and vomiting.  Endo/Heme/Allergies:       Negative hypoglycemia    PHYSICAL EXAM: Blood pressure 107/75, pulse 67, temperature 98.1 F (36.7 C), temperature source Oral, height 5\' 6"  (1.676 m), weight 238 lb (108 kg), last menstrual period 06/30/2017, SpO2 93 %. Body mass index is 38.41 kg/m. Physical Exam  Constitutional: She is oriented to person, place, and time. She appears well-developed and well-nourished.  Cardiovascular: Normal rate.  Pulmonary/Chest: Effort normal.  Musculoskeletal: Normal range of motion.  Neurological: She is oriented to person, place, and time.  Skin: Skin is warm and dry.  Psychiatric: She has a normal mood and affect. Her behavior is normal.  Vitals reviewed.   RECENT LABS AND  TESTS: BMET    Component Value Date/Time   NA 139 06/09/2017 0949   K 4.4 06/09/2017 0949   CL 105 06/09/2017 0949   CO2 22 06/09/2017 0949   GLUCOSE 98 06/09/2017 0949   GLUCOSE 109 (H) 02/05/2017 0900   BUN 12 06/09/2017 0949   CREATININE 0.71 06/09/2017 0949   CALCIUM 9.0 06/09/2017 0949   GFRNONAA 102 06/09/2017 0949   GFRAA 118 06/09/2017 0949   Lab Results  Component Value Date  HGBA1C 5.7 (H) 06/09/2017   Lab Results  Component Value Date   INSULIN 8.0 06/09/2017   CBC    Component Value Date/Time   WBC 5.6 06/09/2017 0949   WBC 6.3 02/05/2017 0900   RBC 4.27 06/09/2017 0949   RBC 4.54 02/05/2017 0900   HGB 11.6 06/09/2017 0949   HCT 35.9 06/09/2017 0949   PLT 336 02/05/2017 0900   PLT 353 12/21/2016 0934   MCV 84 06/09/2017 0949   MCH 27.2 06/09/2017 0949   MCH 28.9 02/05/2017 0900   MCHC 32.3 06/09/2017 0949   MCHC 32.6 02/05/2017 0900   RDW 16.1 (H) 06/09/2017 0949   LYMPHSABS 1.9 06/09/2017 0949   MONOABS 0.4 02/05/2017 0900   EOSABS 0.1 06/09/2017 0949   BASOSABS 0.0 06/09/2017 0949   Iron/TIBC/Ferritin/ %Sat No results found for: IRON, TIBC, FERRITIN, IRONPCTSAT Lipid Panel     Component Value Date/Time   CHOL 261 (H) 06/09/2017 0949   TRIG 134 06/09/2017 0949   HDL 44 06/09/2017 0949   CHOLHDL 5.4 (H) 12/21/2016 0934   LDLCALC 190 (H) 06/09/2017 0949   Hepatic Function Panel     Component Value Date/Time   PROT 7.0 06/09/2017 0949   ALBUMIN 4.1 06/09/2017 0949   AST 19 06/09/2017 0949   ALT 17 06/09/2017 0949   ALKPHOS 57 06/09/2017 0949   BILITOT 0.4 06/09/2017 0949   BILIDIR <0.1 (L) 04/28/2015 1129   IBILI NOT CALCULATED 04/28/2015 1129      Component Value Date/Time   TSH 0.597 06/09/2017 0949   TSH 0.786 02/05/2017 0900   TSH 0.924 12/21/2016 0934   TSH 0.005 (L) 11/11/2014 1405    ASSESSMENT AND PLAN: Other constipation - Plan: polyethylene glycol powder (GLYCOLAX/MIRALAX) powder  Prediabetes - Plan: metFORMIN  (GLUCOPHAGE) 500 MG tablet  Class 2 severe obesity with serious comorbidity and body mass index (BMI) of 38.0 to 38.9 in adult, unspecified obesity type ([place])  PLAN:  Constipation Alison Wilkinson was informed decrease bowel movement frequency is normal while losing weight, but stools should not be hard or painful. She was advised to increase her H20 intake and work on increasing her fiber intake. High fiber foods were discussed today. Alison Wilkinson agrees to start miralax 17 g q daily and we will refill for 1 month. Alison Wilkinson agrees to follow up with our clinic in 2 to 3 weeks.  Pre-Diabetes Alison Wilkinson will continue to work on weight loss, exercise, and decreasing simple carbohydrates in her diet to help decrease the risk of diabetes. We dicussed metformin including benefits and risks. She was informed that eating too many simple carbohydrates or too many calories at one sitting increases the likelihood of GI side effects. Alison Wilkinson agrees to continue taking metformin 500 mg q AM #30 and we will refill for 1 month. Alison Wilkinson agrees to follow up with our clinic in 2 to 3 weeks as directed to monitor her progress.  Obesity Alison Wilkinson is currently in the action stage of change. As such, her goal is to continue with weight loss efforts She has agreed to follow the Category 2 plan Alison Wilkinson has been instructed to work up to a goal of 150 minutes of combined cardio and strengthening exercise per week for weight loss and overall health benefits. We discussed the following Behavioral Modification Strategies today: increasing fiber rich foods, increase H20 intake, and no skipping meals   Alison Wilkinson has agreed to follow up with our clinic in 2 to 3 weeks. She was informed of the importance of frequent  follow up visits to maximize her success with intensive lifestyle modifications for her multiple health conditions.   OBESITY BEHAVIORAL INTERVENTION VISIT  Today's visit was # 3 out of 22.  Starting weight: 255 lbs Starting date:  06/09/17 Today's weight : 238 lbs Today's date: 07/14/2017 Total lbs lost to date: 17 (Patients must lose 7 lbs in the first 6 months to continue with counseling)   ASK: We discussed the diagnosis of obesity with Alison Wilkinson today and Alison Wilkinson agreed to give [place] permission to discuss obesity behavioral modification therapy today.  ASSESS: Alison Wilkinson has the diagnosis of obesity and her BMI today is 38.43 Alison Wilkinson is in the action stage of change   ADVISE: Alison Wilkinson was educated on the multiple health risks of obesity as well as the benefit of weight loss to improve her health. She was advised of the need for long term treatment and the importance of lifestyle modifications.  AGREE: Multiple dietary modification options and treatment options were discussed and  Alison Wilkinson agreed to the above obesity treatment plan.  I, Trixie Dredge, am acting as transcriptionist for Dennard Nip, MD  I have reviewed the above documentation for accuracy and completeness, and I agree with the above. -Dennard Nip, MD

## 2017-07-28 ENCOUNTER — Other Ambulatory Visit (INDEPENDENT_AMBULATORY_CARE_PROVIDER_SITE_OTHER): Payer: Self-pay | Admitting: Family Medicine

## 2017-07-28 DIAGNOSIS — E559 Vitamin D deficiency, unspecified: Secondary | ICD-10-CM

## 2017-08-01 ENCOUNTER — Ambulatory Visit: Payer: Medicaid Other | Admitting: Family Medicine

## 2017-08-01 ENCOUNTER — Encounter: Payer: Self-pay | Admitting: Family Medicine

## 2017-08-01 VITALS — BP 126/90 | HR 76 | Temp 97.2°F | Ht 66.0 in | Wt 240.2 lb

## 2017-08-01 DIAGNOSIS — M5412 Radiculopathy, cervical region: Secondary | ICD-10-CM | POA: Diagnosis not present

## 2017-08-01 MED ORDER — CYCLOBENZAPRINE HCL 10 MG PO TABS: 10.0000 mg | ORAL_TABLET | Freq: Three times a day (TID) | ORAL | 0 refills | Status: DC | PRN

## 2017-08-01 MED ORDER — OXYCODONE-ACETAMINOPHEN 5-325 MG PO TABS: 0.5000 | ORAL_TABLET | Freq: Four times a day (QID) | ORAL | 0 refills | Status: DC | PRN

## 2017-08-01 MED ORDER — PREDNISONE 20 MG PO TABS: 40.0000 mg | ORAL_TABLET | Freq: Every day | ORAL | 0 refills | Status: DC

## 2017-08-01 NOTE — Patient Instructions (Signed)
Great to see you!  Start prednisone 2 pills once a day for 5 days, use Flexeril at night and when you are okay with being drowsy.  Percocet is a narcotic, do not drive after you take it.  Use this for severe pain.  Seek emergency medical care if you develop unexplained rash, fever, severe pain, or weakness or numbness of your hands, or difficulty walking.

## 2017-08-01 NOTE — Progress Notes (Signed)
   HPI  Patient presents today here with neck pain.  Patient explains it started last night, is worse this morning.  She describes midline and bilateral posterior neck pain.  Hurts worse with moving her neck.  She recently had a abscess behind her left ear which has completely resolved.  Patient states that she did have good resolution of that problem.  She states that she has a little tingling of the fourth and fifth fingers of her right hand as well as the second and third to a lesser extent. Preserved strength of the hands. No injury.  PMH: Smoking status noted ROS: Per HPI  Objective: BP 126/90   Pulse 76   Temp (!) 97.2 F (36.2 C) (Oral)   Ht 5\' 6"  (1.676 m)   Wt 240 lb 3.2 oz (109 kg)   BMI 38.77 kg/m  Gen: NAD, alert, cooperative with exam HEENT: NCAT CV: RRR, good S1/S2 Resp: CTABL, no wheezes, non-labored Ext: No edema, warm Neuro: Alert and oriented, strength 5/5 and sensation intact in bilateral upper extremities strength is symmetric MSK:  Limited range of motion of neck to approximately 30 degrees with lateral rotation as well as flexion and extension Tenderness to palpation of midline spine and paraspinal muscles in the cervical area   Assessment and plan:  #Cervical radiculopathy Patient with moderate to severe neck pain with radiation to the right fourth and fifth digits. Preserved strength No red flags Prednisone, Flexeril, Percocet for severe pain. Avoiding NSAIDs given history of stroke Discussed red flags for meningitis, she does not have any fever or illness currently  Meds ordered this encounter  Medications  . predniSONE (DELTASONE) 20 MG tablet    Sig: Take 2 tablets (40 mg total) by mouth daily with breakfast.    Dispense:  10 tablet    Refill:  0  . oxyCODONE-acetaminophen (PERCOCET) 5-325 MG tablet    Sig: Take 0.5-1 tablets by mouth every 6 (six) hours as needed for severe pain.    Dispense:  12 tablet    Refill:  0  .  cyclobenzaprine (FLEXERIL) 10 MG tablet    Sig: Take 1 tablet (10 mg total) by mouth 3 (three) times daily as needed for muscle spasms.    Dispense:  30 tablet    Refill:  0    Laroy Apple, MD [facility] 08/01/2017, 9:03 AM

## 2017-08-04 ENCOUNTER — Ambulatory Visit (INDEPENDENT_AMBULATORY_CARE_PROVIDER_SITE_OTHER): Payer: Medicaid Other | Admitting: Family Medicine

## 2017-08-15 ENCOUNTER — Ambulatory Visit (INDEPENDENT_AMBULATORY_CARE_PROVIDER_SITE_OTHER): Payer: Medicaid Other | Admitting: Physician Assistant

## 2017-08-15 ENCOUNTER — Encounter (INDEPENDENT_AMBULATORY_CARE_PROVIDER_SITE_OTHER): Payer: Self-pay

## 2017-10-04 DIAGNOSIS — C73 Malignant neoplasm of thyroid gland: Secondary | ICD-10-CM | POA: Diagnosis not present

## 2017-10-04 DIAGNOSIS — R499 Unspecified voice and resonance disorder: Secondary | ICD-10-CM | POA: Diagnosis not present

## 2017-10-04 DIAGNOSIS — L680 Hirsutism: Secondary | ICD-10-CM | POA: Diagnosis not present

## 2017-10-04 DIAGNOSIS — E890 Postprocedural hypothyroidism: Secondary | ICD-10-CM | POA: Diagnosis not present

## 2017-10-31 DIAGNOSIS — I495 Sick sinus syndrome: Secondary | ICD-10-CM | POA: Diagnosis not present

## 2017-11-18 DIAGNOSIS — R7303 Prediabetes: Secondary | ICD-10-CM | POA: Diagnosis not present

## 2017-11-18 DIAGNOSIS — R9439 Abnormal result of other cardiovascular function study: Secondary | ICD-10-CM | POA: Diagnosis not present

## 2017-12-30 DIAGNOSIS — I110 Hypertensive heart disease with heart failure: Secondary | ICD-10-CM | POA: Diagnosis not present

## 2017-12-30 DIAGNOSIS — R7303 Prediabetes: Secondary | ICD-10-CM | POA: Diagnosis not present

## 2017-12-30 DIAGNOSIS — Z79899 Other long term (current) drug therapy: Secondary | ICD-10-CM | POA: Diagnosis not present

## 2017-12-30 DIAGNOSIS — F329 Major depressive disorder, single episode, unspecified: Secondary | ICD-10-CM | POA: Diagnosis not present

## 2017-12-30 DIAGNOSIS — K219 Gastro-esophageal reflux disease without esophagitis: Secondary | ICD-10-CM | POA: Diagnosis not present

## 2017-12-30 DIAGNOSIS — Z7982 Long term (current) use of aspirin: Secondary | ICD-10-CM | POA: Diagnosis not present

## 2017-12-30 DIAGNOSIS — Z8673 Personal history of transient ischemic attack (TIA), and cerebral infarction without residual deficits: Secondary | ICD-10-CM | POA: Diagnosis not present

## 2017-12-30 DIAGNOSIS — T465X1A Poisoning by other antihypertensive drugs, accidental (unintentional), initial encounter: Secondary | ICD-10-CM | POA: Diagnosis not present

## 2017-12-30 DIAGNOSIS — I509 Heart failure, unspecified: Secondary | ICD-10-CM | POA: Diagnosis not present

## 2017-12-30 DIAGNOSIS — Z888 Allergy status to other drugs, medicaments and biological substances status: Secondary | ICD-10-CM | POA: Diagnosis not present

## 2017-12-30 DIAGNOSIS — I4891 Unspecified atrial fibrillation: Secondary | ICD-10-CM | POA: Diagnosis not present

## 2017-12-30 DIAGNOSIS — E785 Hyperlipidemia, unspecified: Secondary | ICD-10-CM | POA: Diagnosis not present

## 2017-12-30 DIAGNOSIS — J449 Chronic obstructive pulmonary disease, unspecified: Secondary | ICD-10-CM | POA: Diagnosis not present

## 2017-12-30 DIAGNOSIS — Z950 Presence of cardiac pacemaker: Secondary | ICD-10-CM | POA: Diagnosis not present

## 2017-12-30 DIAGNOSIS — I495 Sick sinus syndrome: Secondary | ICD-10-CM | POA: Diagnosis not present

## 2017-12-30 DIAGNOSIS — T43631A Poisoning by methylphenidate, accidental (unintentional), initial encounter: Secondary | ICD-10-CM | POA: Diagnosis not present

## 2017-12-30 DIAGNOSIS — Z882 Allergy status to sulfonamides status: Secondary | ICD-10-CM | POA: Diagnosis not present

## 2017-12-30 DIAGNOSIS — F419 Anxiety disorder, unspecified: Secondary | ICD-10-CM | POA: Diagnosis not present

## 2017-12-30 DIAGNOSIS — F17210 Nicotine dependence, cigarettes, uncomplicated: Secondary | ICD-10-CM | POA: Diagnosis not present

## 2018-01-01 DIAGNOSIS — W57XXXA Bitten or stung by nonvenomous insect and other nonvenomous arthropods, initial encounter: Secondary | ICD-10-CM | POA: Diagnosis not present

## 2018-01-01 DIAGNOSIS — S70361A Insect bite (nonvenomous), right thigh, initial encounter: Secondary | ICD-10-CM | POA: Diagnosis not present

## 2018-01-01 DIAGNOSIS — L03115 Cellulitis of right lower limb: Secondary | ICD-10-CM | POA: Diagnosis not present

## 2018-01-06 DIAGNOSIS — H40033 Anatomical narrow angle, bilateral: Secondary | ICD-10-CM | POA: Diagnosis not present

## 2018-01-06 DIAGNOSIS — Z23 Encounter for immunization: Secondary | ICD-10-CM | POA: Diagnosis not present

## 2018-01-06 DIAGNOSIS — H2513 Age-related nuclear cataract, bilateral: Secondary | ICD-10-CM | POA: Diagnosis not present

## 2018-01-07 DIAGNOSIS — H5213 Myopia, bilateral: Secondary | ICD-10-CM | POA: Diagnosis not present

## 2018-01-08 IMAGING — CT CT ABDOMEN W/ CM
2 of 5 series · 17 of 46 positions shown, 19 images · IV contrast (Omnipaque 300)
Comparison: Ultrasound 04/16/2015

CLINICAL DATA: RIGHT upper quadrant pain for 5 months. Increasing
severity over last several nights.

EXAM:
CT ABDOMEN WITH CONTRAST
TECHNIQUE: Multidetector CT imaging of the abdomen was performed using the
standard protocol following bolus administration of intravenous
contrast.
CONTRAST:  100mL OMNIPAQUE IOHEXOL 300 MG/ML  SOLN

[Series 2: abd_with 5.0 b40f · axial · 0.81mm/px · z∈[-306,-26]mm · 14 of 64 slices shown, 16 images]
[im 4/64  soft-tissue]
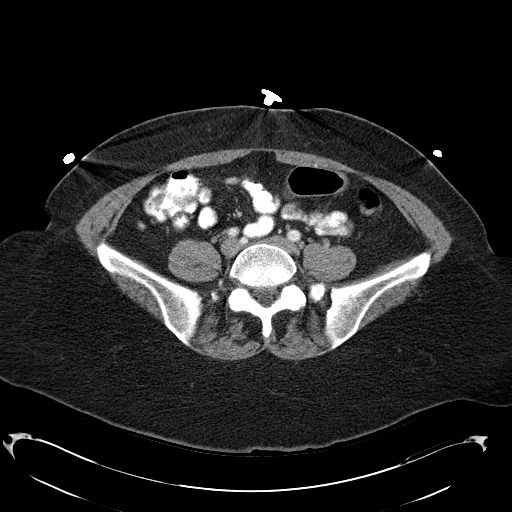
[im 4/64  bone]
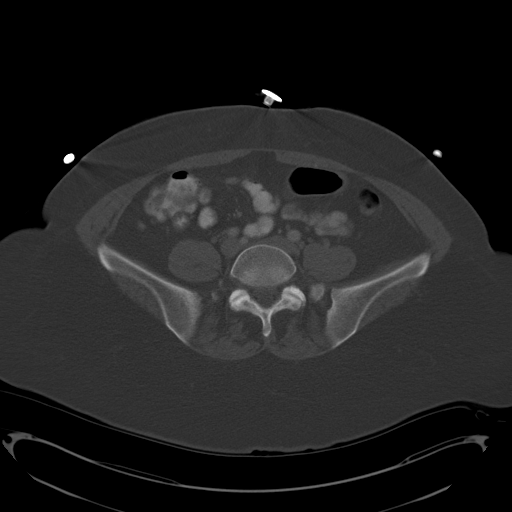
[im 8/64  soft-tissue]
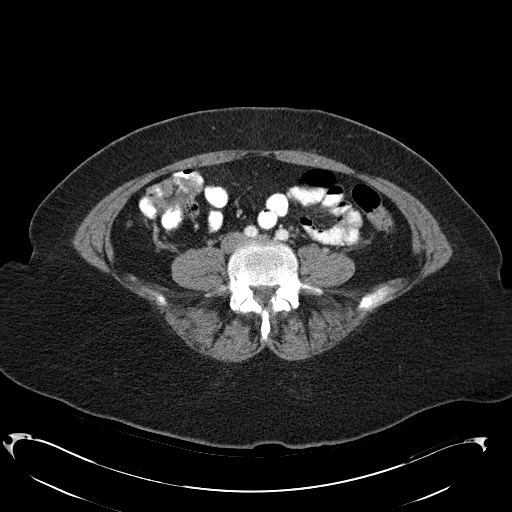
[im 12/64  soft-tissue]
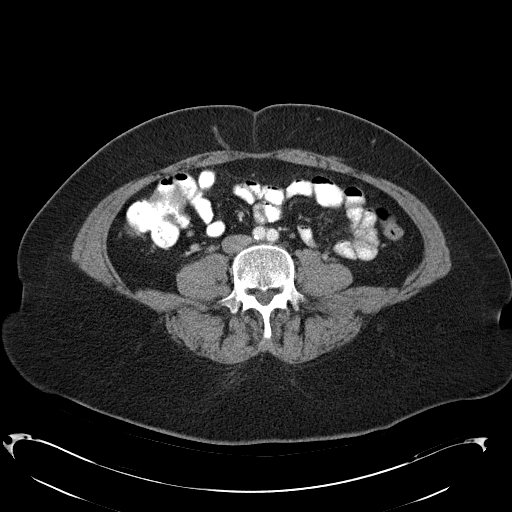
[im 16/64  soft-tissue]
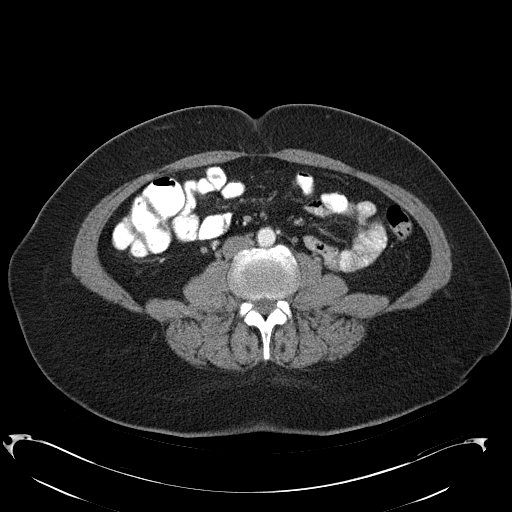
[im 20/64  soft-tissue]
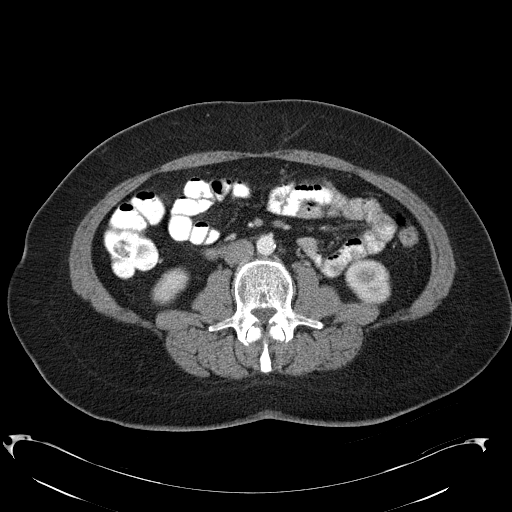
[im 24/64  soft-tissue]
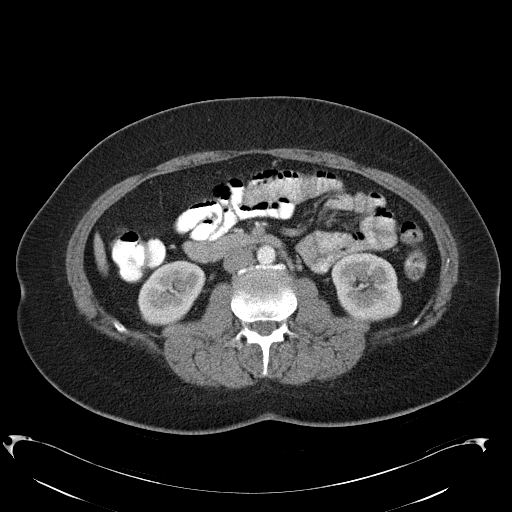
[im 28/64  soft-tissue]
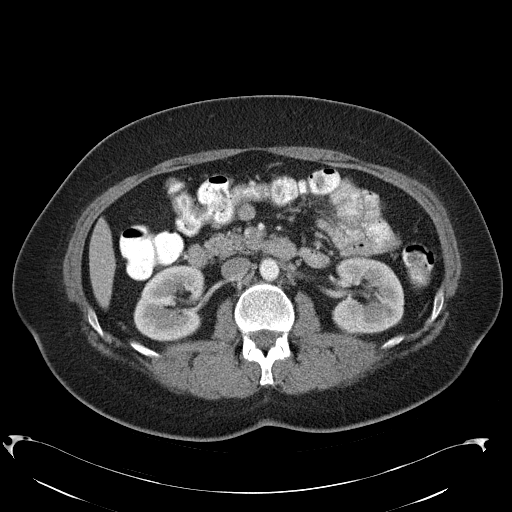
[im 36/64  soft-tissue]
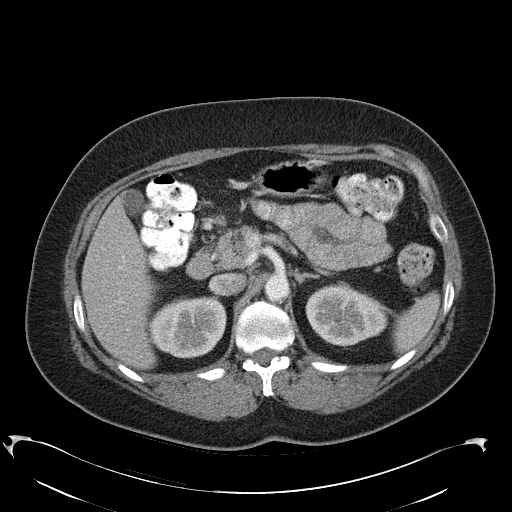
[im 40/64  soft-tissue]
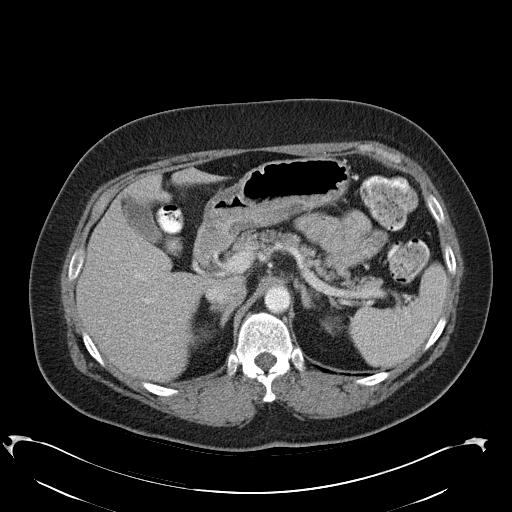
[im 40/64  bone]
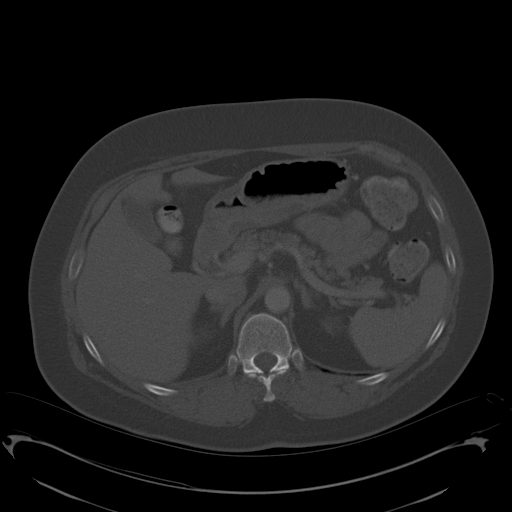
[im 44/64  soft-tissue]
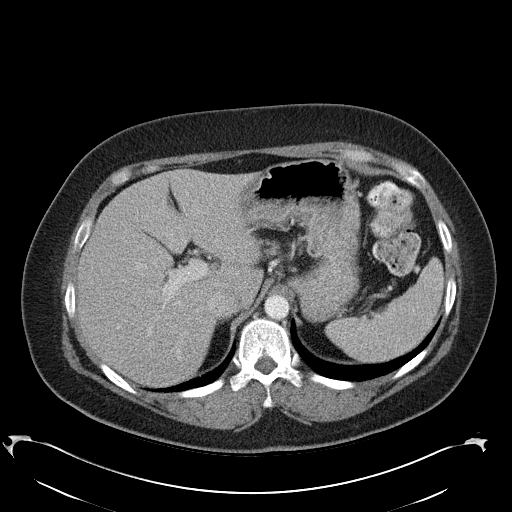
[im 48/64  soft-tissue]
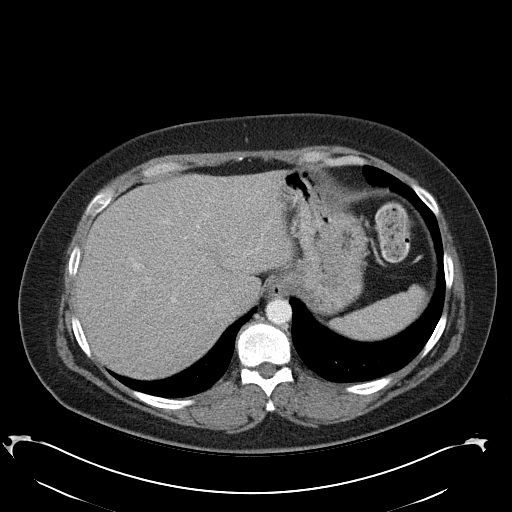
[im 52/64  soft-tissue]
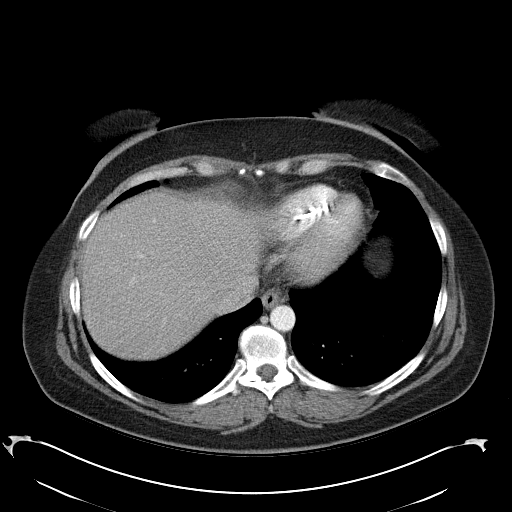
[im 56/64  soft-tissue]
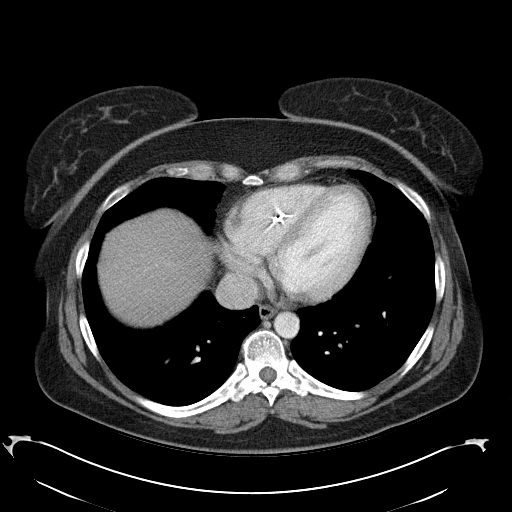
[im 60/64  soft-tissue]
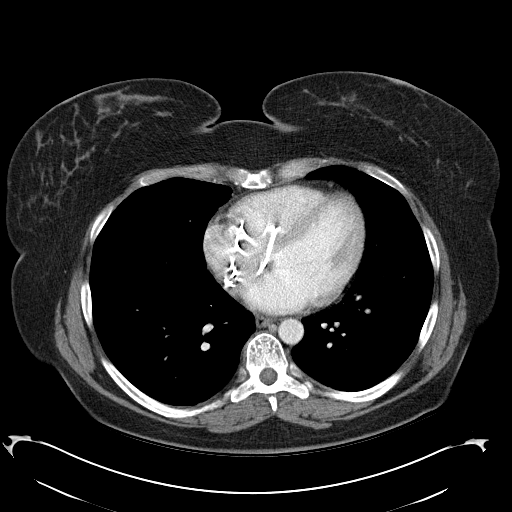

[Series 4: abd_with 3.0 spo cor cor · coronal · 0.66mm/px · 3 of 92 slices shown]
[im 31/92  soft-tissue]
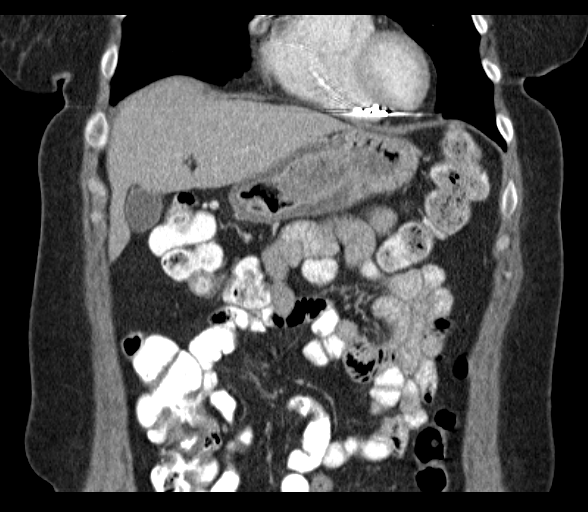
[im 41/92  soft-tissue]
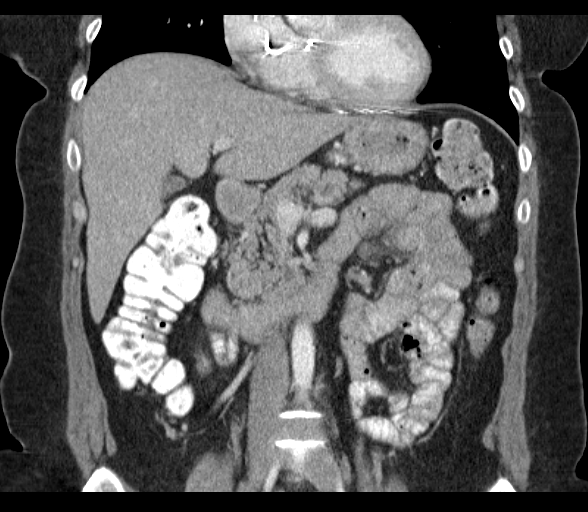
[im 51/92  soft-tissue]
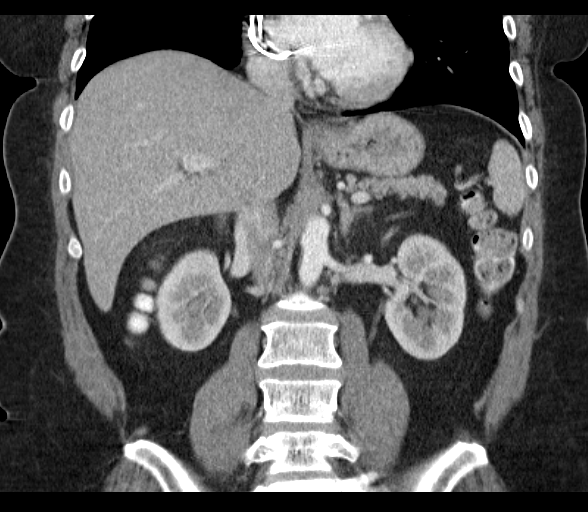

[17 of 46 positions shown; findings below may reference images not displayed]

FINDINGS: Lower chest: Lung bases are clear. Pacemaker leads in the RIGHT
heart

Hepatobiliary: No focal hepatic lesion. No biliary duct dilatation.
Gallbladder is normal. Common bile duct is normal.

Pancreas: Pancreas is normal. No ductal dilatation. No pancreatic
inflammation.

Spleen: Normal spleen

Adrenals/urinary tract: Adrenal glands kidneys and proximal ureters
are normal.

Stomach/Bowel: Stomach, duodenum, limited view of the bowel is
unremarkable. Appendix is partially imaged and appears normal.

Vascular/Lymphatic: Abdominal aorta is normal caliber. There is no
retroperitoneal or periportal lymphadenopathy. No pelvic
lymphadenopathy.

Other: No free fluid.

Musculoskeletal: No aggressive osseous lesion.
IMPRESSION: 1. No acute abdominal findings.
2. Normal gallbladder and pancreas.

## 2018-01-24 DIAGNOSIS — H1013 Acute atopic conjunctivitis, bilateral: Secondary | ICD-10-CM | POA: Diagnosis not present

## 2018-01-27 DIAGNOSIS — R7989 Other specified abnormal findings of blood chemistry: Secondary | ICD-10-CM | POA: Diagnosis not present

## 2018-02-22 DIAGNOSIS — K429 Umbilical hernia without obstruction or gangrene: Secondary | ICD-10-CM | POA: Diagnosis not present

## 2018-02-22 DIAGNOSIS — R1031 Right lower quadrant pain: Secondary | ICD-10-CM | POA: Diagnosis not present

## 2018-03-03 DIAGNOSIS — I495 Sick sinus syndrome: Secondary | ICD-10-CM | POA: Diagnosis not present

## 2018-03-03 DIAGNOSIS — C73 Malignant neoplasm of thyroid gland: Secondary | ICD-10-CM | POA: Diagnosis not present

## 2018-03-03 DIAGNOSIS — R7303 Prediabetes: Secondary | ICD-10-CM | POA: Diagnosis not present

## 2018-03-12 DIAGNOSIS — R070 Pain in throat: Secondary | ICD-10-CM | POA: Diagnosis not present

## 2018-03-19 DIAGNOSIS — R05 Cough: Secondary | ICD-10-CM | POA: Diagnosis not present

## 2018-03-19 DIAGNOSIS — J029 Acute pharyngitis, unspecified: Secondary | ICD-10-CM | POA: Diagnosis not present

## 2018-03-24 DIAGNOSIS — I495 Sick sinus syndrome: Secondary | ICD-10-CM | POA: Diagnosis not present

## 2018-04-11 ENCOUNTER — Ambulatory Visit (INDEPENDENT_AMBULATORY_CARE_PROVIDER_SITE_OTHER): Payer: Medicaid Other | Admitting: Family Medicine

## 2018-04-11 ENCOUNTER — Encounter: Payer: Self-pay | Admitting: Family Medicine

## 2018-04-11 VITALS — BP 146/90 | HR 78 | Temp 98.4°F | Ht 66.0 in | Wt 243.0 lb

## 2018-04-11 DIAGNOSIS — J060 Acute laryngopharyngitis: Secondary | ICD-10-CM | POA: Diagnosis not present

## 2018-04-11 DIAGNOSIS — J029 Acute pharyngitis, unspecified: Secondary | ICD-10-CM

## 2018-04-11 DIAGNOSIS — K219 Gastro-esophageal reflux disease without esophagitis: Secondary | ICD-10-CM

## 2018-04-11 LAB — RAPID STREP SCREEN (MED CTR MEBANE ONLY): STREP GP A AG, IA W/REFLEX: NEGATIVE

## 2018-04-11 LAB — CULTURE, GROUP A STREP

## 2018-04-11 MED ORDER — OMEPRAZOLE 40 MG PO CPDR: 40.0000 mg | DELAYED_RELEASE_CAPSULE | Freq: Every day | ORAL | 3 refills | Status: DC

## 2018-04-11 NOTE — Patient Instructions (Addendum)
Pharyngitis  Pharyngitis is a sore throat (pharynx). This is when there is redness, pain, and swelling in your throat. Most of the time, this condition gets better on its own. In some cases, you may need medicine. Follow these instructions at home:  Take over-the-counter and prescription medicines only as told by your doctor. ? If you were prescribed an antibiotic medicine, take it as told by your doctor. Do not stop taking the antibiotic even if you start to feel better. ? Do not give children aspirin. Aspirin has been linked to Reye syndrome.  Drink enough water and fluids to keep your pee (urine) clear or pale yellow.  Get a lot of rest.  Rinse your mouth (gargle) with a salt-water mixture 3-4 times a day or as needed. To make a salt-water mixture, completely dissolve -1 tsp of salt in 1 cup of warm water.  If your doctor approves, you may use throat lozenges or sprays to soothe your throat. Contact a doctor if:  You have large, tender lumps in your neck.  You have a rash.  You cough up green, yellow-brown, or bloody spit. Get help right away if:  You have a stiff neck.  You drool or cannot swallow liquids.  You cannot drink or take medicines without throwing up.  You have very bad pain that does not go away with medicine.  You have problems breathing, and it is not from a stuffy nose.  You have new pain and swelling in your knees, ankles, wrists, or elbows. Summary  Pharyngitis is a sore throat (pharynx). This is when there is redness, pain, and swelling in your throat.  If you were prescribed an antibiotic medicine, take it as told by your doctor. Do not stop taking the antibiotic even if you start to feel better.  Most of the time, pharyngitis gets better on its own. Sometimes, you may need medicine. This information is not intended to replace advice given to you by your health care provider. Make sure you discuss any questions you have with your health care  provider. Document Released: 09/01/2007 Document Revised: 04/20/2016 Document Reviewed: 04/20/2016 Elsevier Interactive Patient Education  2019 Elsevier Inc.  

## 2018-04-11 NOTE — Progress Notes (Signed)
Alison Wilkinson is a 48 y.o. female presenting with a sore throat for 35 days.  Associated symptoms include:  chills.  Symptoms are constant. She has been seen at Urgent Care over the last 35 days and has completed two rounds of amoxicillin. States she has had chronic pharyngitis and frequent strep infections. States she has talked about having her tonsils removed in the past if this continued. She states she has a sore throat every month. She denies fever. States she would like a referral to ENT at this time due to the recurrence of the pharyngitis.   Home treatment thus far includes:  rest, hydration, NSAIDS/acetaminophen, lozenges, OTC sore throat / cold products, prescription meds amoxicillin and salt water gargles.  No known sick contacts with similar symptoms.  There is a previous history of of similar symptoms.  Pt states she also needs a refill on her GERD medications. States she has been taking this on a regular basis. Denies adverse side effects. States her GERD is well controlled on the medications.   Previous medical history, allergies, medications, family history, and social history reviewed.   Exam:  BP (!) 146/90   Pulse 78   Temp 98.4 F (36.9 C) (Oral)   Ht 5\' 6"  (1.676 m)   Wt 243 lb (110.2 kg)   BMI 39.22 kg/m  Physical Exam  Constitutional: She is oriented to person, place, and time. She appears well-developed and well-nourished. She is cooperative. She appears distressed (mild).  HENT:  Head: Normocephalic and atraumatic.  Right Ear: Hearing, tympanic membrane, external ear and ear canal normal.  Left Ear: Hearing, tympanic membrane, external ear and ear canal normal.  Nose: Nose normal.  Mouth/Throat: Uvula is midline and mucous membranes are normal. Posterior oropharyngeal edema and posterior oropharyngeal erythema present. No oropharyngeal exudate or tonsillar abscesses.  Eyes: Pupils are equal, round, and reactive to light. Conjunctivae and lids are  normal.  Neck: Trachea normal. Neck supple.  Cardiovascular: Normal rate, regular rhythm and normal heart sounds. Exam reveals no gallop and no friction rub.  No murmur heard. Abdominal: Soft. Bowel sounds are normal. She exhibits no distension. There is no hepatosplenomegaly. There is no abdominal tenderness. There is no CVA tenderness.  Lymphadenopathy:    She has no cervical adenopathy.  Neurological: She is alert and oriented to person, place, and time.  Skin: Skin is warm and dry.  Psychiatric: She has a normal mood and affect. Her speech is normal and behavior is normal. Judgment and thought content normal. Cognition and memory are normal.   Negative strep in office.   Alison Wilkinson was seen today for sore throat.  Diagnoses and all orders for this visit:  Sore throat Negative strep in office. Due to recurrence of symptoms and severity of pharyngitis today, will refer to ENT and rule out Mono. Continue symptomatic care. Report any new or worsening symptoms.  -     Rapid Strep Screen ([facility] ONLY) -     Ambulatory referral to ENT -     Epstein-Barr virus VCA antibody panel  Sore throat and laryngitis Negative strep in office. Due to recurrence of symptoms and severity of pharyngitis today, will refer to ENT and rule out Mono. Continue symptomatic care. Report any new or worsening symptoms.  -     Ambulatory referral to ENT -     Epstein-Barr virus VCA antibody panel  Gastroesophageal reflux disease without esophagitis Avoid spicy foods, cigarette smoke, and caffeine. Avoid other triggers.  Medications as prescribed. Report any new or worsening symptoms.  -     omeprazole (PRILOSEC) 40 MG capsule; Take 1 capsule (40 mg total) by mouth daily.

## 2018-04-12 LAB — EPSTEIN-BARR VIRUS VCA ANTIBODY PANEL
EBV EARLY ANTIGEN AB, IGG: 17.3 U/mL — AB (ref 0.0–8.9)
EBV NA IgG: 152 U/mL — ABNORMAL HIGH (ref 0.0–17.9)
EBV VCA IgG: >600 U/mL — ABNORMAL HIGH (ref 0.0–17.9)

## 2018-04-27 DIAGNOSIS — J029 Acute pharyngitis, unspecified: Secondary | ICD-10-CM | POA: Diagnosis not present

## 2018-04-27 DIAGNOSIS — J3501 Chronic tonsillitis: Secondary | ICD-10-CM | POA: Diagnosis not present

## 2018-05-16 DIAGNOSIS — I495 Sick sinus syndrome: Secondary | ICD-10-CM | POA: Diagnosis not present

## 2018-05-16 DIAGNOSIS — Z950 Presence of cardiac pacemaker: Secondary | ICD-10-CM | POA: Diagnosis not present

## 2018-05-17 DIAGNOSIS — H1013 Acute atopic conjunctivitis, bilateral: Secondary | ICD-10-CM | POA: Diagnosis not present

## 2018-05-26 DIAGNOSIS — I428 Other cardiomyopathies: Secondary | ICD-10-CM | POA: Diagnosis not present

## 2018-05-26 DIAGNOSIS — R0602 Shortness of breath: Secondary | ICD-10-CM | POA: Diagnosis not present

## 2018-08-22 DIAGNOSIS — I495 Sick sinus syndrome: Secondary | ICD-10-CM | POA: Diagnosis not present

## 2018-09-18 DIAGNOSIS — R197 Diarrhea, unspecified: Secondary | ICD-10-CM | POA: Diagnosis not present

## 2018-09-21 DIAGNOSIS — K219 Gastro-esophageal reflux disease without esophagitis: Secondary | ICD-10-CM | POA: Insufficient documentation

## 2018-09-21 DIAGNOSIS — R1013 Epigastric pain: Secondary | ICD-10-CM | POA: Diagnosis not present

## 2018-09-21 DIAGNOSIS — Z86010 Personal history of colonic polyps: Secondary | ICD-10-CM | POA: Diagnosis not present

## 2018-09-21 DIAGNOSIS — R197 Diarrhea, unspecified: Secondary | ICD-10-CM | POA: Diagnosis not present

## 2018-09-22 DIAGNOSIS — R7303 Prediabetes: Secondary | ICD-10-CM | POA: Diagnosis not present

## 2018-09-22 DIAGNOSIS — E890 Postprocedural hypothyroidism: Secondary | ICD-10-CM | POA: Diagnosis not present

## 2018-09-22 DIAGNOSIS — C73 Malignant neoplasm of thyroid gland: Secondary | ICD-10-CM | POA: Diagnosis not present

## 2018-10-06 DIAGNOSIS — D123 Benign neoplasm of transverse colon: Secondary | ICD-10-CM | POA: Diagnosis not present

## 2018-10-06 DIAGNOSIS — R1310 Dysphagia, unspecified: Secondary | ICD-10-CM | POA: Diagnosis not present

## 2018-10-06 DIAGNOSIS — R1013 Epigastric pain: Secondary | ICD-10-CM | POA: Diagnosis not present

## 2018-10-06 DIAGNOSIS — R197 Diarrhea, unspecified: Secondary | ICD-10-CM | POA: Diagnosis not present

## 2018-10-06 DIAGNOSIS — K635 Polyp of colon: Secondary | ICD-10-CM | POA: Diagnosis not present

## 2018-10-06 DIAGNOSIS — K2950 Unspecified chronic gastritis without bleeding: Secondary | ICD-10-CM | POA: Diagnosis not present

## 2018-10-06 DIAGNOSIS — Z86010 Personal history of colonic polyps: Secondary | ICD-10-CM | POA: Diagnosis not present

## 2018-10-06 DIAGNOSIS — D125 Benign neoplasm of sigmoid colon: Secondary | ICD-10-CM | POA: Diagnosis not present

## 2018-10-06 DIAGNOSIS — K3189 Other diseases of stomach and duodenum: Secondary | ICD-10-CM | POA: Diagnosis not present

## 2018-11-17 DIAGNOSIS — Z86010 Personal history of colonic polyps: Secondary | ICD-10-CM | POA: Diagnosis not present

## 2018-11-17 DIAGNOSIS — R1011 Right upper quadrant pain: Secondary | ICD-10-CM | POA: Diagnosis not present

## 2018-11-17 DIAGNOSIS — K219 Gastro-esophageal reflux disease without esophagitis: Secondary | ICD-10-CM | POA: Diagnosis not present

## 2018-11-17 DIAGNOSIS — R197 Diarrhea, unspecified: Secondary | ICD-10-CM | POA: Diagnosis not present

## 2018-11-17 DIAGNOSIS — C73 Malignant neoplasm of thyroid gland: Secondary | ICD-10-CM | POA: Diagnosis not present

## 2018-11-27 DIAGNOSIS — Z45010 Encounter for checking and testing of cardiac pacemaker pulse generator [battery]: Secondary | ICD-10-CM | POA: Diagnosis not present

## 2018-11-27 DIAGNOSIS — Z45018 Encounter for adjustment and management of other part of cardiac pacemaker: Secondary | ICD-10-CM | POA: Diagnosis not present

## 2018-12-01 DIAGNOSIS — K219 Gastro-esophageal reflux disease without esophagitis: Secondary | ICD-10-CM | POA: Diagnosis not present

## 2018-12-01 DIAGNOSIS — K760 Fatty (change of) liver, not elsewhere classified: Secondary | ICD-10-CM | POA: Diagnosis not present

## 2018-12-01 DIAGNOSIS — R1013 Epigastric pain: Secondary | ICD-10-CM | POA: Diagnosis not present

## 2018-12-25 DIAGNOSIS — C73 Malignant neoplasm of thyroid gland: Secondary | ICD-10-CM | POA: Diagnosis not present

## 2019-02-01 ENCOUNTER — Encounter: Payer: Self-pay | Admitting: Family Medicine

## 2019-02-01 ENCOUNTER — Ambulatory Visit (INDEPENDENT_AMBULATORY_CARE_PROVIDER_SITE_OTHER): Payer: Medicaid Other | Admitting: Family Medicine

## 2019-02-01 DIAGNOSIS — E559 Vitamin D deficiency, unspecified: Secondary | ICD-10-CM

## 2019-02-01 DIAGNOSIS — R202 Paresthesia of skin: Secondary | ICD-10-CM

## 2019-02-01 DIAGNOSIS — R5383 Other fatigue: Secondary | ICD-10-CM

## 2019-02-01 NOTE — Progress Notes (Signed)
Pt needs to be seen in office for CPE. Scheduled for tomorrow. Will not charge for today.

## 2019-02-02 ENCOUNTER — Encounter: Payer: Self-pay | Admitting: Family Medicine

## 2019-02-02 ENCOUNTER — Other Ambulatory Visit: Payer: Self-pay

## 2019-02-02 ENCOUNTER — Ambulatory Visit (INDEPENDENT_AMBULATORY_CARE_PROVIDER_SITE_OTHER): Payer: Medicaid Other

## 2019-02-02 ENCOUNTER — Ambulatory Visit (INDEPENDENT_AMBULATORY_CARE_PROVIDER_SITE_OTHER): Payer: Medicaid Other | Admitting: Family Medicine

## 2019-02-02 VITALS — BP 148/90 | HR 77 | Temp 97.3°F | Resp 20 | Ht 66.0 in | Wt 256.0 lb

## 2019-02-02 DIAGNOSIS — R0600 Dyspnea, unspecified: Secondary | ICD-10-CM | POA: Diagnosis not present

## 2019-02-02 DIAGNOSIS — E782 Mixed hyperlipidemia: Secondary | ICD-10-CM

## 2019-02-02 DIAGNOSIS — E559 Vitamin D deficiency, unspecified: Secondary | ICD-10-CM | POA: Diagnosis not present

## 2019-02-02 DIAGNOSIS — I10 Essential (primary) hypertension: Secondary | ICD-10-CM | POA: Diagnosis not present

## 2019-02-02 DIAGNOSIS — R202 Paresthesia of skin: Secondary | ICD-10-CM

## 2019-02-02 DIAGNOSIS — R5383 Other fatigue: Secondary | ICD-10-CM | POA: Diagnosis not present

## 2019-02-02 DIAGNOSIS — E538 Deficiency of other specified B group vitamins: Secondary | ICD-10-CM | POA: Diagnosis not present

## 2019-02-02 DIAGNOSIS — R0609 Other forms of dyspnea: Secondary | ICD-10-CM

## 2019-02-02 NOTE — Progress Notes (Addendum)
Subjective:  Patient ID: Alison Wilkinson, female    DOB: January 09, 1971, 48 y.o.   MRN: [id number]  Patient Care Team: Baruch Gouty, FNP as PCP - General (Family Medicine)   Chief Complaint:  Fatigue   HPI: Alison Wilkinson is a 48 y.o. female presenting on 02/02/2019 for Fatigue   Pt presents to office today with complaints of increasing fatigue and malaise. Pt states she has noticed this over the last several months. Pt states she just does not feel like doing anything. Pt states she does have anxiety and takes klonopin as needed for this, does not take it often. States she does not like to take new medications and is very hesitant to start anything new. Pt states she continues to have orthopnea and DOE. States this has been intermittent for several years due to her underlying cardiovascular disease. She does follow up with cardiology on a regular basis. States she has noticed a slight increase in DOE. No PND. No chest pain or palpitations. Minimal lower extremity edema. She does report ongoing and worsening paresthesias of all extremities. No injury, weakness, or loss of function. States this has been present for at least 2 months. Previous lab review reveals hypomagnesemia, vit D deficiency, and vit B12 deficiency, and elevated cholesterol. Her TSH was low at last visit with endocrinology. She denies repletion therapy, states she is not comfortable with taking new medications.     Relevant past medical, surgical, family, and social history reviewed and updated as indicated.  Allergies and medications reviewed and updated. Date reviewed: Chart in Epic.   Past Medical History:  Diagnosis Date  . A-fib ([place])   . Anxiety   . Atrial tachycardia ([place])   . Cancer ([place])    thyroid  . Cardiomyopathy ([place])   . COPD (chronic obstructive pulmonary disease) ([place])   . Dyspnea   . Dysrhythmia   . GERD (gastroesophageal reflux disease)   . Headache   . High cholesterol   . History of kidney  stones   . Hypertension   . Hyperthyroidism   . Hypothyroid   . Mixed hyperlipidemia   . Pacemaker    biotronic  PPM   DR. Borden   . Pacemaker   . Pneumonia    hx  . PONV (postoperative nausea and vomiting)   . Shortness of breath dyspnea    WITH EXERTION   . Sleep apnea    mild case no cpap reccommended  . SSS (sick sinus syndrome) ([place])   . Stroke [facility])    2009   afib  (none in years)  . Syncope     Past Surgical History:  Procedure Laterality Date  . CARDIAC CATHETERIZATION     8/16  . HYSTEROSCOPY  2015  . MULTIPLE EXTRACTIONS WITH ALVEOLOPLASTY Bilateral 08/13/2016   Procedure: MULTIPLE EXTRACTIONS;  Surgeon: Diona Browner, DDS;  Location: [place];  Service: Oral Surgery;  Laterality: Bilateral;  . PACEMAKER INSERTION  2008  . THYROIDECTOMY Bilateral 04/25/2015   Procedure: THYROIDECTOMY;  Surgeon: Melida Quitter, MD;  Location: [facility];  Service: ENT;  Laterality: Bilateral;  TOTAL THYROIDECTOMY  . TUMOR REMOVAL  2013   UTERUS    Social History   Socioeconomic History  . Marital status: Single    Spouse name: Not on file  . Number of children: 1  . Years of education: Not on file  . Highest education level: Not on file  Occupational History  .  Occupation: Stay at home  Social Needs  . Financial resource strain: Not on file  . Food insecurity    Worry: Not on file    Inability: Not on file  . Transportation needs    Medical: Not on file    Non-medical: Not on file  Tobacco Use  . Smoking status: Former Smoker    Packs/day: 0.50    Years: 35.00    Pack years: 17.50    Types: Cigarettes    Quit date: 12/20/2016    Years since quitting: 2.1  . Smokeless tobacco: Former Network engineer and Sexual Activity  . Alcohol use: No    Comment: NONE IN 6 YEARS  . Drug use: No    Types: Marijuana    Comment: OVER 1 YEAR  . Sexual activity: Not Currently    Birth control/protection: None  Lifestyle  . Physical activity    Days per week: Not on  file    Minutes per session: Not on file  . Stress: Not on file  Relationships  . Social Herbalist on phone: Not on file    Gets together: Not on file    Attends religious service: Not on file    Active member of club or organization: Not on file    Attends meetings of clubs or organizations: Not on file    Relationship status: Not on file  . Intimate partner violence    Fear of current or ex partner: Not on file    Emotionally abused: Not on file    Physically abused: Not on file    Forced sexual activity: Not on file  Other Topics Concern  . Not on file  Social History Narrative  . Not on file    Outpatient Encounter Medications as of 02/02/2019  Medication Sig  . albuterol (PROVENTIL HFA;VENTOLIN HFA) 108 (90 Base) MCG/ACT inhaler Inhale 1-2 puffs into the lungs every 6 (six) hours as needed for wheezing or shortness of breath.  [name] albuterol (PROVENTIL) (2.5 MG/3ML) 0.083% nebulizer solution Take 3 mLs (2.5 mg total) by nebulization every 6 (six) hours as needed for wheezing or shortness of breath.  [name] aspirin EC 81 MG tablet Take 81 mg by mouth daily.  . clonazePAM (KLONOPIN) 0.5 MG tablet Take by mouth.  . levothyroxine (SYNTHROID) 112 MCG tablet Take 112 mcg by mouth daily before breakfast.  . metoprolol succinate (TOPROL-XL) 25 MG 24 hr tablet Take 25 mg by mouth daily.   [name] omeprazole (PRILOSEC) 40 MG capsule Take 40 mg by mouth 2 (two) times daily.  . [DISCONTINUED] omeprazole (PRILOSEC) 40 MG capsule Take 1 capsule (40 mg total) by mouth daily. (Patient taking differently: Take 40 mg by mouth 2 (two) times daily. )  . [DISCONTINUED] levothyroxine (SYNTHROID, LEVOTHROID) 137 MCG tablet Take 137 mcg by mouth once daily before breakfast   No facility-administered encounter medications on file as of 02/02/2019.     Allergies  Allergen Reactions  . Carvedilol Palpitations and Shortness Of Breath    Per pt report Per pt report Per pt report Per pt report Per pt  report  . Lisinopril Shortness Of Breath  . Sulfa Antibiotics Nausea Only, Other (See Comments) and Nausea And Vomiting    Headache  Headache  Headache  Headache  Headache      Review of Systems  Constitutional: Positive for activity change and fatigue. Negative for appetite change, chills, diaphoresis, fever and unexpected weight change.  HENT: Negative.  Eyes: Negative.  Negative for photophobia and visual disturbance.  Respiratory: Positive for cough (chronic, COPD). Negative for chest tightness and shortness of breath.   Cardiovascular: Positive for leg swelling. Negative for chest pain and palpitations.  Gastrointestinal: Negative for abdominal pain, blood in stool, constipation, diarrhea, nausea and vomiting.  Endocrine: Negative.  Negative for cold intolerance, heat intolerance, polydipsia, polyphagia and polyuria.  Genitourinary: Negative for decreased urine volume, difficulty urinating, dysuria, frequency and urgency.  Musculoskeletal: Positive for myalgias. Negative for arthralgias.  Skin: Negative.  Negative for color change and pallor.  Allergic/Immunologic: Negative.   Neurological: Positive for weakness (generalized), numbness and headaches. Negative for dizziness, tremors, syncope, facial asymmetry, speech difficulty and light-headedness.  Hematological: Negative.  Does not bruise/bleed easily.  Psychiatric/Behavioral: Positive for agitation and sleep disturbance. Negative for behavioral problems, confusion, decreased concentration, dysphoric mood, hallucinations, self-injury and suicidal ideas. The patient is nervous/anxious. The patient is not hyperactive.   All other systems reviewed and are negative.       Objective:  BP (!) 148/90 (BP Location: Left Arm, Cuff Size: Normal)   Pulse 77   Temp (!) 97.3 F (36.3 C) (Temporal)   Resp 20   Ht '5\' 6"'  (1.676 m)   Wt 256 lb (116.1 kg)   SpO2 100%   BMI 41.32 kg/m    Wt Readings from Last 3 Encounters:  02/02/19  256 lb (116.1 kg)  04/11/18 243 lb (110.2 kg)  08/01/17 240 lb 3.2 oz (109 kg)    Physical Exam Vitals signs and nursing note reviewed.  Constitutional:      General: She is not in acute distress.    Appearance: Normal appearance. She is well-developed and well-groomed. She is obese. She is not ill-appearing, toxic-appearing or diaphoretic.  HENT:     Head: Normocephalic and atraumatic.     Jaw: There is normal jaw occlusion.     Right Ear: Hearing, tympanic membrane, ear canal and external ear normal.     Left Ear: Hearing, tympanic membrane, ear canal and external ear normal.     Nose: Nose normal.     Mouth/Throat:     Lips: Pink.     Mouth: Mucous membranes are moist.     Pharynx: Oropharynx is clear. Uvula midline.  Eyes:     General: Lids are normal.     Extraocular Movements: Extraocular movements intact.     Conjunctiva/sclera: Conjunctivae normal.     Pupils: Pupils are equal, round, and reactive to light.  Neck:     Musculoskeletal: Normal range of motion and neck supple.     Thyroid: No thyroid mass, thyromegaly or thyroid tenderness.     Vascular: No carotid bruit or JVD.     Trachea: Trachea and phonation normal.  Cardiovascular:     Rate and Rhythm: Normal rate and regular rhythm.     Chest Wall: PMI is not displaced.     Pulses: Normal pulses.     Heart sounds: Normal heart sounds. No murmur. No friction rub. No gallop.   Pulmonary:     Effort: Pulmonary effort is normal. No respiratory distress.     Breath sounds: No stridor. Rhonchi present. No wheezing or rales.  Abdominal:     General: Bowel sounds are normal. There is no distension or abdominal bruit.     Palpations: Abdomen is soft. There is no hepatomegaly or splenomegaly.     Tenderness: There is no abdominal tenderness. There is no right CVA tenderness or left CVA  tenderness.     Hernia: No hernia is present.  Musculoskeletal: Normal range of motion.     Right lower leg: 1+ Edema present.     Left  lower leg: 1+ Edema present.  Lymphadenopathy:     Cervical: No cervical adenopathy.  Skin:    General: Skin is warm and dry.     Capillary Refill: Capillary refill takes less than 2 seconds.     Coloration: Skin is not cyanotic, jaundiced or pale.     Findings: No rash.  Neurological:     General: No focal deficit present.     Mental Status: She is alert and oriented to person, place, and time.     Cranial Nerves: Cranial nerves are intact. No cranial nerve deficit.     Sensory: Sensation is intact. No sensory deficit.     Motor: Motor function is intact. No weakness.     Coordination: Coordination is intact. Coordination normal.     Gait: Gait is intact. Gait normal.     Deep Tendon Reflexes: Reflexes are normal and symmetric. Reflexes normal.  Psychiatric:        Attention and Perception: Attention and perception normal.        Mood and Affect: Mood and affect normal.        Speech: Speech normal.        Behavior: Behavior normal. Behavior is cooperative.        Thought Content: Thought content normal.        Cognition and Memory: Cognition and memory normal.        Judgment: Judgment normal.     Results for orders placed or performed in visit on 04/11/18  Rapid Strep Screen ([facility] ONLY)   Specimen: Other   OTHER  Result Value Ref Range   Strep Gp A Ag, IA W/Reflex Negative Negative  Culture, Group A Strep   OTHER  Result Value Ref Range   Strep A Culture CANCELED   Epstein-Barr virus VCA antibody panel  Result Value Ref Range   EBV VCA IgM <36.0 0.0 - 35.9 U/mL   EBV Early Antigen Ab, IgG 17.3 (H) 0.0 - 8.9 U/mL   EBV VCA IgG >600.0 (H) 0.0 - 17.9 U/mL   EBV NA IgG 152.0 (H) 0.0 - 17.9 U/mL   Interpretation: Comment        Pertinent labs & imaging results that were available during my care of the patient were reviewed by me and considered in my medical decision making.  Assessment & Plan:  Tarri was seen today for fatigue.  Diagnoses and all orders  for this visit:  Other fatigue Paresthesia B12 nutritional deficiency Vitamin D deficiency Will check below to determine possible underlying causes. Report any new or worsening symptoms. Keep follow ups with cardiology and endocrinology.  -     CMP14+EGFR -     CBC with Differential/Platelet -     Thyroid Panel With TSH -     Vitamin B12, Vit D -     DG Chest 2 View; Future -     Brain natriuretic peptide  Essential hypertension BP elevated today. Pt very anxious today. Pt reports good control at home. DASH diet discussed. Will check below.  -     CMP14+EGFR -     CBC with Differential/Platelet -     Thyroid Panel With TSH  Hypomagnesemia Will recheck labs today.  -     Magnesium  Mixed hyperlipidemia Diet and exercise encouraged.  Will check labs today.  -     Lipid panel  DOE (dyspnea on exertion) Report any new or worsening symptoms. Will get CXR and BNP.  -     DG Chest 2 View; Future -     Brain natriuretic peptide     Continue all other maintenance medications.  Follow up plan: Return in 4 weeks (on 03/02/2019), or if symptoms worsen or fail to improve.  Continue healthy lifestyle choices, including diet (rich in fruits, vegetables, and lean proteins, and low in salt and simple carbohydrates) and exercise (at least 30 minutes of moderate physical activity daily).  Educational handout given for DASH diet  The above assessment and management plan was discussed with the patient. The patient verbalized understanding of and has agreed to the management plan. Patient is aware to call the clinic if they develop any new symptoms or if symptoms persist or worsen. Patient is aware when to return to the clinic for a follow-up visit. Patient educated on when it is appropriate to go to the emergency department.   Monia Pouch, FNP-C [facility] [phone]

## 2019-02-02 NOTE — Patient Instructions (Signed)
DASH Eating Plan DASH stands for "Dietary Approaches to Stop Hypertension." The DASH eating plan is a healthy eating plan that has been shown to reduce high blood pressure (hypertension). Additional health benefits may include reducing the risk of type 2 diabetes mellitus, heart disease, and stroke. The DASH eating plan may also help with weight loss.  WHAT DO I NEED TO KNOW ABOUT THE DASH EATING PLAN? For the DASH eating plan, you will follow these general guidelines:  Choose foods with a percent daily value for sodium of less than 5% (as listed on the food label).  Use salt-free seasonings or herbs instead of table salt or sea salt.  Check with your health care provider or pharmacist before using salt substitutes.  Eat lower-sodium products, often labeled as "lower sodium" or "no salt added."  Eat fresh foods.  Eat more vegetables, fruits, and low-fat dairy products.  Choose whole grains. Look for the word "whole" as the first word in the ingredient list.  Choose fish and skinless chicken or turkey more often than red meat. Limit fish, poultry, and meat to 6 oz (170 g) each day.  Limit sweets, desserts, sugars, and sugary drinks.  Choose heart-healthy fats.  Limit cheese to 1 oz (28 g) per day.  Eat more home-cooked food and less restaurant, buffet, and fast food.  Limit fried foods.  Cook foods using methods other than frying.  Limit canned vegetables. If you do use them, rinse them well to decrease the sodium.  When eating at a restaurant, ask that your food be prepared with less salt, or no salt if possible.  WHAT FOODS CAN I EAT? Seek help from a dietitian for individual calorie needs.  Grains Whole grain or whole wheat bread. Brown rice. Whole grain or whole wheat pasta. Quinoa, bulgur, and whole grain cereals. Low-sodium cereals. Corn or whole wheat flour tortillas. Whole grain cornbread. Whole grain crackers. Low-sodium crackers.  Vegetables Fresh or frozen  vegetables (raw, steamed, roasted, or grilled). Low-sodium or reduced-sodium tomato and vegetable juices. Low-sodium or reduced-sodium tomato sauce and paste. Low-sodium or reduced-sodium canned vegetables.   Fruits All fresh, canned (in natural juice), or frozen fruits.  Meat and Other Protein Products Ground beef (85% or leaner), grass-fed beef, or beef trimmed of fat. Skinless chicken or turkey. Ground chicken or turkey. Pork trimmed of fat. All fish and seafood. Eggs. Dried beans, peas, or lentils. Unsalted nuts and seeds. Unsalted canned beans.  Dairy Low-fat dairy products, such as skim or 1% milk, 2% or reduced-fat cheeses, low-fat ricotta or cottage cheese, or plain low-fat yogurt. Low-sodium or reduced-sodium cheeses.  Fats and Oils Tub margarines without trans fats. Light or reduced-fat mayonnaise and salad dressings (reduced sodium). Avocado. Safflower, olive, or canola oils. Natural peanut or almond butter.  Other Unsalted popcorn and pretzels. The items listed above may not be a complete list of recommended foods or beverages. Contact your dietitian for more options.  WHAT FOODS ARE NOT RECOMMENDED?  Grains White bread. White pasta. White rice. Refined cornbread. Bagels and croissants. Crackers that contain trans fat.  Vegetables Creamed or fried vegetables. Vegetables in a cheese sauce. Regular canned vegetables. Regular canned tomato sauce and paste. Regular tomato and vegetable juices.  Fruits Dried fruits. Canned fruit in light or heavy syrup. Fruit juice.  Meat and Other Protein Products Fatty cuts of meat. Ribs, chicken wings, bacon, sausage, bologna, salami, chitterlings, fatback, hot dogs, bratwurst, and packaged luncheon meats. Salted nuts and seeds. Canned beans with salt.    Dairy Whole or 2% milk, cream, half-and-half, and cream cheese. Whole-fat or sweetened yogurt. Full-fat cheeses or blue cheese. Nondairy creamers and whipped toppings. Processed cheese,  cheese spreads, or cheese curds.  Condiments Onion and garlic salt, seasoned salt, table salt, and sea salt. Canned and packaged gravies. Worcestershire sauce. Tartar sauce. Barbecue sauce. Teriyaki sauce. Soy sauce, including reduced sodium. Steak sauce. Fish sauce. Oyster sauce. Cocktail sauce. Horseradish. Ketchup and mustard. Meat flavorings and tenderizers. Bouillon cubes. Hot sauce. Tabasco sauce. Marinades. Taco seasonings. Relishes.  Fats and Oils Butter, stick margarine, lard, shortening, ghee, and bacon fat. Coconut, palm kernel, or palm oils. Regular salad dressings.  Other Pickles and olives. Salted popcorn and pretzels.  The items listed above may not be a complete list of foods and beverages to avoid. Contact your dietitian for more information.  WHERE CAN I FIND MORE INFORMATION? National Heart, Lung, and [employer] Institute: www.nhlbi.nih.gov/health/health-topics/topics/dash/ Document Released: 03/04/2011 Document Revised: 07/30/2013 Document Reviewed: 01/17/2013 ExitCare Patient Information 2015 [employer]. This information is not intended to replace advice given to you by your health care provider. Make sure you discuss any questions you have with your health care provider.   I think that you would greatly benefit from seeing a nutritionist.  If you are interested, please call Dr Sykes at [phone] to schedule an appointment.   

## 2019-02-03 LAB — CBC WITH DIFFERENTIAL/PLATELET
Basophils Absolute: 0 10*3/uL (ref 0.0–0.2)
Basos: 1 %
EOS (ABSOLUTE): 0.1 10*3/uL (ref 0.0–0.4)
Eos: 2 %
Hematocrit: 43.2 % (ref 34.0–46.6)
Hemoglobin: 14 g/dL (ref 11.1–15.9)
Immature Grans (Abs): 0 10*3/uL (ref 0.0–0.1)
Immature Granulocytes: 0 %
Lymphocytes Absolute: 2.2 10*3/uL (ref 0.7–3.1)
Lymphs: 38 %
MCH: 29.5 pg (ref 26.6–33.0)
MCHC: 32.4 g/dL (ref 31.5–35.7)
MCV: 91 fL (ref 79–97)
Monocytes Absolute: 0.4 10*3/uL (ref 0.1–0.9)
Monocytes: 7 %
Neutrophils Absolute: 3 10*3/uL (ref 1.4–7.0)
Neutrophils: 52 %
Platelets: 327 10*3/uL (ref 150–450)
RBC: 4.75 x10E6/uL (ref 3.77–5.28)
RDW: 14 % (ref 11.7–15.4)
WBC: 5.8 10*3/uL (ref 3.4–10.8)

## 2019-02-03 LAB — LIPID PANEL
Chol/HDL Ratio: 6 ratio — ABNORMAL HIGH (ref 0.0–4.4)
Cholesterol, Total: 311 mg/dL — ABNORMAL HIGH (ref 100–199)
HDL: 52 mg/dL (ref >39)
LDL Chol Calc (NIH): 230 mg/dL — ABNORMAL HIGH (ref 0–99)
Triglycerides: 154 mg/dL — ABNORMAL HIGH (ref 0–149)
VLDL Cholesterol Cal: 29 mg/dL (ref 5–40)

## 2019-02-03 LAB — THYROID PANEL WITH TSH
Free Thyroxine Index: 2.8 (ref 1.2–4.9)
T3 Uptake Ratio: 27 % (ref 24–39)
T4, Total: 10.4 ug/dL (ref 4.5–12.0)
TSH: 2.12 u[IU]/mL (ref 0.450–4.500)

## 2019-02-03 LAB — CMP14+EGFR
ALT: 17 IU/L (ref 0–32)
AST: 12 IU/L (ref 0–40)
Albumin/Globulin Ratio: 1.6 (ref 1.2–2.2)
Albumin: 4.7 g/dL (ref 3.8–4.8)
Alkaline Phosphatase: 65 IU/L (ref 39–117)
BUN/Creatinine Ratio: 24 — ABNORMAL HIGH (ref 9–23)
BUN: 21 mg/dL (ref 6–24)
Bilirubin Total: 0.4 mg/dL (ref 0.0–1.2)
CO2: 24 mmol/L (ref 20–29)
Calcium: 9.8 mg/dL (ref 8.7–10.2)
Chloride: 102 mmol/L (ref 96–106)
Creatinine, Ser: 0.87 mg/dL (ref 0.57–1.00)
GFR calc Af Amer: 92 mL/min/{1.73_m2} (ref >59)
GFR calc non Af Amer: 80 mL/min/{1.73_m2} (ref >59)
Globulin, Total: 3 g/dL (ref 1.5–4.5)
Glucose: 88 mg/dL (ref 65–99)
Potassium: 4.8 mmol/L (ref 3.5–5.2)
Sodium: 140 mmol/L (ref 134–144)
Total Protein: 7.7 g/dL (ref 6.0–8.5)

## 2019-02-03 LAB — BRAIN NATRIURETIC PEPTIDE: BNP: 3.5 pg/mL (ref 0.0–100.0)

## 2019-02-03 LAB — VITAMIN B12: Vitamin B-12: 277 pg/mL (ref 232–1245)

## 2019-02-03 LAB — VITAMIN D 25 HYDROXY (VIT D DEFICIENCY, FRACTURES): Vit D, 25-Hydroxy: 20.4 ng/mL — ABNORMAL LOW (ref 30.0–100.0)

## 2019-02-03 LAB — MAGNESIUM: Magnesium: 2.1 mg/dL (ref 1.6–2.3)

## 2019-02-05 MED ORDER — ERGOCALCIFEROL 1.25 MG (50000 UT) PO CAPS: 50000.0000 [IU] | ORAL_CAPSULE | ORAL | 0 refills | Status: DC

## 2019-02-05 NOTE — Addendum Note (Signed)
Addended by: Baruch Gouty on: 02/05/2019 01:36 PM   Modules accepted: Orders

## 2019-02-28 ENCOUNTER — Telehealth: Payer: Self-pay | Admitting: Family Medicine

## 2019-02-28 NOTE — Telephone Encounter (Signed)
Patient is having problems with anxiety and wants an appointment.  Scheduled appointment for a tele visit with Monia Pouch on 03/01/2019 at 9:05 am.

## 2019-03-01 ENCOUNTER — Ambulatory Visit (INDEPENDENT_AMBULATORY_CARE_PROVIDER_SITE_OTHER): Payer: Medicaid Other | Admitting: Family Medicine

## 2019-03-01 ENCOUNTER — Encounter: Payer: Self-pay | Admitting: Family Medicine

## 2019-03-01 DIAGNOSIS — F410 Panic disorder [episodic paroxysmal anxiety] without agoraphobia: Secondary | ICD-10-CM | POA: Diagnosis not present

## 2019-03-01 MED ORDER — HYDROXYZINE PAMOATE 25 MG PO CAPS: 25.0000 mg | ORAL_CAPSULE | Freq: Three times a day (TID) | ORAL | 1 refills | Status: AC | PRN

## 2019-03-01 NOTE — Progress Notes (Signed)
Virtual Visit via telephone Note Due to COVID-19 pandemic this visit was conducted virtually. This visit type was conducted due to national recommendations for restrictions regarding the COVID-19 Pandemic (e.g. social distancing, sheltering in place) in an effort to limit this patient's exposure and mitigate transmission in our community. All issues noted in this document were discussed and addressed.  A physical exam was not performed with this format.   I connected with Alison Wilkinson on 03/01/2019 at 0855 by telephone and verified that I am speaking with the correct person using two identifiers. Alison Wilkinson is currently located at home and family is currently with them during visit. The provider, Monia Pouch, FNP is located in their office at time of visit.  I discussed the limitations, risks, security and privacy concerns of performing an evaluation and management service by telephone and the availability of in person appointments. I also discussed with the patient that there may be a patient responsible charge related to this service. The patient expressed understanding and agreed to proceed.  Subjective:  Patient ID: Alison Wilkinson, female    DOB: Aug 25, 1970, 48 y.o.   MRN: [id number]  Chief Complaint:  Anxiety   HPI: Alison Wilkinson is a 48 y.o. female presenting on 03/01/2019 for Anxiety   Pt has ongoing anxiety. States she has been having more frequent episodes of anxiety and near panic over the last several weeks. States she will get very anxious and start hyperventilating. States she sits in front of a fan to help herself calm down. States she has not taken anything for the symptoms. No SI or HI. She reports she is very cautious with medications and does not like to take new medications or medications on a regular basis.   Anxiety Presents for follow-up visit. Symptoms include compulsions, decreased concentration, depressed mood, dry mouth, excessive worry, hyperventilation,  insomnia, irritability, malaise, muscle tension, nervous/anxious behavior, obsessions, palpitations, panic, restlessness and shortness of breath. Patient reports no chest pain, confusion, dizziness, feeling of choking, impotence, nausea or suicidal ideas. Symptoms occur occasionally. The severity of symptoms is moderate and causing significant distress. The quality of sleep is fair.     Depression screen [facility] 2/9 03/01/2019 02/02/2019 02/01/2019 04/11/2018 08/01/2017  Decreased Interest 1 0 0 0 0  Down, Depressed, Hopeless 1 0 0 0 0  PHQ - 2 Score 2 0 0 0 0  Altered sleeping - - - - -  Tired, decreased energy 1 - - - -  Change in appetite 0 - - - -  Feeling bad or failure about yourself  0 - - - -  Trouble concentrating 1 - - - -  Moving slowly or fidgety/restless 0 - - - -  Suicidal thoughts 0 - - - -  PHQ-9 Score - - - - -  Difficult doing work/chores - - - - -   GAD 7 : Generalized Anxiety Score 03/01/2019  Nervous, Anxious, on Edge 1  Control/stop worrying 1  Worry too much - different things 1  Trouble relaxing 1  Restless 0  Easily annoyed or irritable 1  Afraid - awful might happen 1  Total GAD 7 Score 6       Relevant past medical, surgical, family, and social history reviewed and updated as indicated.  Allergies and medications reviewed and updated.   Past Medical History:  Diagnosis Date  . A-fib ([place])   . Anxiety   . Atrial tachycardia ([facility])   . Cancer ([place])  thyroid  . Cardiomyopathy ([place])   . COPD (chronic obstructive pulmonary disease) ([place])   . Dyspnea   . Dysrhythmia   . GERD (gastroesophageal reflux disease)   . Headache   . High cholesterol   . History of kidney stones   . Hypertension   . Hyperthyroidism   . Hypothyroid   . Mixed hyperlipidemia   . Pacemaker    biotronic  PPM   DR. [place]   . Pacemaker   . Pneumonia    hx  . PONV (postoperative nausea and vomiting)   . Shortness of breath dyspnea    WITH EXERTION   . Sleep  apnea    mild case no cpap reccommended  . SSS (sick sinus syndrome) ([place])   . Stroke [facility])    2009   afib  (none in years)  . Syncope     Past Surgical History:  Procedure Laterality Date  . CARDIAC CATHETERIZATION     8/16  . HYSTEROSCOPY  2015  . MULTIPLE EXTRACTIONS WITH ALVEOLOPLASTY Bilateral 08/13/2016   Procedure: MULTIPLE EXTRACTIONS;  Surgeon: Diona Browner, DDS;  Location: [facility];  Service: Oral Surgery;  Laterality: Bilateral;  . PACEMAKER INSERTION  2008  . THYROIDECTOMY Bilateral 04/25/2015   Procedure: THYROIDECTOMY;  Surgeon: Melida Quitter, MD;  Location: [facility];  Service: ENT;  Laterality: Bilateral;  TOTAL THYROIDECTOMY  . TUMOR REMOVAL  2013   UTERUS    Social History   Socioeconomic History  . Marital status: Single    Spouse name: Not on file  . Number of children: 1  . Years of education: Not on file  . Highest education level: Not on file  Occupational History  . Occupation: Stay at home  Social Needs  . Financial resource strain: Not on file  . Food insecurity    Worry: Not on file    Inability: Not on file  . Transportation needs    Medical: Not on file    Non-medical: Not on file  Tobacco Use  . Smoking status: Former Smoker    Packs/day: 0.50    Years: 35.00    Pack years: 17.50    Types: Cigarettes    Quit date: 12/20/2016    Years since quitting: 2.1  . Smokeless tobacco: Former Network engineer and Sexual Activity  . Alcohol use: No    Comment: NONE IN 6 YEARS  . Drug use: No    Types: Marijuana    Comment: OVER 1 YEAR  . Sexual activity: Not Currently    Birth control/protection: None  Lifestyle  . Physical activity    Days per week: Not on file    Minutes per session: Not on file  . Stress: Not on file  Relationships  . Social Herbalist on phone: Not on file    Gets together: Not on file    Attends religious service: Not on file    Active member of club or organization: Not on file    Attends meetings of clubs or  organizations: Not on file    Relationship status: Not on file  . Intimate partner violence    Fear of current or ex partner: Not on file    Emotionally abused: Not on file    Physically abused: Not on file    Forced sexual activity: Not on file  Other Topics Concern  . Not on file  Social History Narrative  . Not on file  Outpatient Encounter Medications as of 03/01/2019  Medication Sig  . albuterol (PROVENTIL HFA;VENTOLIN HFA) 108 (90 Base) MCG/ACT inhaler Inhale 1-2 puffs into the lungs every 6 (six) hours as needed for wheezing or shortness of breath.  [name] albuterol (PROVENTIL) (2.5 MG/3ML) 0.083% nebulizer solution Take 3 mLs (2.5 mg total) by nebulization every 6 (six) hours as needed for wheezing or shortness of breath.  [name] aspirin EC 81 MG tablet Take 81 mg by mouth daily.  . clonazePAM (KLONOPIN) 0.5 MG tablet Take by mouth.  . ergocalciferol (VITAMIN D2) 1.25 MG (50000 UT) capsule Take 1 capsule (50,000 Units total) by mouth once a week for 30 doses.  . hydrOXYzine (VISTARIL) 25 MG capsule Take 1 capsule (25 mg total) by mouth 3 (three) times daily as needed for anxiety.  [name] levothyroxine (SYNTHROID) 112 MCG tablet Take 112 mcg by mouth daily before breakfast.  . metoprolol succinate (TOPROL-XL) 25 MG 24 hr tablet Take 25 mg by mouth daily.   [name] omeprazole (PRILOSEC) 40 MG capsule Take 40 mg by mouth 2 (two) times daily.   No facility-administered encounter medications on file as of 03/01/2019.     Allergies  Allergen Reactions  . Carvedilol Palpitations and Shortness Of Breath    Per pt report Per pt report Per pt report Per pt report Per pt report  . Lisinopril Shortness Of Breath  . Sulfa Antibiotics Nausea Only, Other (See Comments) and Nausea And Vomiting    Headache  Headache  Headache  Headache  Headache      Review of Systems  Constitutional: Positive for appetite change, diaphoresis, fatigue and irritability. Negative for activity change, chills, fever and  unexpected weight change.  Eyes: Negative.  Negative for photophobia and visual disturbance.  Respiratory: Positive for shortness of breath. Negative for cough and chest tightness.   Cardiovascular: Positive for palpitations. Negative for chest pain and leg swelling.  Gastrointestinal: Negative for abdominal pain, blood in stool, constipation, diarrhea, nausea and vomiting.  Endocrine: Negative.   Genitourinary: Negative for decreased urine volume, difficulty urinating, dysuria, frequency, impotence and urgency.  Musculoskeletal: Negative for arthralgias and myalgias.  Skin: Negative.  Negative for color change and rash.  Allergic/Immunologic: Negative.   Neurological: Negative for dizziness and headaches.  Hematological: Negative.   Psychiatric/Behavioral: Positive for agitation, decreased concentration, dysphoric mood and sleep disturbance. Negative for behavioral problems, confusion, hallucinations, self-injury and suicidal ideas. The patient is nervous/anxious and has insomnia. The patient is not hyperactive.   All other systems reviewed and are negative.        Observations/Objective: No vital signs or physical exam, this was a telephone or virtual health encounter.  Pt alert and oriented, answers all questions appropriately, and able to speak in full sentences.    Assessment and Plan: Alison Wilkinson was seen today for anxiety.  Diagnoses and all orders for this visit:  Anxiety attack Ongoing anxiety with anxiety attacks. Pt does not wish to start a new medication on a regular basis. Would like something to help with severe symptoms to take as needed. Will trial below. Pt aware of symptoms that require emergent evaluation and treatment. Declines referral to psychiatry or daily medication. Report any new, worsening , or persistent symptoms. Follow up in 4 weeks for reevaluation.  -     hydrOXYzine (VISTARIL) 25 MG capsule; Take 1 capsule (25 mg total) by mouth 3 (three) times daily as  needed for anxiety.     Follow Up Instructions: Return in about 4 weeks (around  03/29/2019), or if symptoms worsen or fail to improve, for Anxiety.    I discussed the assessment and treatment plan with the patient. The patient was provided an opportunity to ask questions and all were answered. The patient agreed with the plan and demonstrated an understanding of the instructions.   The patient was advised to call back or seek an in-person evaluation if the symptoms worsen or if the condition fails to improve as anticipated.  The above assessment and management plan was discussed with the patient. The patient verbalized understanding of and has agreed to the management plan. Patient is aware to call the clinic if they develop any new symptoms or if symptoms persist or worsen. Patient is aware when to return to the clinic for a follow-up visit. Patient educated on when it is appropriate to go to the emergency department.    I provided 25 minutes of non-face-to-face time during this encounter. The call started at 0855. The call ended at 0920. The other time was used for coordination of care.    Monia Pouch, FNP-C [facility] [address] [place], [place] [address] [phone] 03/01/2019

## 2019-03-02 DIAGNOSIS — C73 Malignant neoplasm of thyroid gland: Secondary | ICD-10-CM | POA: Diagnosis not present

## 2019-03-02 DIAGNOSIS — E890 Postprocedural hypothyroidism: Secondary | ICD-10-CM | POA: Diagnosis not present

## 2019-03-09 DIAGNOSIS — I495 Sick sinus syndrome: Secondary | ICD-10-CM | POA: Diagnosis not present

## 2019-03-09 DIAGNOSIS — I10 Essential (primary) hypertension: Secondary | ICD-10-CM | POA: Diagnosis not present

## 2019-03-09 DIAGNOSIS — I428 Other cardiomyopathies: Secondary | ICD-10-CM | POA: Diagnosis not present

## 2019-03-09 DIAGNOSIS — I471 Supraventricular tachycardia: Secondary | ICD-10-CM | POA: Diagnosis not present

## 2019-03-09 DIAGNOSIS — Z45018 Encounter for adjustment and management of other part of cardiac pacemaker: Secondary | ICD-10-CM | POA: Diagnosis not present

## 2019-03-09 DIAGNOSIS — I480 Paroxysmal atrial fibrillation: Secondary | ICD-10-CM | POA: Diagnosis not present

## 2019-03-09 DIAGNOSIS — Z45010 Encounter for checking and testing of cardiac pacemaker pulse generator [battery]: Secondary | ICD-10-CM | POA: Diagnosis not present

## 2019-04-30 ENCOUNTER — Other Ambulatory Visit: Payer: Self-pay

## 2019-05-01 ENCOUNTER — Other Ambulatory Visit: Payer: Self-pay

## 2019-05-02 ENCOUNTER — Ambulatory Visit (INDEPENDENT_AMBULATORY_CARE_PROVIDER_SITE_OTHER): Payer: Medicaid Other | Admitting: Family Medicine

## 2019-05-02 ENCOUNTER — Encounter: Payer: Self-pay | Admitting: Family Medicine

## 2019-05-02 ENCOUNTER — Ambulatory Visit (INDEPENDENT_AMBULATORY_CARE_PROVIDER_SITE_OTHER): Payer: Medicaid Other

## 2019-05-02 VITALS — BP 147/85 | HR 77 | Temp 97.1°F | Resp 18 | Ht 66.0 in | Wt 267.6 lb

## 2019-05-02 DIAGNOSIS — M25562 Pain in left knee: Secondary | ICD-10-CM

## 2019-05-02 DIAGNOSIS — G8929 Other chronic pain: Secondary | ICD-10-CM

## 2019-05-02 MED ORDER — METHYLPREDNISOLONE ACETATE 80 MG/ML IJ SUSP
60.0000 mg | Freq: Once | INTRAMUSCULAR | Status: AC
  Administered 2019-05-02: 16:00:00 60 mg via INTRA_ARTICULAR

## 2019-05-02 NOTE — Progress Notes (Signed)
Subjective:  Patient ID: Alison Wilkinson, female    DOB: [birth year], 49 y.o.   MRN: [id number]  Patient Care Team: Baruch Gouty, FNP as PCP - General (Family Medicine)   Chief Complaint:  Knee Pain (left x 4 months)   HPI: Alison Wilkinson is a 49 y.o. female presenting on 05/02/2019 for Knee Pain (left x 4 months)   Knee Pain  Incident onset: 4 months ago. The incident occurred at home (after painting). There was no injury mechanism. The pain is present in the left knee. The pain is at a severity of 6/10. The pain is moderate. The pain has been fluctuating since onset. Pertinent negatives include no inability to bear weight, loss of motion, loss of sensation, muscle weakness, numbness or tingling. She reports no foreign bodies present. The symptoms are aggravated by movement, palpation and weight bearing. She has tried elevation, heat, ice, immobilization and rest for the symptoms. The treatment provided mild relief.        Relevant past medical, surgical, family, and social history reviewed and updated as indicated.  Allergies and medications reviewed and updated. Date reviewed: Chart in Epic.   Past Medical History:  Diagnosis Date  . A-fib ([place])   . Anxiety   . Atrial tachycardia ([place])   . Cancer ([place])    thyroid  . Cardiomyopathy ([place])   . COPD (chronic obstructive pulmonary disease) ([place])   . Dyspnea   . Dysrhythmia   . GERD (gastroesophageal reflux disease)   . Headache   . High cholesterol   . History of kidney stones   . Hypertension   . Hyperthyroidism   . Hypothyroid   . Mixed hyperlipidemia   . Pacemaker    biotronic  PPM   DR. Barrington   . Pacemaker   . Pneumonia    hx  . PONV (postoperative nausea and vomiting)   . Shortness of breath dyspnea    WITH EXERTION   . Sleep apnea    mild case no cpap reccommended  . SSS (sick sinus syndrome) ([place])   . Stroke [facility])    2009   afib  (none in years)  . Syncope     Past Surgical  History:  Procedure Laterality Date  . CARDIAC CATHETERIZATION     8/16  . HYSTEROSCOPY  2015  . MULTIPLE EXTRACTIONS WITH ALVEOLOPLASTY Bilateral 08/13/2016   Procedure: MULTIPLE EXTRACTIONS;  Surgeon: Diona Browner, DDS;  Location: [place];  Service: Oral Surgery;  Laterality: Bilateral;  . PACEMAKER INSERTION  2008  . THYROIDECTOMY Bilateral 04/25/2015   Procedure: THYROIDECTOMY;  Surgeon: Melida Quitter, MD;  Location: [facility];  Service: ENT;  Laterality: Bilateral;  TOTAL THYROIDECTOMY  . TUMOR REMOVAL  2013   UTERUS    Social History   Socioeconomic History  . Marital status: Single    Spouse name: Not on file  . Number of children: 1  . Years of education: Not on file  . Highest education level: Not on file  Occupational History  . Occupation: Stay at home  Tobacco Use  . Smoking status: Former Smoker    Packs/day: 0.50    Years: 35.00    Pack years: 17.50    Types: Cigarettes    Quit date: 12/20/2016    Years since quitting: 2.3  . Smokeless tobacco: Former Network engineer and Sexual Activity  . Alcohol use: No    Comment: NONE IN 6 YEARS  .  Drug use: No    Types: Marijuana    Comment: OVER 1 YEAR  . Sexual activity: Not Currently    Birth control/protection: None  Other Topics Concern  . Not on file  Social History Narrative  . Not on file   Social Determinants of Health   Financial Resource Strain:   . Difficulty of Paying Living Expenses: Not on file  Food Insecurity:   . Worried About Charity fundraiser in the Last Year: Not on file  . Ran Out of Food in the Last Year: Not on file  Transportation Needs:   . Lack of Transportation (Medical): Not on file  . Lack of Transportation (Non-Medical): Not on file  Physical Activity:   . Days of Exercise per Week: Not on file  . Minutes of Exercise per Session: Not on file  Stress:   . Feeling of Stress : Not on file  Social Connections:   . Frequency of Communication with Friends and Family: Not on file  .  Frequency of Social Gatherings with Friends and Family: Not on file  . Attends Religious Services: Not on file  . Active Member of Clubs or Organizations: Not on file  . Attends Archivist Meetings: Not on file  . Marital Status: Not on file  Intimate Partner Violence:   . Fear of Current or Ex-Partner: Not on file  . Emotionally Abused: Not on file  . Physically Abused: Not on file  . Sexually Abused: Not on file    Outpatient Encounter Medications as of 05/02/2019  Medication Sig  . albuterol (PROVENTIL HFA;VENTOLIN HFA) 108 (90 Base) MCG/ACT inhaler Inhale 1-2 puffs into the lungs every 6 (six) hours as needed for wheezing or shortness of breath.  [name] albuterol (PROVENTIL) (2.5 MG/3ML) 0.083% nebulizer solution Take 3 mLs (2.5 mg total) by nebulization every 6 (six) hours as needed for wheezing or shortness of breath.  [name] aspirin EC 81 MG tablet Take 81 mg by mouth daily.  . clonazePAM (KLONOPIN) 0.5 MG tablet Take by mouth.  . ergocalciferol (VITAMIN D2) 1.25 MG (50000 UT) capsule Take 1 capsule (50,000 Units total) by mouth once a week for 30 doses.  [name] levothyroxine (SYNTHROID) 112 MCG tablet Take 112 mcg by mouth daily before breakfast.  . metoprolol succinate (TOPROL-XL) 25 MG 24 hr tablet Take 25 mg by mouth daily.   [name] omeprazole (PRILOSEC) 40 MG capsule Take 40 mg by mouth 2 (two) times daily.  . [EXPIRED] methylPREDNISolone acetate (DEPO-MEDROL) injection 60 mg    No facility-administered encounter medications on file as of 05/02/2019.    Allergies  Allergen Reactions  . Carvedilol Palpitations and Shortness Of Breath    _0   . Lisinopril Shortness Of Breath  . Sulfa Antibiotics Nausea Only, Other (See Comments) and Nausea And Vomiting    Headache  Headache  Headache  Headache  Headache      Review of Systems  Constitutional: Negative for activity change, appetite change, chills, diaphoresis,  fatigue, fever and unexpected weight change.  HENT: Negative.   Eyes: Negative.   Respiratory: Negative for cough, chest tightness and shortness of breath.   Cardiovascular: Negative for chest pain, palpitations and leg swelling.  Gastrointestinal: Negative for abdominal pain, blood in stool, constipation, diarrhea, nausea and vomiting.  Endocrine: Negative.   Genitourinary: Negative for dysuria, frequency and urgency.  Musculoskeletal: Positive for arthralgias, gait problem, joint swelling and  myalgias.  Skin: Negative.  Negative for color change and wound.  Allergic/Immunologic: Negative.   Neurological: Negative for dizziness, tingling, numbness and headaches.  Hematological: Negative.   Psychiatric/Behavioral: Negative for confusion, hallucinations, sleep disturbance and suicidal ideas.  All other systems reviewed and are negative.       Objective:  BP (!) 147/85   Pulse 77   Temp (!) 97.1 F (36.2 C)   Resp 18   Ht _0  (1.676 m)   Wt 267 lb 9.6 oz (121.4 kg)   SpO2 100%   BMI 43.19 kg/m    Wt Readings from Last 3 Encounters:  05/02/19 267 lb 9.6 oz (121.4 kg)  02/02/19 256 lb (116.1 kg)  04/11/18 243 lb (110.2 kg)    Physical Exam Vitals and nursing note reviewed.  Constitutional:      General: She is not in acute distress.    Appearance: Normal appearance. She is well-developed and well-groomed. She is morbidly obese. She is not ill-appearing, toxic-appearing or diaphoretic.  HENT:     Head: Normocephalic and atraumatic.     Jaw: There is normal jaw occlusion.     Right Ear: Hearing normal.     Left Ear: Hearing normal.     Nose: Nose normal.     Mouth/Throat:     Lips: Pink.     Mouth: Mucous membranes are moist.     Pharynx: Oropharynx is clear. Uvula midline.  Eyes:     General: Lids are normal.     Extraocular Movements: Extraocular movements intact.     Conjunctiva/sclera: Conjunctivae normal.     Pupils: Pupils are equal, round, and reactive to  light.  Neck:     Thyroid: No thyroid mass, thyromegaly or thyroid tenderness.     Vascular: No carotid bruit or JVD.     Trachea: Trachea and phonation normal.  Cardiovascular:     Rate and Rhythm: Normal rate and regular rhythm.     Chest Wall: PMI is not displaced.     Pulses: Normal pulses.     Heart sounds: Normal heart sounds. No murmur. No friction rub. No gallop.   Pulmonary:     Effort: Pulmonary effort is normal. No respiratory distress.     Breath sounds: Normal breath sounds. No wheezing.  Abdominal:     General: Bowel sounds are normal. There is no distension or abdominal bruit.     Palpations: Abdomen is soft. There is no hepatomegaly or splenomegaly.     Tenderness: There is no abdominal tenderness. There is no right CVA tenderness or left CVA tenderness.     Hernia: No hernia is present.  Musculoskeletal:     Cervical back: Normal range of motion and neck supple.     Right hip: Normal.     Left hip: Normal.     Right knee: Normal.     Left knee: Effusion present. No bony tenderness or crepitus. Decreased range of motion (flexion and extension due to discomfort). Tenderness present over the medial joint line. No LCL laxity, MCL laxity, ACL laxity or PCL laxity.Normal alignment, normal meniscus and normal patellar mobility. Normal pulse.     Instability Tests: Negative medial McMurray test and negative lateral McMurray test.     Right lower leg: No edema.     Left lower leg: Normal. No edema.     Right ankle: Normal.     Left ankle: Normal.  Lymphadenopathy:     Cervical: No cervical adenopathy.  Skin:  General: Skin is warm and dry.     Capillary Refill: Capillary refill takes less than 2 seconds.     Coloration: Skin is not cyanotic, jaundiced or pale.     Findings: No rash.  Neurological:     General: No focal deficit present.     Mental Status: She is alert and oriented to person, place, and time.     Cranial Nerves: Cranial nerves are intact.     Sensory:  Sensation is intact.     Motor: Motor function is intact.     Coordination: Coordination is intact.     Gait: Gait is intact.     Deep Tendon Reflexes: Reflexes are normal and symmetric.  Psychiatric:        Attention and Perception: Attention and perception normal.        Mood and Affect: Mood and affect normal.        Speech: Speech normal.        Behavior: Behavior normal. Behavior is cooperative.        Thought Content: Thought content normal.        Cognition and Memory: Cognition and memory normal.        Judgment: Judgment normal.     Results for orders placed or performed in visit on 02/02/19  CMP14+EGFR  Result Value Ref Range   Glucose 88 65 - 99 mg/dL   BUN 21 6 - 24 mg/dL   Creatinine, Ser 0.87 0.57 - 1.00 mg/dL   GFR calc non Af Amer 80 >59 mL/min/1.73   GFR calc Af Amer 92 >59 mL/min/1.73   BUN/Creatinine Ratio 24 (H) 9 - 23   Sodium 140 134 - 144 mmol/L   Potassium 4.8 3.5 - 5.2 mmol/L   Chloride 102 96 - 106 mmol/L   CO2 24 20 - 29 mmol/L   Calcium 9.8 8.7 - 10.2 mg/dL   Total Protein 7.7 6.0 - 8.5 g/dL   Albumin 4.7 3.8 - 4.8 g/dL   Globulin, Total 3.0 1.5 - 4.5 g/dL   Albumin/Globulin Ratio 1.6 1.2 - 2.2   Bilirubin Total 0.4 0.0 - 1.2 mg/dL   Alkaline Phosphatase 65 39 - 117 IU/L   AST 12 0 - 40 IU/L   ALT 17 0 - 32 IU/L  CBC with Differential/Platelet  Result Value Ref Range   WBC 5.8 3.4 - 10.8 x10E3/uL   RBC 4.75 3.77 - 5.28 x10E6/uL   Hemoglobin 14.0 11.1 - 15.9 g/dL   Hematocrit 43.2 34.0 - 46.6 %   MCV 91 79 - 97 fL   MCH 29.5 26.6 - 33.0 pg   MCHC 32.4 31.5 - 35.7 g/dL   RDW 14.0 11.7 - 15.4 %   Platelets 327 150 - 450 x10E3/uL   Neutrophils 52 Not Estab. %   Lymphs 38 Not Estab. %   Monocytes 7 Not Estab. %   Eos 2 Not Estab. %   Basos 1 Not Estab. %   Neutrophils Absolute 3.0 1.4 - 7.0 x10E3/uL   Lymphocytes Absolute 2.2 0.7 - 3.1 x10E3/uL   Monocytes Absolute 0.4 0.1 - 0.9 x10E3/uL   EOS (ABSOLUTE) 0.1 0.0 - 0.4 x10E3/uL    Basophils Absolute 0.0 0.0 - 0.2 x10E3/uL   Immature Granulocytes 0 Not Estab. %   Immature Grans (Abs) 0.0 0.0 - 0.1 x10E3/uL  Lipid panel  Result Value Ref Range   Cholesterol, Total 311 (H) 100 - 199 mg/dL   Triglycerides 154 (H) 0 - 149 mg/dL  HDL 52 >39 mg/dL   VLDL Cholesterol Cal 29 5 - 40 mg/dL   LDL Chol Calc (NIH) 230 (H) 0 - 99 mg/dL   Lipid Comment: Comment    Chol/HDL Ratio 6.0 (H) 0.0 - 4.4 ratio  Thyroid Panel With TSH  Result Value Ref Range   TSH 2.120 0.450 - 4.500 uIU/mL   T4, Total 10.4 4.5 - 12.0 ug/dL   T3 Uptake Ratio 27 24 - 39 %   Free Thyroxine Index 2.8 1.2 - 4.9  Vitamin B12  Result Value Ref Range   Vitamin B-12 277 232 - 1,245 pg/mL  Vitamin D 25 hydroxy  Result Value Ref Range   Vit D, 25-Hydroxy 20.4 (L) 30.0 - 100.0 ng/mL  Magnesium  Result Value Ref Range   Magnesium 2.1 1.6 - 2.3 mg/dL  Brain natriuretic peptide  Result Value Ref Range   BNP 3.5 0.0 - 100.0 pg/mL     Joint Injection/Arthrocentesis  Date/Time: 05/02/2019 4:10 PM Performed by: Baruch Gouty, FNP Authorized by: Baruch Gouty, FNP  Indications: pain and joint swelling  Body area: knee Joint: left knee Local anesthesia used: yes  Anesthesia: Local anesthesia used: yes Local Anesthetic: lidocaine spray  Sedation: Patient sedated: no  Preparation: Patient was prepped and draped in the usual sterile fashion (Pt gave verbal consent. Pts name, DOB, allergies, and procedure verified prior to procedure). Needle size: 22 G Ultrasound guidance: no Approach: anterior Aspirate amount: 0 mL Methylprednisolone amount: 60 mg Lidocaine 2% amount: 3.5 mL Patient tolerance: patient tolerated the procedure well with no immediate complications    X-Ray: left knee: no bony abnormality, small joint effusion, preserved joint space. Preliminary x-ray reading by Monia Pouch, FNP-C, [facility].   Pertinent labs & imaging results that were available during my care of the patient were  reviewed by me and considered in my medical decision making.  Assessment & Plan:  Alison Wilkinson was seen today for knee pain.  Diagnoses and all orders for this visit:  Chronic pain of left knee Ongoing left knee pain. Xray without bony abnormalities, small joint effusion, and preserved joint space. Will notify pt if radiology reading differs. Pt does not like to take oral medications and opted for joint injection today. Pt tolerated injection well. Knee brace applied in office. Pt can use topical Voltaren as needed for pain. Symptomatic care discussed in detail. Follow up in 4-6 weeks for reevaluation.  -     DG Knee 1-2 Views Left; Future -     methylPREDNISolone acetate (DEPO-MEDROL) injection 60 mg -     Joint Injection/Arthrocentesis     Continue all other maintenance medications.  Follow up plan: Return in 6 weeks (on 06/13/2019), or if symptoms worsen or fail to improve, for knee pain.  Continue healthy lifestyle choices, including diet (rich in fruits, vegetables, and lean proteins, and low in salt and simple carbohydrates) and exercise (at least 30 minutes of moderate physical activity daily).  Educational handout given for knee pain, joint injection   The above assessment and management plan was discussed with the patient. The patient verbalized understanding of and has agreed to the management plan. Patient is aware to call the clinic if they develop any new symptoms or if symptoms persist or worsen. Patient is aware when to return to the clinic for a follow-up visit. Patient educated on when it is appropriate to go to the emergency department.   Monia Pouch, FNP-C [facility] [phone]

## 2019-05-02 NOTE — Patient Instructions (Signed)
Joint Steroid Injection A joint steroid injection is a procedure to relieve swelling and pain in a joint. Steroids are medicines that reduce inflammation. In this procedure, your health care provider uses a syringe and a needle to inject a steroid medicine into a painful and inflamed joint. A pain-relieving medicine (anesthetic) may be injected along with the steroid. In some cases, your health care provider may use an imaging technique such as ultrasound or fluoroscopy to guide the injection. Joints that are often treated with steroid injections include the knee, shoulder, hip, and spine. These injections may also be used in the elbow, ankle, and joints of the hands or feet. You may have joint steroid injections as part of your treatment for inflammation caused by:  Gout.  Rheumatoid arthritis.  Advanced wear-and-tear arthritis (osteoarthritis).  Tendinitis.  Bursitis. Joint steroid injections may be repeated, but having them too often can damage a joint or the skin over the joint. You should not have joint steroid injections less than 6 weeks apart or more than four times a year. Tell a health care provider about:  Any allergies you have.  All medicines you are taking, including vitamins, herbs, eye drops, creams, and over-the-counter medicines.  Any problems you or family members have had with anesthetic medicines.  Any blood disorders you have.  Any surgeries you have had.  Any medical conditions you have.  Whether you are pregnant or may be pregnant. What are the risks? Generally, this is a safe treatment. However, problems may occur, including:  Infection.  Bleeding.  Allergic reactions to medicines.  Damage to the joint or tissues around the joint.  Thinning of skin or loss of skin color over the joint.  Temporary flushing of the face or chest.  Temporary increase in pain.  Temporary increase in blood sugar.  Failure to relieve inflammation or pain. What  happens before the treatment?  You may have imaging tests of your joint.  Ask your health care provider about: ? Changing or stopping your regular medicines. This is especially important if you are taking diabetes medicines or blood thinners. ? Taking medicines such as aspirin and ibuprofen. These medicines can thin your blood. Do not take these medicines unless your health care provider tells you to take them. ? Taking over-the-counter medicines, vitamins, herbs, and supplements.  Ask your health care provider if you can drive yourself home after the procedure. What happens during the treatment?   Your health care provider will position you for the injection and locate the injection site over your joint.  The skin over the joint will be cleaned with a germ-killing soap.  Your health care provider may: ? Spray a numbing solution (topical anesthetic) over the injection site. ? Inject a local anesthetic under the skin above your joint.  The needle will be placed through your skin into your joint. Your health care provider may use imaging to guide the needle to the right spot for the injection. If imaging is used, a special contrast dye may be injected to confirm that the needle is in the correct location.  The steroid medicine will be injected into your joint.  Anesthetic may be injected along with the steroid. This may be a medicine that relieves pain for a short time (short-acting anesthetic) or for a longer time (long-acting anesthetic).  The needle will be removed, and an adhesive bandage (dressing) will be placed over the injection site. The procedure may vary among health care providers and hospitals. What can I   expect after the treatment?  You will be able to go home after the treatment.  It is normal to feel slight flushing for a few days after the injection.  After the treatment, it is common to have an increase in joint pain after the anesthetic has worn off. This may  happen about an hour after a short-acting anesthetic or about 8 hours after a longer-acting anesthetic.  You should begin to feel relief from joint pain and swelling after 24 to 48 hours. Follow these instructions at home: Injection site care  Leave the adhesive dressing over your injection site in place until your health care provider says you can remove it.  Check your injection site every day for signs of infection. Check for: ? Redness, swelling, or pain. ? Fluid or blood. ? Warmth. ? Pus or a bad smell. Activity  Return to your normal activities as told by your health care provider. Ask your health care provider what activities are safe for you. You may be asked to limit activities that put stress on the joint for a few days.  Do joint exercises as told by your health care provider.  Do not take baths, swim, or use a hot tub until your health care provider approves. Managing pain, stiffness, and swelling   If directed, put ice on the joint. ? Put ice in a plastic bag. ? Place a towel between your skin and the bag. ? Leave the ice on for 20 minutes, 2-3 times a day.  Raise (elevate) your joint above the level of your heart when you are sitting or lying down. General instructions  Take over-the-counter and prescription medicines only as told by your health care provider.  Do not use any products that contain nicotine or tobacco, such as cigarettes, e-cigarettes, and chewing tobacco. These can delay joint healing. If you need help quitting, ask your health care provider.  If you have diabetes, be aware that your blood sugar may be slightly elevated for several days after the injection.  Keep all follow-up visits as told by your health care provider. This is important. Contact a health care provider if you have:  Chills or a fever.  Any signs of infection at your injection site.  Increased pain or swelling or no relief after 2 days. Summary  A joint steroid injection  is a treatment to relieve pain and swelling in a joint.  Steroids are medicines that reduce inflammation. Your health care provider may add an anesthetic along with the steroid.  You may have joint steroid injections as part of your arthritis treatment.  Joint steroid injections may be repeated, but having them too often can damage a joint or the skin over the joint.  Contact your health care provider if you have a fever, chills, or signs of infection or if you get no relief from joint pain or swelling. This information is not intended to replace advice given to you by your health care provider. Make sure you discuss any questions you have with your health care provider. Document Revised: 11/15/2017 Document Reviewed: 11/15/2017 Elsevier Patient Education  2020 [employer]  

## 2019-05-15 ENCOUNTER — Telehealth: Payer: Self-pay | Admitting: Family Medicine

## 2019-05-16 ENCOUNTER — Ambulatory Visit: Payer: Medicaid Other | Admitting: Family Medicine

## 2019-05-29 ENCOUNTER — Other Ambulatory Visit: Payer: Self-pay

## 2019-05-30 ENCOUNTER — Other Ambulatory Visit: Payer: Self-pay

## 2019-05-30 ENCOUNTER — Ambulatory Visit: Payer: Medicaid Other | Admitting: Family Medicine

## 2019-05-30 ENCOUNTER — Encounter: Payer: Self-pay | Admitting: Family Medicine

## 2019-05-30 VITALS — BP 135/84 | HR 75 | Temp 98.5°F | Ht 66.0 in | Wt 269.4 lb

## 2019-05-30 DIAGNOSIS — M25562 Pain in left knee: Secondary | ICD-10-CM

## 2019-05-30 DIAGNOSIS — G8929 Other chronic pain: Secondary | ICD-10-CM | POA: Diagnosis not present

## 2019-05-30 MED ORDER — HYDROCODONE-ACETAMINOPHEN 5-325 MG PO TABS: 1.0000 | ORAL_TABLET | Freq: Four times a day (QID) | ORAL | 0 refills | Status: DC | PRN

## 2019-05-30 NOTE — Progress Notes (Addendum)
Subjective:  Patient ID: Alison Wilkinson, female    DOB: 09/21/1970, 49 y.o.   MRN: [id number]  Patient Care Team: Baruch Gouty, FNP as PCP - General (Family Medicine)   Chief Complaint:  Knee Pain   HPI: Alison Wilkinson is a 49 y.o. female presenting on 05/30/2019 for Knee Pain   Knee Pain  The incident occurred more than 1 week ago. The incident occurred at home. The injury mechanism was a twisting injury. The pain is present in the left knee. The quality of the pain is described as aching, shooting and stabbing. The pain is at a severity of 8/10. The pain is severe. The pain has been constant since onset. Associated symptoms include an inability to bear weight. Pertinent negatives include no loss of motion, loss of sensation, muscle weakness, numbness or tingling. She reports no foreign bodies present. The symptoms are aggravated by movement, palpation and weight bearing. She has tried acetaminophen, heat, ice, non-weight bearing, elevation, rest and NSAIDs for the symptoms. The treatment provided no relief.   There are no diagnoses linked to this encounter.    Relevant past medical, surgical, family, and social history reviewed and updated as indicated.  Allergies and medications reviewed and updated. Date reviewed: Chart in Epic.   Past Medical History:  Diagnosis Date  . A-fib ([place])   . Anxiety   . Atrial tachycardia ([place])   . Cancer ([place])    thyroid  . Cardiomyopathy ([place])   . COPD (chronic obstructive pulmonary disease) ([place])   . Dyspnea   . Dysrhythmia   . GERD (gastroesophageal reflux disease)   . Headache   . High cholesterol   . History of kidney stones   . Hypertension   . Hyperthyroidism   . Hypothyroid   . Mixed hyperlipidemia   . Pacemaker    biotronic  PPM   DR. [place]   . Pacemaker   . Pneumonia    hx  . PONV (postoperative nausea and vomiting)   . Shortness of breath dyspnea    WITH EXERTION   . Sleep apnea    mild case no  cpap reccommended  . SSS (sick sinus syndrome) ([place])   . Stroke [facility])    2009   afib  (none in years)  . Syncope     Past Surgical History:  Procedure Laterality Date  . CARDIAC CATHETERIZATION     8/16  . HYSTEROSCOPY  2015  . MULTIPLE EXTRACTIONS WITH ALVEOLOPLASTY Bilateral 08/13/2016   Procedure: MULTIPLE EXTRACTIONS;  Surgeon: Diona Browner, DDS;  Location: [facility];  Service: Oral Surgery;  Laterality: Bilateral;  . PACEMAKER INSERTION  2008  . THYROIDECTOMY Bilateral 04/25/2015   Procedure: THYROIDECTOMY;  Surgeon: Melida Quitter, MD;  Location: [facility];  Service: ENT;  Laterality: Bilateral;  TOTAL THYROIDECTOMY  . TUMOR REMOVAL  2013   UTERUS    Social History   Socioeconomic History  . Marital status: Single    Spouse name: Not on file  . Number of children: 1  . Years of education: Not on file  . Highest education level: Not on file  Occupational History  . Occupation: Stay at home  Tobacco Use  . Smoking status: Former Smoker    Packs/day: 0.50    Years: 35.00    Pack years: 17.50    Types: Cigarettes    Quit date: 12/20/2016    Years since quitting: 2.4  . Smokeless tobacco: Former Systems developer  Substance and Sexual Activity  . Alcohol use: No    Comment: NONE IN 6 YEARS  . Drug use: No    Types: Marijuana    Comment: OVER 1 YEAR  . Sexual activity: Not Currently    Birth control/protection: None  Other Topics Concern  . Not on file  Social History Narrative  . Not on file   Social Determinants of Health   Financial Resource Strain:   . Difficulty of Paying Living Expenses: Not on file  Food Insecurity:   . Worried About Charity fundraiser in the Last Year: Not on file  . Ran Out of Food in the Last Year: Not on file  Transportation Needs:   . Lack of Transportation (Medical): Not on file  . Lack of Transportation (Non-Medical): Not on file  Physical Activity:   . Days of Exercise per Week: Not on file  . Minutes of Exercise per Session: Not on file    Stress:   . Feeling of Stress : Not on file  Social Connections:   . Frequency of Communication with Friends and Family: Not on file  . Frequency of Social Gatherings with Friends and Family: Not on file  . Attends Religious Services: Not on file  . Active Member of Clubs or Organizations: Not on file  . Attends Archivist Meetings: Not on file  . Marital Status: Not on file  Intimate Partner Violence:   . Fear of Current or Ex-Partner: Not on file  . Emotionally Abused: Not on file  . Physically Abused: Not on file  . Sexually Abused: Not on file    Outpatient Encounter Medications as of 05/30/2019  Medication Sig  . albuterol (PROVENTIL HFA;VENTOLIN HFA) 108 (90 Base) MCG/ACT inhaler Inhale 1-2 puffs into the lungs every 6 (six) hours as needed for wheezing or shortness of breath.  [name] albuterol (PROVENTIL) (2.5 MG/3ML) 0.083% nebulizer solution Take 3 mLs (2.5 mg total) by nebulization every 6 (six) hours as needed for wheezing or shortness of breath.  [name] aspirin EC 81 MG tablet Take 81 mg by mouth daily.  . clonazePAM (KLONOPIN) 0.5 MG tablet Take by mouth.  . ergocalciferol (VITAMIN D2) 1.25 MG (50000 UT) capsule Take 1 capsule (50,000 Units total) by mouth once a week for 30 doses.  [name] levothyroxine (SYNTHROID) 112 MCG tablet Take 112 mcg by mouth daily before breakfast.  . metoprolol succinate (TOPROL-XL) 25 MG 24 hr tablet Take 25 mg by mouth daily.   [name] omeprazole (PRILOSEC) 40 MG capsule Take 40 mg by mouth 2 (two) times daily.  [name] HYDROcodone-acetaminophen (NORCO/VICODIN) 5-325 MG tablet Take 1 tablet by mouth every 6 (six) hours as needed for moderate pain.   No facility-administered encounter medications on file as of 05/30/2019.    Allergies  Allergen Reactions  . Carvedilol Palpitations and Shortness Of Breath    Per pt report Per pt report Per pt report Per pt report Per pt report  . Lisinopril Shortness Of Breath  . Sulfa Antibiotics Nausea Only, Other (See  Comments) and Nausea And Vomiting    Headache  Headache  Headache  Headache  Headache      Review of Systems  Constitutional: Negative for activity change, appetite change, chills, fatigue and fever.  HENT: Negative.   Eyes: Negative.   Respiratory: Negative for cough, chest tightness and shortness of breath.   Cardiovascular: Negative for chest pain, palpitations and leg swelling.  Gastrointestinal: Negative for blood in stool, constipation, diarrhea,  nausea and vomiting.  Endocrine: Negative.   Genitourinary: Negative for dysuria, frequency and urgency.  Musculoskeletal: Positive for arthralgias. Negative for myalgias.  Skin: Negative.   Allergic/Immunologic: Negative.   Neurological: Negative for dizziness, tingling, numbness and headaches.  Hematological: Negative.   Psychiatric/Behavioral: Negative for confusion, hallucinations, sleep disturbance and suicidal ideas.  All other systems reviewed and are negative.       Objective:  BP 135/84   Pulse 75   Temp 98.5 F (36.9 C) (Oral)   Ht '5\' 6"'  (1.676 m)   Wt 269 lb 6 oz (122.2 kg)   BMI 43.48 kg/m    Wt Readings from Last 3 Encounters:  05/30/19 269 lb 6 oz (122.2 kg)  05/02/19 267 lb 9.6 oz (121.4 kg)  02/02/19 256 lb (116.1 kg)    Physical Exam Vitals and nursing note reviewed.  Constitutional:      General: She is not in acute distress.    Appearance: Normal appearance. She is well-developed and well-groomed. She is obese. She is not ill-appearing, toxic-appearing or diaphoretic.  HENT:     Head: Normocephalic and atraumatic.     Jaw: There is normal jaw occlusion.     Right Ear: Hearing normal.     Left Ear: Hearing normal.     Nose: Nose normal.     Mouth/Throat:     Lips: Pink.     Mouth: Mucous membranes are moist.     Pharynx: Oropharynx is clear. Uvula midline.  Eyes:     General: Lids are normal.     Extraocular Movements: Extraocular movements intact.     Conjunctiva/sclera: Conjunctivae  normal.     Pupils: Pupils are equal, round, and reactive to light.  Neck:     Thyroid: No thyroid mass, thyromegaly or thyroid tenderness.     Vascular: No carotid bruit or JVD.     Trachea: Trachea and phonation normal.  Cardiovascular:     Rate and Rhythm: Normal rate and regular rhythm.     Chest Wall: PMI is not displaced.     Pulses: Normal pulses.     Heart sounds: Normal heart sounds. No murmur. No friction rub. No gallop.   Pulmonary:     Effort: Pulmonary effort is normal. No respiratory distress.     Breath sounds: Normal breath sounds. No wheezing.  Abdominal:     General: Bowel sounds are normal. There is no distension or abdominal bruit.     Palpations: Abdomen is soft. There is no hepatomegaly or splenomegaly.     Tenderness: There is no abdominal tenderness. There is no right CVA tenderness or left CVA tenderness.     Hernia: No hernia is present.  Musculoskeletal:        General: Tenderness present.     Cervical back: Normal range of motion and neck supple.     Left hip: Normal.     Right knee: Normal.     Left knee: No swelling, deformity, effusion, erythema, ecchymosis, lacerations, bony tenderness or crepitus. Decreased range of motion. Tenderness present over the patellar tendon.     Right lower leg: No edema.     Left lower leg: No edema.     Left ankle: Normal.       Legs:  Lymphadenopathy:     Cervical: No cervical adenopathy.  Skin:    General: Skin is warm and dry.     Capillary Refill: Capillary refill takes less than 2 seconds.     Coloration: Skin  is not cyanotic, jaundiced or pale.     Findings: No rash.  Neurological:     General: No focal deficit present.     Mental Status: She is alert and oriented to person, place, and time.     Cranial Nerves: Cranial nerves are intact. No cranial nerve deficit.     Sensory: Sensation is intact. No sensory deficit.     Motor: Motor function is intact. No weakness.     Coordination: Coordination is intact.  Coordination normal.     Gait: Gait is intact. Gait normal.     Deep Tendon Reflexes: Reflexes are normal and symmetric. Reflexes normal.  Psychiatric:        Attention and Perception: Attention and perception normal.        Mood and Affect: Mood and affect normal.        Speech: Speech normal.        Behavior: Behavior normal. Behavior is cooperative.        Thought Content: Thought content normal.        Cognition and Memory: Cognition and memory normal.        Judgment: Judgment normal.     Results for orders placed or performed in visit on 02/02/19  CMP14+EGFR  Result Value Ref Range   Glucose 88 65 - 99 mg/dL   BUN 21 6 - 24 mg/dL   Creatinine, Ser 0.87 0.57 - 1.00 mg/dL   GFR calc non Af Amer 80 >59 mL/min/1.73   GFR calc Af Amer 92 >59 mL/min/1.73   BUN/Creatinine Ratio 24 (H) 9 - 23   Sodium 140 134 - 144 mmol/L   Potassium 4.8 3.5 - 5.2 mmol/L   Chloride 102 96 - 106 mmol/L   CO2 24 20 - 29 mmol/L   Calcium 9.8 8.7 - 10.2 mg/dL   Total Protein 7.7 6.0 - 8.5 g/dL   Albumin 4.7 3.8 - 4.8 g/dL   Globulin, Total 3.0 1.5 - 4.5 g/dL   Albumin/Globulin Ratio 1.6 1.2 - 2.2   Bilirubin Total 0.4 0.0 - 1.2 mg/dL   Alkaline Phosphatase 65 39 - 117 IU/L   AST 12 0 - 40 IU/L   ALT 17 0 - 32 IU/L  CBC with Differential/Platelet  Result Value Ref Range   WBC 5.8 3.4 - 10.8 x10E3/uL   RBC 4.75 3.77 - 5.28 x10E6/uL   Hemoglobin 14.0 11.1 - 15.9 g/dL   Hematocrit 43.2 34.0 - 46.6 %   MCV 91 79 - 97 fL   MCH 29.5 26.6 - 33.0 pg   MCHC 32.4 31.5 - 35.7 g/dL   RDW 14.0 11.7 - 15.4 %   Platelets 327 150 - 450 x10E3/uL   Neutrophils 52 Not Estab. %   Lymphs 38 Not Estab. %   Monocytes 7 Not Estab. %   Eos 2 Not Estab. %   Basos 1 Not Estab. %   Neutrophils Absolute 3.0 1.4 - 7.0 x10E3/uL   Lymphocytes Absolute 2.2 0.7 - 3.1 x10E3/uL   Monocytes Absolute 0.4 0.1 - 0.9 x10E3/uL   EOS (ABSOLUTE) 0.1 0.0 - 0.4 x10E3/uL   Basophils Absolute 0.0 0.0 - 0.2 x10E3/uL   Immature  Granulocytes 0 Not Estab. %   Immature Grans (Abs) 0.0 0.0 - 0.1 x10E3/uL  Lipid panel  Result Value Ref Range   Cholesterol, Total 311 (H) 100 - 199 mg/dL   Triglycerides 154 (H) 0 - 149 mg/dL   HDL 52 >39 mg/dL   VLDL Cholesterol Cal  29 5 - 40 mg/dL   LDL Chol Calc (NIH) 230 (H) 0 - 99 mg/dL   Lipid Comment: Comment    Chol/HDL Ratio 6.0 (H) 0.0 - 4.4 ratio  Thyroid Panel With TSH  Result Value Ref Range   TSH 2.120 0.450 - 4.500 uIU/mL   T4, Total 10.4 4.5 - 12.0 ug/dL   T3 Uptake Ratio 27 24 - 39 %   Free Thyroxine Index 2.8 1.2 - 4.9  Vitamin B12  Result Value Ref Range   Vitamin B-12 277 232 - 1,245 pg/mL  Vitamin D 25 hydroxy  Result Value Ref Range   Vit D, 25-Hydroxy 20.4 (L) 30.0 - 100.0 ng/mL  Magnesium  Result Value Ref Range   Magnesium 2.1 1.6 - 2.3 mg/dL  Brain natriuretic peptide  Result Value Ref Range   BNP 3.5 0.0 - 100.0 pg/mL       Pertinent labs & imaging results that were available during my care of the patient were reviewed by me and considered in my medical decision making.  Assessment & Plan:  Suleika was seen today for knee pain.  Diagnoses and all orders for this visit:  Chronic pain of left knee Has failed conservative therapy and steroid joint injection. Will refer to PT and ortho. Will give short course of norco for significant pain. Referrals placed. Pt to continue symptomatic care at home. Report any new or worsening symptoms.  -     Ambulatory referral to Orthopedic Surgery -     Ambulatory referral to Physical Therapy -     HYDROcodone-acetaminophen (NORCO/VICODIN) 5-325 MG tablet; Take 1 tablet by mouth every 6 (six) hours as needed for moderate pain.     Continue all other maintenance medications.  Follow up plan: Return in about 8 weeks (around 07/25/2019), or if symptoms worsen or fail to improve, for knee pain.  Continue healthy lifestyle choices, including diet (rich in fruits, vegetables, and lean proteins, and low in salt  and simple carbohydrates) and exercise (at least 30 minutes of moderate physical activity daily).  The above assessment and management plan was discussed with the patient. The patient verbalized understanding of and has agreed to the management plan. Patient is aware to call the clinic if they develop any new symptoms or if symptoms persist or worsen. Patient is aware when to return to the clinic for a follow-up visit. Patient educated on when it is appropriate to go to the emergency department.   Robynn Pane, FNP student [facility] [phone]   I personally was present during the history, physical exam, and medical decision-making activities of this service and have verified that the service and findings are accurately documented in the nurse practitioner student's note.  Monia Pouch, FNP-C [facility] [address] [place], [place] [address] [phone]

## 2019-05-30 NOTE — Addendum Note (Signed)
Addended by: Baruch Gouty on: 05/30/2019 06:38 PM   Modules accepted: Orders

## 2019-06-01 ENCOUNTER — Encounter: Payer: Self-pay | Admitting: Physical Therapy

## 2019-06-01 ENCOUNTER — Other Ambulatory Visit: Payer: Self-pay

## 2019-06-01 ENCOUNTER — Ambulatory Visit: Payer: Medicaid Other | Attending: Family Medicine | Admitting: Physical Therapy

## 2019-06-01 DIAGNOSIS — G8929 Other chronic pain: Secondary | ICD-10-CM | POA: Insufficient documentation

## 2019-06-01 DIAGNOSIS — R262 Difficulty in walking, not elsewhere classified: Secondary | ICD-10-CM | POA: Diagnosis not present

## 2019-06-01 DIAGNOSIS — M25562 Pain in left knee: Secondary | ICD-10-CM | POA: Diagnosis not present

## 2019-06-01 DIAGNOSIS — M25662 Stiffness of left knee, not elsewhere classified: Secondary | ICD-10-CM | POA: Insufficient documentation

## 2019-06-01 NOTE — Therapy (Signed)
[facility] [place], [place], [address] Phone: [phone]   Fax:  [phone]  Physical Therapy Evaluation  Patient Details  Name: Alison Wilkinson MRN: [id number] Date of Birth: [birth year] Referring Provider (PT): Baruch Gouty, FNP   Encounter Date: 06/01/2019  PT End of Session - 06/01/19 1411    Visit Number  1    Number of Visits  8    Date for PT Re-Evaluation  07/27/19    Authorization Type  Medicaid    PT Start Time  0815    PT Stop Time  [id number]    PT Time Calculation (min)  40 min    Activity Tolerance  Patient limited by pain    Behavior During Therapy  [facility] for tasks assessed/performed       Past Medical History:  Diagnosis Date  . A-fib ([place])   . Anxiety   . Atrial tachycardia ([place])   . Cancer ([place])    thyroid  . Cardiomyopathy ([place])   . COPD (chronic obstructive pulmonary disease) ([place])   . Dyspnea   . Dysrhythmia   . GERD (gastroesophageal reflux disease)   . Headache   . High cholesterol   . History of kidney stones   . Hypertension   . Hyperthyroidism   . Hypothyroid   . Mixed hyperlipidemia   . Pacemaker    biotronic  PPM   DR. Erwin   . Pacemaker   . Pneumonia    hx  . PONV (postoperative nausea and vomiting)   . Shortness of breath dyspnea    WITH EXERTION   . Sleep apnea    mild case no cpap reccommended  . SSS (sick sinus syndrome) ([place])   . Stroke [facility])    2009   afib  (none in years)  . Syncope     Past Surgical History:  Procedure Laterality Date  . CARDIAC CATHETERIZATION     8/16  . HYSTEROSCOPY  2015  . MULTIPLE EXTRACTIONS WITH ALVEOLOPLASTY Bilateral 08/13/2016   Procedure: MULTIPLE EXTRACTIONS;  Surgeon: Diona Browner, DDS;  Location: [facility];  Service: Oral Surgery;  Laterality: Bilateral;  . PACEMAKER INSERTION  2008  . THYROIDECTOMY Bilateral 04/25/2015   Procedure: THYROIDECTOMY;  Surgeon: Melida Quitter, MD;  Location: [facility];  Service: ENT;   Laterality: Bilateral;  TOTAL THYROIDECTOMY  . TUMOR REMOVAL  2013   UTERUS    There were no vitals filed for this visit.   Subjective Assessment - 06/01/19 1402    Subjective  COVID-19 screening performed upon arrival.Patient arrives to physical therapy with reports of left knee pain and reports of knee giving way that began after painting her house for two days. Patient reports pain progressed over the past 5 months to the point where she is unable work secondary to pain with activity and standing. Patient reports ability to perform ADLs and caregiving activities for her 3 grandchildren but with significant pain which requires constant seated rest breaks. Patient reports pain at worst as 10/10 and pain at best as 4-5/10. Patient's goals are to decrease pain, improve movement, improve standing tolerance, and improve ability to perform home and caregiving activities for her three grandchildren.    Pertinent History  HTN, Pacemaker, COPD, history of CVA, nonischemic cardiomyopathy    Limitations  Sitting;Standing;Walking;House hold activities    How long can you stand comfortably?  short periods    How long can you walk comfortably?  20-30 mins  Diagnostic tests  x-ray: see imaging tab no bony abnormalities, "tiny joint effusion cannot be excluded"    Patient Stated Goals  get well to go back to work and take care of grandkids.    Currently in Pain?  Yes    Pain Score  6     Pain Location  Knee    Pain Orientation  Left    Pain Descriptors / Indicators  Aching;Shooting    Pain Type  Chronic pain    Pain Onset  More than a month ago    Pain Frequency  Intermittent    Aggravating Factors   "use"    Pain Relieving Factors  "rest"    Effect of Pain on Daily Activities  "any length of time on leg it hurts enought to have to get off it."         [facility] PT Assessment - 06/01/19 0001      Assessment   Medical Diagnosis  Chronic pain of left knee    Referring Provider (PT)  Baruch Gouty,  FNP    Onset Date/Surgical Date  --   November 2020   Next MD Visit  8 weeks ~April 2021    Prior Therapy  no      Precautions   Precautions  Other (comment)    Precaution Comments  pain free ROM and strengthening      Restrictions   Weight Bearing Restrictions  No      Balance Screen   Has the patient fallen in the past 6 months  No    Has the patient had a decrease in activity level because of a fear of falling?   Yes    Is the patient reluctant to leave their home because of a fear of falling?   No      Home Social worker  Private residence    Living Arrangements  Other relatives;Non-relatives/Friends   3 grandchildren   Type of Home  House      Prior Function   Level of Independence  Independent with basic ADLs;Needs assistance with homemaking      Observation/Other Assessments-Edema    Edema  Circumferential      Circumferential Edema   Circumferential - Right  47 cm at midpatella    Circumferential - Left   45.5 cm at midpatella      ROM / Strength   AROM / PROM / Strength  AROM;Strength      AROM   AROM Assessment Site  Knee    Right/Left Knee  Left    Left Knee Extension  10    Left Knee Flexion  93      Strength   Strength Assessment Site  Hip;Knee    Right/Left Hip  Left    Left Hip Flexion  3+/5    Left Hip Extension  3/5    Left Hip ABduction  3-/5    Right/Left Knee  Left    Left Knee Flexion  3-/5    Left Knee Extension  3/5      Palpation   Patella mobility  decreased patella mobility in all planes    Palpation comment  very tender to left knee joint line, apex of patella, and medial aspect of the left knee      Special Tests    Special Tests  Knee Special Tests    Knee Special tests   Patellofemoral Grind Test (Clarke's Sign)      Patellofemoral Lynne Leader  test (Clark's Sign)   Findings  Postive    Side   Left      Transfers   Comments  slow, increased right weightshift to right to limit left weightbearing       Ambulation/Gait   Gait Pattern  Step-to pattern;Step-through pattern;Decreased stride length;Decreased stance time - left;Decreased step length - right;Decreased hip/knee flexion - left;Decreased weight shift to left;Antalgic                Objective measurements completed on examination: See above findings.              PT Education - 06/01/19 1407    Education Details  quad sets, hamstring sets, pillow squeeze, hip abduction isometric    Person(s) Educated  Patient    Methods  Explanation;Demonstration;Handout    Comprehension  Verbalized understanding;Returned demonstration       PT Short Term Goals - 06/01/19 1417      PT SHORT TERM GOAL #1   Title  Patient will be independent with initial HEP    Baseline  no knowledge of HEP    Time  3    Period  Weeks    Status  New      PT SHORT TERM GOAL #2   Title  Patient will verbalize proper edema management techniques to reduce left knee edema.    Baseline  no knowledge of proper edema management.    Time  3    Period  Weeks    Status  New        PT Long Term Goals - 06/01/19 1419      PT LONG TERM GOAL #1   Title  Patient will be independent with advanced HEP    Baseline  no knowledge of HEP    Time  8    Period  Weeks    Status  New      PT LONG TERM GOAL #2   Title  Patient will demonstrate 5 degrees or less of left knee extension AROM to improve gait mechanics.    Baseline  10-93 degrees of left knee AROM    Time  8    Period  Weeks    Status  New      PT LONG TERM GOAL #3   Title  Patient will demonstrate 115+ degrees of left knee flexion AROM to improve ability to peform functional tasks.    Baseline  10-93 degrees of left knee AROM    Time  8    Period  Weeks    Status  New      PT LONG TERM GOAL #4   Title  Patient will report ability to perform ADLs and caregiving activities with left knee pain less than or equal to 3/10.    Baseline  Pain with ADLs and caregiving activities ranges  from 4-10/10    Time  8    Period  Weeks    Status  New             Plan - 06/01/19 1412    Clinical Impression Statement  Patient is a 49 year old female who presents to physical therapy with of left knee pain, decreased left knee ROM, and decreased left knee MMT that progressively worsened over the past five months. Patient is very tender to left knee joint line, apex of the patella, and to medial knee. Patient ambulates with an antalgic gait pattern, decreased left stance time, decreased left weight shift, and decreased  right step length. Patient and PT discussed plan of care, HEP and proper edema management techniques. Patient reported understanding. Patient expressed importance of HEP as she is only able to attend PT 1x per week as she does not have anyone to watch her grandchildren. Patient would benefit from skilled physical therapy to address deficits and address patient's goals.    Personal Factors and Comorbidities  Comorbidity 3+    Comorbidities  HTN, Pacemaker, COPD, history of CVA, nonischemic cardiomyopathy    Examination-Activity Limitations  Stand;Stairs;Squat;Transfers;Locomotion Level    Stability/Clinical Decision Making  Stable/Uncomplicated    Clinical Decision Making  Low    Rehab Potential  Fair    PT Frequency  1x / week    PT Duration  8 weeks    PT Treatment/Interventions  ADLs/Self Care Home Management;Cryotherapy;Moist Heat;Gait training;Stair training;Functional mobility training;Therapeutic activities;Therapeutic exercise;Balance training;Neuromuscular re-education;Manual techniques;Passive range of motion;Patient/family education;Vasopneumatic Device;Taping    PT Next Visit Plan  nustep, pain free strengthening and ROM, hip strengthening. No e-stim pt has pacemaker.    PT Home Exercise Plan  see patient education section    Consulted and Agree with Plan of Care  Patient       Patient will benefit from skilled therapeutic intervention in order to improve  the following deficits and impairments:  Abnormal gait, Decreased activity tolerance, Decreased strength, Difficulty walking, Decreased range of motion, Pain  Visit Diagnosis: Chronic pain of left knee - Plan: PT plan of care cert/re-cert  Stiffness of left knee, not elsewhere classified - Plan: PT plan of care cert/re-cert  Difficulty in walking, not elsewhere classified - Plan: PT plan of care cert/re-cert     Problem List Patient Active Problem List   Diagnosis Date Noted  . Paresthesia 02/01/2019  . GERD (gastroesophageal reflux disease) 09/21/2018  . Other fatigue 06/09/2017  . Chronic atrial fibrillation ([place]) 06/09/2017  . Other specified hypothyroidism 06/09/2017  . Vitamin D deficiency 06/09/2017  . B12 nutritional deficiency 06/09/2017  . Hyperglycemia 06/09/2017  . Chronic obstructive pulmonary disease ([place]) 06/09/2017  . Pacemaker 05/27/2016  . Acute low back pain 04/09/2016  . Solitary pulmonary nodule 02/05/2016  . History of thyroid cancer 01/08/2016  . Tobacco abuse 05/12/2015  . History of colonic polyps 05/12/2015  . Hypocalcemia 04/28/2015  . H/O total thyroidectomy 04/28/2015  . Hypomagnesemia 04/28/2015  . Epigastric abdominal pain 04/17/2015  . Hyperthyroidism 11/12/2014  . Nonischemic cardiomyopathy ([place]) 11/12/2014  . Mixed hyperlipidemia 11/12/2014  . 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/11/2014  . Sick sinus syndrome ([place]) 11/11/2014  . Anxiety state 11/11/2014  . History of CVA (cerebrovascular accident) 11/11/2014  . Essential hypertension 05/28/2014    Gabriela Eves, PT, DPT 06/01/2019, 2:47 PM  [facility] [address] [place], [place], [address] Phone: [phone]   Fax:  [phone]  Name: Alison Wilkinson MRN: [id number] Date of Birth: Jan 06, 1971

## 2019-06-11 DIAGNOSIS — Z950 Presence of cardiac pacemaker: Secondary | ICD-10-CM | POA: Diagnosis not present

## 2019-06-11 DIAGNOSIS — Z45010 Encounter for checking and testing of cardiac pacemaker pulse generator [battery]: Secondary | ICD-10-CM | POA: Diagnosis not present

## 2019-06-11 DIAGNOSIS — I495 Sick sinus syndrome: Secondary | ICD-10-CM | POA: Diagnosis not present

## 2019-06-15 ENCOUNTER — Ambulatory Visit: Payer: Medicaid Other | Admitting: Physical Therapy

## 2019-06-22 DIAGNOSIS — M25562 Pain in left knee: Secondary | ICD-10-CM | POA: Diagnosis not present

## 2019-06-22 DIAGNOSIS — Z6841 Body Mass Index (BMI) 40.0 and over, adult: Secondary | ICD-10-CM | POA: Diagnosis not present

## 2019-06-29 DIAGNOSIS — M5432 Sciatica, left side: Secondary | ICD-10-CM | POA: Diagnosis not present

## 2019-07-01 ENCOUNTER — Other Ambulatory Visit: Payer: Self-pay

## 2019-07-01 ENCOUNTER — Observation Stay (HOSPITAL_COMMUNITY)
Admission: EM | Admit: 2019-07-01 | Discharge: 2019-07-02 | DRG: 065 | Disposition: A | Discharge status: Home / Self Care | Payer: Medicaid Other | Attending: Student | Admitting: Student

## 2019-07-01 ENCOUNTER — Encounter (HOSPITAL_COMMUNITY): Payer: Self-pay | Admitting: *Deleted

## 2019-07-01 ENCOUNTER — Emergency Department (HOSPITAL_COMMUNITY): Payer: Medicaid Other

## 2019-07-01 DIAGNOSIS — I480 Paroxysmal atrial fibrillation: Secondary | ICD-10-CM | POA: Diagnosis present

## 2019-07-01 DIAGNOSIS — F419 Anxiety disorder, unspecified: Secondary | ICD-10-CM | POA: Diagnosis not present

## 2019-07-01 DIAGNOSIS — Z79891 Long term (current) use of opiate analgesic: Secondary | ICD-10-CM

## 2019-07-01 DIAGNOSIS — Z79890 Hormone replacement therapy (postmenopausal): Secondary | ICD-10-CM

## 2019-07-01 DIAGNOSIS — Z86010 Personal history of colonic polyps: Secondary | ICD-10-CM | POA: Diagnosis not present

## 2019-07-01 DIAGNOSIS — Z87891 Personal history of nicotine dependence: Secondary | ICD-10-CM

## 2019-07-01 DIAGNOSIS — E785 Hyperlipidemia, unspecified: Secondary | ICD-10-CM

## 2019-07-01 DIAGNOSIS — R201 Hypoesthesia of skin: Secondary | ICD-10-CM | POA: Diagnosis not present

## 2019-07-01 DIAGNOSIS — I639 Cerebral infarction, unspecified: Secondary | ICD-10-CM | POA: Diagnosis not present

## 2019-07-01 DIAGNOSIS — Z85850 Personal history of malignant neoplasm of thyroid: Secondary | ICD-10-CM

## 2019-07-01 DIAGNOSIS — Z818 Family history of other mental and behavioral disorders: Secondary | ICD-10-CM

## 2019-07-01 DIAGNOSIS — I63311 Cerebral infarction due to thrombosis of right middle cerebral artery: Secondary | ICD-10-CM | POA: Diagnosis not present

## 2019-07-01 DIAGNOSIS — I10 Essential (primary) hypertension: Secondary | ICD-10-CM | POA: Diagnosis not present

## 2019-07-01 DIAGNOSIS — G8929 Other chronic pain: Secondary | ICD-10-CM | POA: Diagnosis not present

## 2019-07-01 DIAGNOSIS — Z823 Family history of stroke: Secondary | ICD-10-CM

## 2019-07-01 DIAGNOSIS — Z6841 Body Mass Index (BMI) 40.0 and over, adult: Secondary | ICD-10-CM

## 2019-07-01 DIAGNOSIS — Z87442 Personal history of urinary calculi: Secondary | ICD-10-CM | POA: Diagnosis not present

## 2019-07-01 DIAGNOSIS — Z8249 Family history of ischemic heart disease and other diseases of the circulatory system: Secondary | ICD-10-CM | POA: Diagnosis not present

## 2019-07-01 DIAGNOSIS — J449 Chronic obstructive pulmonary disease, unspecified: Secondary | ICD-10-CM | POA: Diagnosis present

## 2019-07-01 DIAGNOSIS — Z950 Presence of cardiac pacemaker: Secondary | ICD-10-CM | POA: Diagnosis not present

## 2019-07-01 DIAGNOSIS — E039 Hypothyroidism, unspecified: Secondary | ICD-10-CM | POA: Diagnosis present

## 2019-07-01 DIAGNOSIS — R29703 NIHSS score 3: Secondary | ICD-10-CM | POA: Diagnosis present

## 2019-07-01 DIAGNOSIS — I69354 Hemiplegia and hemiparesis following cerebral infarction affecting left non-dominant side: Secondary | ICD-10-CM | POA: Diagnosis not present

## 2019-07-01 DIAGNOSIS — I495 Sick sinus syndrome: Secondary | ICD-10-CM | POA: Diagnosis not present

## 2019-07-01 DIAGNOSIS — K219 Gastro-esophageal reflux disease without esophagitis: Secondary | ICD-10-CM | POA: Diagnosis not present

## 2019-07-01 DIAGNOSIS — E782 Mixed hyperlipidemia: Secondary | ICD-10-CM | POA: Diagnosis present

## 2019-07-01 DIAGNOSIS — R531 Weakness: Secondary | ICD-10-CM

## 2019-07-01 DIAGNOSIS — Z20822 Contact with and (suspected) exposure to covid-19: Secondary | ICD-10-CM | POA: Diagnosis present

## 2019-07-01 DIAGNOSIS — Z7982 Long term (current) use of aspirin: Secondary | ICD-10-CM

## 2019-07-01 DIAGNOSIS — Z888 Allergy status to other drugs, medicaments and biological substances status: Secondary | ICD-10-CM

## 2019-07-01 DIAGNOSIS — R519 Headache, unspecified: Secondary | ICD-10-CM | POA: Diagnosis not present

## 2019-07-01 DIAGNOSIS — Z8673 Personal history of transient ischemic attack (TIA), and cerebral infarction without residual deficits: Secondary | ICD-10-CM

## 2019-07-01 DIAGNOSIS — G4730 Sleep apnea, unspecified: Secondary | ICD-10-CM | POA: Diagnosis not present

## 2019-07-01 DIAGNOSIS — R2990 Unspecified symptoms and signs involving the nervous system: Secondary | ICD-10-CM | POA: Diagnosis not present

## 2019-07-01 DIAGNOSIS — R197 Diarrhea, unspecified: Secondary | ICD-10-CM | POA: Diagnosis present

## 2019-07-01 DIAGNOSIS — Z882 Allergy status to sulfonamides status: Secondary | ICD-10-CM

## 2019-07-01 DIAGNOSIS — Z79899 Other long term (current) drug therapy: Secondary | ICD-10-CM

## 2019-07-01 LAB — CBC WITH DIFFERENTIAL/PLATELET
Abs Immature Granulocytes: 0.03 K/uL (ref 0.00–0.07)
Basophils Absolute: 0 K/uL (ref 0.0–0.1)
Basophils Relative: 0 %
Eosinophils Absolute: 0.1 K/uL (ref 0.0–0.5)
Eosinophils Relative: 1 %
HCT: 42.5 % (ref 36.0–46.0)
Hemoglobin: 13.5 g/dL (ref 12.0–15.0)
Immature Granulocytes: 0 %
Lymphocytes Relative: 37 %
Lymphs Abs: 3.3 K/uL (ref 0.7–4.0)
MCH: 30.5 pg (ref 26.0–34.0)
MCHC: 31.8 g/dL (ref 30.0–36.0)
MCV: 95.9 fL (ref 80.0–100.0)
Monocytes Absolute: 0.8 K/uL (ref 0.1–1.0)
Monocytes Relative: 9 %
Neutro Abs: 4.8 K/uL (ref 1.7–7.7)
Neutrophils Relative %: 53 %
Platelets: 328 K/uL (ref 150–400)
RBC: 4.43 MIL/uL (ref 3.87–5.11)
RDW: 14.9 % (ref 11.5–15.5)
WBC: 9 K/uL (ref 4.0–10.5)
nRBC: 0 % (ref 0.0–0.2)

## 2019-07-01 LAB — RAPID URINE DRUG SCREEN, HOSP PERFORMED
Amphetamines: NOT DETECTED
Barbiturates: NOT DETECTED
Benzodiazepines: NOT DETECTED
Cocaine: NOT DETECTED
Opiates: NOT DETECTED
Tetrahydrocannabinol: NOT DETECTED

## 2019-07-01 LAB — URINALYSIS, ROUTINE W REFLEX MICROSCOPIC
Bilirubin Urine: NEGATIVE
Glucose, UA: NEGATIVE mg/dL
Ketones, ur: NEGATIVE mg/dL
Leukocytes,Ua: NEGATIVE
Nitrite: NEGATIVE
Protein, ur: NEGATIVE mg/dL
Specific Gravity, Urine: 1.005 (ref 1.005–1.030)
pH: 5 (ref 5.0–8.0)

## 2019-07-01 LAB — PROTIME-INR
INR: 0.9 (ref 0.8–1.2)
Prothrombin Time: 12.2 s (ref 11.4–15.2)

## 2019-07-01 LAB — ETHANOL: Alcohol, Ethyl (B): <10 mg/dL

## 2019-07-01 LAB — COMPREHENSIVE METABOLIC PANEL WITH GFR
ALT: 16 U/L (ref 0–44)
AST: 16 U/L (ref 15–41)
Albumin: 4.3 g/dL (ref 3.5–5.0)
Alkaline Phosphatase: 50 U/L (ref 38–126)
Anion gap: 9 (ref 5–15)
BUN: 26 mg/dL — ABNORMAL HIGH (ref 6–20)
CO2: 25 mmol/L (ref 22–32)
Calcium: 8.8 mg/dL — ABNORMAL LOW (ref 8.9–10.3)
Chloride: 105 mmol/L (ref 98–111)
Creatinine, Ser: 1.09 mg/dL — ABNORMAL HIGH (ref 0.44–1.00)
GFR calc Af Amer: >60 mL/min
GFR calc non Af Amer: 60 mL/min — ABNORMAL LOW
Glucose, Bld: 103 mg/dL — ABNORMAL HIGH (ref 70–99)
Potassium: 3.5 mmol/L (ref 3.5–5.1)
Sodium: 139 mmol/L (ref 135–145)
Total Bilirubin: 0.3 mg/dL (ref 0.3–1.2)
Total Protein: 7.8 g/dL (ref 6.5–8.1)

## 2019-07-01 LAB — VITAMIN B12: Vitamin B-12: 187 pg/mL (ref 180–914)

## 2019-07-01 LAB — MAGNESIUM: Magnesium: 2.2 mg/dL (ref 1.7–2.4)

## 2019-07-01 LAB — APTT: aPTT: 25 seconds (ref 24–36)

## 2019-07-01 LAB — CBG MONITORING, ED: Glucose-Capillary: 95 mg/dL (ref 70–99)

## 2019-07-01 LAB — SEDIMENTATION RATE: Sed Rate: 20 mm/hr (ref 0–22)

## 2019-07-01 LAB — TSH: TSH: 2.977 u[IU]/mL (ref 0.350–4.500)

## 2019-07-01 LAB — C-REACTIVE PROTEIN: CRP: 0.5 mg/dL

## 2019-07-01 MED ORDER — STROKE: EARLY STAGES OF RECOVERY BOOK
Freq: Once | Status: AC
  Filled 2019-07-01 (×2): qty 1

## 2019-07-01 MED ORDER — CLONAZEPAM 0.5 MG PO TABS: 0.5000 mg | ORAL_TABLET | Freq: Every day | ORAL | Status: DC | PRN

## 2019-07-01 MED ORDER — ACETAMINOPHEN 650 MG RE SUPP: 650.0000 mg | RECTAL | Status: DC | PRN

## 2019-07-01 MED ORDER — METOPROLOL SUCCINATE ER 25 MG PO TB24
25.0000 mg | ORAL_TABLET | Freq: Every day | ORAL | Status: DC
  Administered 2019-07-02: 25 mg via ORAL
  Filled 2019-07-01: qty 1

## 2019-07-01 MED ORDER — ASPIRIN 300 MG RE SUPP: 300.0000 mg | Freq: Every day | RECTAL | Status: DC

## 2019-07-01 MED ORDER — ACETAMINOPHEN 160 MG/5ML PO SOLN: 650.0000 mg | ORAL | Status: DC | PRN

## 2019-07-01 MED ORDER — GABAPENTIN 100 MG PO CAPS: 100.0000 mg | ORAL_CAPSULE | Freq: Two times a day (BID) | ORAL | Status: DC

## 2019-07-01 MED ORDER — SODIUM CHLORIDE 0.9 % IV SOLN: INTRAVENOUS | Status: AC

## 2019-07-01 MED ORDER — ASPIRIN 81 MG PO CHEW
324.0000 mg | CHEWABLE_TABLET | Freq: Once | ORAL | Status: AC
  Administered 2019-07-01: 324 mg via ORAL
  Filled 2019-07-01: qty 4

## 2019-07-01 MED ORDER — DIPHENHYDRAMINE HCL 12.5 MG/5ML PO ELIX
12.5000 mg | ORAL_SOLUTION | Freq: Four times a day (QID) | ORAL | Status: DC | PRN
  Administered 2019-07-01: 12.5 mg via ORAL
  Filled 2019-07-01: qty 10

## 2019-07-01 MED ORDER — ASPIRIN 325 MG PO TABS
325.0000 mg | ORAL_TABLET | Freq: Every day | ORAL | Status: DC
  Administered 2019-07-02: 09:00:00 325 mg via ORAL
  Filled 2019-07-01: qty 1

## 2019-07-01 MED ORDER — KETOROLAC TROMETHAMINE 30 MG/ML IJ SOLN
30.0000 mg | Freq: Once | INTRAMUSCULAR | Status: AC
  Administered 2019-07-01: 30 mg via INTRAVENOUS
  Filled 2019-07-01: qty 1

## 2019-07-01 MED ORDER — PANTOPRAZOLE SODIUM 40 MG PO TBEC
40.0000 mg | DELAYED_RELEASE_TABLET | Freq: Every day | ORAL | Status: DC
  Administered 2019-07-02: 40 mg via ORAL
  Filled 2019-07-01: qty 1

## 2019-07-01 MED ORDER — ALBUTEROL SULFATE (2.5 MG/3ML) 0.083% IN NEBU: 3.0000 mL | INHALATION_SOLUTION | Freq: Four times a day (QID) | RESPIRATORY_TRACT | Status: DC | PRN

## 2019-07-01 MED ORDER — PROCHLORPERAZINE EDISYLATE 10 MG/2ML IJ SOLN
10.0000 mg | Freq: Four times a day (QID) | INTRAMUSCULAR | Status: DC | PRN
  Administered 2019-07-01 – 2019-07-02 (×2): 10 mg via INTRAVENOUS
  Filled 2019-07-01 (×2): qty 2

## 2019-07-01 MED ORDER — LEVOTHYROXINE SODIUM 112 MCG PO TABS
112.0000 ug | ORAL_TABLET | Freq: Every day | ORAL | Status: DC
  Administered 2019-07-02: 112 ug via ORAL
  Filled 2019-07-01 (×2): qty 1

## 2019-07-01 MED ORDER — SENNOSIDES-DOCUSATE SODIUM 8.6-50 MG PO TABS
1.0000 | ORAL_TABLET | Freq: Every evening | ORAL | Status: DC | PRN
  Filled 2019-07-01: qty 1

## 2019-07-01 MED ORDER — HEPARIN SODIUM (PORCINE) 5000 UNIT/ML IJ SOLN
5000.0000 [IU] | Freq: Three times a day (TID) | INTRAMUSCULAR | Status: DC
  Filled 2019-07-01 (×2): qty 1

## 2019-07-01 MED ORDER — ACETAMINOPHEN 325 MG PO TABS: 650.0000 mg | ORAL_TABLET | ORAL | Status: DC | PRN

## 2019-07-01 NOTE — ED Notes (Signed)
Teleneurologist called for stat consult.

## 2019-07-01 NOTE — ED Notes (Signed)
Tele-neuro completed 

## 2019-07-01 NOTE — ED Provider Notes (Signed)
[facility] EMERGENCY DEPARTMENT Provider Note   CSN: [id number] Arrival date & time: 07/01/19  [id number]     History Chief Complaint  Patient presents with  . Numbness    Alison Wilkinson is a 49 y.o. female.  HPI   This patient is a 49 year old female, she has a history of sick sinus syndrome for which she has had a pacemaker placed, there is a history of potential cardiomyopathy, atrial fibrillation, she is not anticoagulated.  In fact the review of the medications that this patient takes shows that she is on Synthroid, Ativan as needed, Metformin, metoprolol, statin and was recently seen at an urgent care 2 days ago when she was having lower back pain with some leg symptoms on the left.  She was given a steroid shot, Toradol shot and given a prescription for Flexeril.  Incidentally the patient had been seen approximately 1 month ago in the office after having a knee injury on that same side.  Last night the patient states that she developed paresthesias of bilateral hands, she states that she cannot feel her fingertips on either hand, this is been going on for approximately 18 hours.  She denies any pain in the arms and has no difficulty using her hands, no problems with coordination or strength.  She has no difficulty with her speech or her vision although she states that her vision is gradually getting worse over time in a very nonspecific way.  She has not started any new medications, she is not a known diabetic, she has had an excessive thirst for several days stating that since last night she has had approximately 6 bottles of water and cannot quench the extreme thirst and feeling like her mouth is dry.  She has not had a formal evaluation for this other than the urgent care visit several days ago and at that time was not having arm symptoms.  She does report that over the last couple of years she has noticed some intermittent numbness and tingling of her hands and feet similar to today but  more transient.  The patient does report several episodes of diarrhea this morning  Past Medical History:  Diagnosis Date  . A-fib ([place])   . Anxiety   . Atrial tachycardia ([place])   . Cancer ([place])    thyroid  . Cardiomyopathy ([place])   . COPD (chronic obstructive pulmonary disease) ([place])   . Dyspnea   . Dysrhythmia   . GERD (gastroesophageal reflux disease)   . Headache   . High cholesterol   . History of kidney stones   . Hypertension   . Hyperthyroidism   . Hypothyroid   . Mixed hyperlipidemia   . Pacemaker    biotronic  PPM   DR. [place]   . Pacemaker   . Pneumonia    hx  . PONV (postoperative nausea and vomiting)   . Shortness of breath dyspnea    WITH EXERTION   . Sleep apnea    mild case no cpap reccommended  . SSS (sick sinus syndrome) ([place])   . Stroke [facility])    2009   afib  (none in years)  . Syncope     Patient Active Problem List   Diagnosis Date Noted  . Paresthesia 02/01/2019  . GERD (gastroesophageal reflux disease) 09/21/2018  . Other fatigue 06/09/2017  . Chronic atrial fibrillation ([place]) 06/09/2017  . Other specified hypothyroidism 06/09/2017  . Vitamin D deficiency 06/09/2017  . B12 nutritional  deficiency 06/09/2017  . Hyperglycemia 06/09/2017  . Chronic obstructive pulmonary disease ([place]) 06/09/2017  . Pacemaker 05/27/2016  . Acute low back pain 04/09/2016  . Solitary pulmonary nodule 02/05/2016  . History of thyroid cancer 01/08/2016  . Tobacco abuse 05/12/2015  . History of colonic polyps 05/12/2015  . Hypocalcemia 04/28/2015  . H/O total thyroidectomy 04/28/2015  . Hypomagnesemia 04/28/2015  . Epigastric abdominal pain 04/17/2015  . Hyperthyroidism 11/12/2014  . Nonischemic cardiomyopathy ([place]) 11/12/2014  . Mixed hyperlipidemia 11/12/2014  . 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/11/2014  . Sick sinus syndrome ([place]) 11/11/2014  . Anxiety state 11/11/2014  . History of CVA (cerebrovascular accident) 11/11/2014  . Essential hypertension  05/28/2014    Past Surgical History:  Procedure Laterality Date  . CARDIAC CATHETERIZATION     8/16  . HYSTEROSCOPY  2015  . MULTIPLE EXTRACTIONS WITH ALVEOLOPLASTY Bilateral 08/13/2016   Procedure: MULTIPLE EXTRACTIONS;  Surgeon: Diona Browner, DDS;  Location: [place];  Service: Oral Surgery;  Laterality: Bilateral;  . PACEMAKER INSERTION  2008  . THYROIDECTOMY Bilateral 04/25/2015   Procedure: THYROIDECTOMY;  Surgeon: Melida Quitter, MD;  Location: [place];  Service: ENT;  Laterality: Bilateral;  TOTAL THYROIDECTOMY  . TUMOR REMOVAL  2013   UTERUS     OB History    Gravida  1   Para  1   Term  1   Preterm      AB      Living  1     SAB      TAB      Ectopic      Multiple      Live Births              Family History  Problem Relation Age of Onset  . Diabetes Mother   . Hyperlipidemia Mother   . Hypertension Mother   . Depression Mother   . Heart disease Mother   . Bipolar disorder Mother   . Liver disease Mother   . Sleep apnea Mother   . Obesity Mother   . Hypertension Father   . Stroke Father   . Heart disease Father   . Thyroid disease Father   . Alcoholism Father   . Obesity Father   . Hyperlipidemia Sister   . Hypertension Sister   . Thyroid disease Sister   . Hypertension Daughter   . Mental illness Daughter     Social History   Tobacco Use  . Smoking status: Former Smoker    Packs/day: 0.50    Years: 35.00    Pack years: 17.50    Types: Cigarettes    Quit date: 12/20/2016    Years since quitting: 2.5  . Smokeless tobacco: Former Network engineer Use Topics  . Alcohol use: No    Comment: NONE IN 6 YEARS  . Drug use: No    Types: Marijuana    Comment: OVER 1 YEAR    Home Medications Prior to Admission medications   Medication Sig Start Date End Date Taking? Authorizing Provider  albuterol (PROVENTIL HFA;VENTOLIN HFA) 108 (90 Base) MCG/ACT inhaler Inhale 1-2 puffs into the lungs every 6 (six) hours as needed for wheezing or  shortness of breath.    [provider]  albuterol (PROVENTIL) (2.5 MG/3ML) 0.083% nebulizer solution Take 3 mLs (2.5 mg total) by nebulization every 6 (six) hours as needed for wheezing or shortness of breath. 04/19/16   Eustaquio Maize, MD  aspirin EC 81 MG tablet Take 81 mg by mouth daily.  [provider]  clonazePAM (KLONOPIN) 0.5 MG tablet Take by mouth.    [provider]  ergocalciferol (VITAMIN D2) 1.25 MG (50000 UT) capsule Take 1 capsule (50,000 Units total) by mouth once a week for 30 doses. 02/05/19 08/28/19  Baruch Gouty, FNP  HYDROcodone-acetaminophen (NORCO/VICODIN) 5-325 MG tablet Take 1 tablet by mouth every 6 (six) hours as needed for moderate pain. 05/30/19   Baruch Gouty, FNP  levothyroxine (SYNTHROID) 112 MCG tablet Take 112 mcg by mouth daily before breakfast.    [provider]  metoprolol succinate (TOPROL-XL) 25 MG 24 hr tablet Take 25 mg by mouth daily.     [provider]  omeprazole (PRILOSEC) 40 MG capsule Take 40 mg by mouth 2 (two) times daily.    [provider]    Allergies    Carvedilol, Lisinopril, and Sulfa antibiotics  Review of Systems   Review of Systems  Gastrointestinal: Positive for diarrhea.  Neurological: Positive for numbness.  All other systems reviewed and are negative.   Physical Exam Updated Vital Signs BP (!) 157/81 (BP Location: Right Arm)   Pulse (!) 59   Temp 98 F (36.7 C) (Oral)   Resp 13   Ht 1.676 m (5\' 6" )   Wt 119.3 kg   LMP 05/28/2019   SpO2 100%   BMI 42.45 kg/m   Physical Exam Vitals and nursing note reviewed.  Constitutional:      General: She is not in acute distress.    Appearance: She is well-developed.  HENT:     Head: Normocephalic and atraumatic.     Mouth/Throat:     Pharynx: No oropharyngeal exudate.  Eyes:     General: No scleral icterus.       Right eye: No discharge.        Left eye: No discharge.     Conjunctiva/sclera: Conjunctivae normal.      Pupils: Pupils are equal, round, and reactive to light.  Neck:     Thyroid: No thyromegaly.     Vascular: No JVD.  Cardiovascular:     Rate and Rhythm: Normal rate and regular rhythm.     Heart sounds: Normal heart sounds. No murmur. No friction rub. No gallop.   Pulmonary:     Effort: Pulmonary effort is normal. No respiratory distress.     Breath sounds: Normal breath sounds. No wheezing or rales.  Abdominal:     General: Bowel sounds are normal. There is no distension.     Palpations: Abdomen is soft. There is no mass.     Tenderness: There is no abdominal tenderness.  Musculoskeletal:        General: No tenderness. Normal range of motion.     Cervical back: Normal range of motion and neck supple.  Lymphadenopathy:     Cervical: No cervical adenopathy.  Skin:    General: Skin is warm and dry.     Findings: No erythema or rash.  Neurological:     Mental Status: She is alert.     Coordination: Coordination normal.     Comments: The patient has totally normal speech and coordination, there is no pronator drift, cranial nerves III through XII are totally normal including facial sensation.  Her peripheral visual fields are intact, extraocular movements are intact and her ability to do finger-nose-finger is perfect.  There is no dysmetria, no tremor, no weakness of the bilateral upper extremities at the major muscle groups including the grips.  She does have  decreased sensation to her fingers bilaterally but this does not involve the palms.  Her lower extremities have normal strength in the right lower extremity has totally normal sensation.  The left lower extremity has some decrease sensation laterally in the thigh as well as below the knee in a stocking glove fashion until you get to the foot, it is numb on the base of the foot, over both ankles but not in the first webspace.  She has no weakness at the ankles or the knee.  Psychiatric:        Behavior: Behavior normal.     ED  Results / Procedures / Treatments   Labs (all labs ordered are listed, but only abnormal results are displayed) Labs Reviewed  COMPREHENSIVE METABOLIC PANEL - Abnormal; Notable for the following components:      Result Value   Glucose, Bld 103 (*)    BUN 26 (*)    Creatinine, Ser 1.09 (*)    Calcium 8.8 (*)    GFR calc non Af Amer 60 (*)    All other components within normal limits  SARS CORONAVIRUS 2 (TAT 6-24 HRS)  MAGNESIUM  CBC WITH DIFFERENTIAL/PLATELET  TSH  PROTIME-INR  APTT  VITAMIN B12  ETHANOL  RAPID URINE DRUG SCREEN, HOSP PERFORMED  URINALYSIS, ROUTINE W REFLEX MICROSCOPIC  CBG MONITORING, ED  POC URINE PREG, ED  I-STAT CHEM 8, ED    EKG EKG Interpretation  Date/Time:  Sunday July 01 2019 10:14:25 EDT Ventricular Rate:  73 PR Interval:    QRS Duration: 102 QT Interval:  408 QTC Calculation: 450 R Axis:   43 Text Interpretation: Normal ECG Normal sinus rhythm Confirmed by Noemi Chapel [phone]) on 07/01/2019 10:25:27 AM   Radiology CT Head Wo Contrast  Result Date: 07/01/2019 CLINICAL DATA:  Numbness and tingling in hands and feet for 2 days. Extreme thirst and headache. EXAM: CT HEAD WITHOUT CONTRAST TECHNIQUE: Contiguous axial images were obtained from the base of the skull through the vertex without intravenous contrast. COMPARISON:  03/24/2014 FINDINGS: Brain: The ventricles are normal in size and configuration. No extra-axial fluid collections are identified. The gray-white differentiation is maintained. No CT findings for acute hemispheric infarction or intracranial hemorrhage. No mass lesions. The brainstem and cerebellum are normal. Vascular: No hyperdense vessels or obvious aneurysm. Skull: No acute skull fracture. No bone lesion. Sinuses/Orbits: The paranasal sinuses and mastoid air cells are clear. The globes are intact. Other: No scalp lesions, laceration or hematoma. IMPRESSION: Normal head CT. Electronically Signed   By: Marijo Sanes M.D.   On:  07/01/2019 11:24    Procedures Procedures (including critical care time)  Medications Ordered in ED Medications  aspirin chewable tablet 324 mg (has no administration in time range)    ED Course  I have reviewed the triage vital signs and the nursing notes.  Pertinent labs & imaging results that were available during my care of the patient were reviewed by me and considered in my medical decision making (see chart for details).    MDM Rules/Calculators/A&P                      This patient does not have any lateralizing symptoms, she has some numbness of the left leg but bilateral finger numbness and paresthesias, with her excessive thirst I would consider that there could be a metabolic abnormality such as hypokalemia, hyponatremia, would also consider possible demyelinating disease though that seems less likely.  Her vital signs are  totally reassuring, she is not tachycardic or febrile, she is not nauseated or vomiting.  She does report several episodes of diarrhea this morning  I will proceed with a CT scan of the brain, labs including potassium sodium and a metabolic panel as well as magnesium and blood counts.  We will check a TSH and a B12 left review of this patient's medical history shows that she had a thyroid panel that was done several months ago, her TSH and T4 were normal at that time.  A magnesium and a metabolic panel were also normal at that time.  A B12 level from 2 years ago was on the low side of normal.  I discussed the case with the Neurologist - by telehealth - they recommend that the pt should be admitted for stroke w/u due to some slight drift in their exam(on the left),   CT neg for acute findings, -I have ordered and personally appreciated and interpreted the images, no signs of hemorrhage  Labs are overall unremarkable with no hypoglycemia, no anemia or leukocytosis and normal electrolytes  We will discuss with the hospitalist for admission Discussed with Dr.  Dyann Kief of the hospitalist service, he will coordinate admission, unfortunately the patient has a pacemaker which would prevent MRI  Final Clinical Impression(s) / ED Diagnoses Final diagnoses:  Acute ischemic stroke [facility])    Rx / DC Orders ED Discharge Orders    None       Noemi Chapel, MD 07/01/19 1300

## 2019-07-01 NOTE — ED Triage Notes (Signed)
Patient presents to the ED with numbness, tingling in feet, fingers that began 2 days ago.  Patient reports extreme thirst and a headache.  Patient has a pacemaker.  Patient had been seen Friday at urgent care for same symptoms and had been prescribed prednisone of which she has not taken yet.

## 2019-07-01 NOTE — H&P (Signed)
History and Physical    Alison Wilkinson [id number] DOB: 15-Jul-1970 DOA: 07/01/2019  Referring MD/NP/PA: Dr. Sabra Heck. PCP: Baruch Gouty, FNP  Patient coming from: Home  Chief Complaint: Tingling/numbness and left-sided weakness.  HPI: Alison Wilkinson is a 49 y.o. female with past medical history significant for atrial fibrillation (chronically on aspirin), hypertension, hyperlipidemia, gastroesophageal disease, COPD, hypothyroidism, morbid obesity and anxiety; who presented to the emergency department with complaints of tingling/numbness and left-sided weakness.  Last seen normal 2-3 days.  Patient reports symptoms to fail to improve and decided to come to the emergency department for further evaluation.  She denies chest pain, palpitations, nausea, vomiting, coughing, headaches, blurred vision, dysuria, hematuria, fevers, melena, hematochezia or any other complaints.  In the emergency department work-up has demonstrated negative CT scan and fairly benign blood work.  EKG without signs of acute ischemic changes or arrhythmia.  Teleneurology was consulted and given patient risk factors and history recommended admission for a stroke work-up.  TRH has been consulted to facilitate further evaluation/management for her conditions.  Past Medical/Surgical History: Past Medical History:  Diagnosis Date  . A-fib ([place])   . Anxiety   . Atrial tachycardia ([place])   . Cancer ([place])    thyroid  . Cardiomyopathy ([place])   . COPD (chronic obstructive pulmonary disease) ([place])   . Dyspnea   . Dysrhythmia   . GERD (gastroesophageal reflux disease)   . Headache   . High cholesterol   . History of kidney stones   . Hypertension   . Hyperthyroidism   . Hypothyroid   . Mixed hyperlipidemia   . Pacemaker    biotronic  PPM   DR. Peterson   . Pacemaker   . Pneumonia    hx  . PONV (postoperative nausea and vomiting)   . Shortness of breath dyspnea    WITH EXERTION   . Sleep apnea    mild  case no cpap reccommended  . SSS (sick sinus syndrome) ([place])   . Stroke [facility])    2009   afib  (none in years)  . Syncope     Past Surgical History:  Procedure Laterality Date  . CARDIAC CATHETERIZATION     8/16  . HYSTEROSCOPY  2015  . MULTIPLE EXTRACTIONS WITH ALVEOLOPLASTY Bilateral 08/13/2016   Procedure: MULTIPLE EXTRACTIONS;  Surgeon: Diona Browner, DDS;  Location: [place];  Service: Oral Surgery;  Laterality: Bilateral;  . PACEMAKER INSERTION  2008  . THYROIDECTOMY Bilateral 04/25/2015   Procedure: THYROIDECTOMY;  Surgeon: Melida Quitter, MD;  Location: [place];  Service: ENT;  Laterality: Bilateral;  TOTAL THYROIDECTOMY  . TUMOR REMOVAL  2013   UTERUS    Social History:  reports that she quit smoking about 2 years ago. Her smoking use included cigarettes. She has a 17.50 pack-year smoking history. She has quit using smokeless tobacco. She reports that she does not drink alcohol or use drugs.  Allergies: Allergies  Allergen Reactions  . Carvedilol Palpitations and Shortness Of Breath    Per pt report Per pt report Per pt report Per pt report Per pt report  . Lisinopril Shortness Of Breath  . Sulfa Antibiotics Nausea Only, Other (See Comments) and Nausea And Vomiting    Headache  Headache  Headache  Headache  Headache      Family History:  Family History  Problem Relation Age of Onset  . Diabetes Mother   . Hyperlipidemia Mother   . Hypertension Mother   .  Depression Mother   . Heart disease Mother   . Bipolar disorder Mother   . Liver disease Mother   . Sleep apnea Mother   . Obesity Mother   . Hypertension Father   . Stroke Father   . Heart disease Father   . Thyroid disease Father   . Alcoholism Father   . Obesity Father   . Hyperlipidemia Sister   . Hypertension Sister   . Thyroid disease Sister   . Hypertension Daughter   . Mental illness Daughter     Prior to Admission medications   Medication Sig Start Date End Date Taking? Authorizing Provider   acetaminophen (TYLENOL) 500 MG tablet Take 1,000 mg by mouth every 6 (six) hours as needed.   Yes [provider]  albuterol (PROVENTIL HFA;VENTOLIN HFA) 108 (90 Base) MCG/ACT inhaler Inhale 1-2 puffs into the lungs every 6 (six) hours as needed for wheezing or shortness of breath.   Yes [provider]  albuterol (PROVENTIL) (2.5 MG/3ML) 0.083% nebulizer solution Take 3 mLs (2.5 mg total) by nebulization every 6 (six) hours as needed for wheezing or shortness of breath. 04/19/16  Yes Eustaquio Maize, MD  aspirin EC 81 MG tablet Take 81 mg by mouth daily.   Yes [provider]  ergocalciferol (VITAMIN D2) 1.25 MG (50000 UT) capsule Take 1 capsule (50,000 Units total) by mouth once a week for 30 doses. 02/05/19 08/28/19 Yes Rakes, Connye Burkitt, FNP  levothyroxine (SYNTHROID) 112 MCG tablet Take 112 mcg by mouth daily before breakfast.   Yes [provider]  metoprolol succinate (TOPROL-XL) 25 MG 24 hr tablet Take 25 mg by mouth daily.    Yes [provider]  omeprazole (PRILOSEC) 40 MG capsule Take 40 mg by mouth 2 (two) times daily.   Yes [provider]  clonazePAM (KLONOPIN) 0.5 MG tablet Take 0.5 mg by mouth daily as needed for anxiety.     [provider]  HYDROcodone-acetaminophen (NORCO/VICODIN) 5-325 MG tablet Take 1 tablet by mouth every 6 (six) hours as needed for moderate pain. 05/30/19   Baruch Gouty, FNP    Review of Systems:  Negative except as otherwise mentioned in HPI.  Physical Exam: Vitals:   07/01/19 1430 07/01/19 1432 07/01/19 1500 07/01/19 1530  BP:  128/81 122/72 115/77  Pulse: 64 64 60 64  Resp: 16 15 13 15   Temp:  98 F (36.7 C)    TempSrc:  Oral    SpO2: 98% 100% 97% 98%  Weight:      Height:        Constitutional: NAD, calm, comfortable; complaining of left-sided weakness, numbness and paresthesia. Eyes: PERRL, lids and conjunctivae normal, no icterus, no nystagmus. ENMT: Mucous membranes are moist.  Posterior pharynx clear of any exudate or lesions. Neck: normal, supple, no masses, no thyromegaly, no JVD. Respiratory: clear to auscultation bilaterally, no wheezing, no crackles. Normal respiratory effort. No accessory muscle use.  Cardiovascular: Regular rate and rhythm, no murmurs / rubs / gallops. Trace to1+ edema in her lower extremity (per patient description unchanged from baseline). 2+ pedal pulses. No carotid bruits.  Abdomen: Obese, no tenderness, no masses palpated. No hepatosplenomegaly. Bowel sounds positive.  Musculoskeletal: no clubbing / cyanosis. No joint deformity upper and lower extremities. Good ROM, no contractures. Normal muscle tone.  Skin: no rashes, lesions, ulcers. No induration Neurologic: CN 2-12 grossly intact. Sensation intact. Strength 4/5 in upper and lower extremity on the left side; 5 out of 5 muscle  strength on the right side.  Some weakness in her hand grip appreciated on examination bilaterally. Psychiatric: Normal judgment and insight. Alert and oriented x 3. Normal mood.    Labs on Admission: I have personally reviewed the following labs and imaging studies  CBC: Recent Labs  Lab 07/01/19 1034  WBC 9.0  NEUTROABS 4.8  HGB 13.5  HCT 42.5  MCV 95.9  PLT [id number]   Basic Metabolic Panel: Recent Labs  Lab 07/01/19 1034  NA 139  K 3.5  CL 105  CO2 25  GLUCOSE 103*  BUN 26*  CREATININE 1.09*  CALCIUM 8.8*  MG 2.2   GFR: Estimated Creatinine Clearance: 83 mL/min (A) (by C-G formula based on SCr of 1.09 mg/dL (H)).   Liver Function Tests: Recent Labs  Lab 07/01/19 1034  AST 16  ALT 16  ALKPHOS 50  BILITOT 0.3  PROT 7.8  ALBUMIN 4.3   Coagulation Profile: Recent Labs  Lab 07/01/19 1034  INR 0.9   CBG: Recent Labs  Lab 07/01/19 1009  GLUCAP 95   Thyroid Function Tests: Recent Labs    07/01/19 1034  TSH 2.977   Anemia Panel: Recent Labs    07/01/19 1037  VITAMINB12 187   Urine analysis:    Component Value Date/Time    COLORURINE STRAW (A) 07/01/2019 1231   APPEARANCEUR CLEAR 07/01/2019 1231   LABSPEC 1.005 07/01/2019 1231   PHURINE 5.0 07/01/2019 1231   GLUCOSEU NEGATIVE 07/01/2019 1231   HGBUR MODERATE (A) 07/01/2019 1231   BILIRUBINUR NEGATIVE 07/01/2019 [place] 07/01/2019 1231   PROTEINUR NEGATIVE 07/01/2019 1231   UROBILINOGEN 1.0 10/23/2014 2142   NITRITE NEGATIVE 07/01/2019 1231   LEUKOCYTESUR NEGATIVE 07/01/2019 1231    Radiological Exams on Admission: CT Head Wo Contrast  Result Date: 07/01/2019 CLINICAL DATA:  Numbness and tingling in hands and feet for 2 days. Extreme thirst and headache. EXAM: CT HEAD WITHOUT CONTRAST TECHNIQUE: Contiguous axial images were obtained from the base of the skull through the vertex without intravenous contrast. COMPARISON:  03/24/2014 FINDINGS: Brain: The ventricles are normal in size and configuration. No extra-axial fluid collections are identified. The gray-white differentiation is maintained. No CT findings for acute hemispheric infarction or intracranial hemorrhage. No mass lesions. The brainstem and cerebellum are normal. Vascular: No hyperdense vessels or obvious aneurysm. Skull: No acute skull fracture. No bone lesion. Sinuses/Orbits: The paranasal sinuses and mastoid air cells are clear. The globes are intact. Other: No scalp lesions, laceration or hematoma. IMPRESSION: Normal head CT. Electronically Signed   By: Marijo Sanes M.D.   On: 07/01/2019 11:24    EKG: Independently reviewed.  Normal sinus rhythm, normal QT, normal axis, no acute ischemic changes appreciated.  Assessment/Plan 1-strokelike symptoms: Left-sided weakness, also with tingling/numbness and paresthesias affecting her hands and feet. -Patient with risk factors including hypertension, hyperlipidemia, obesity, paroxysmal atrial fibrillation and previous history of smoking. -EKG and telemetry evaluation in the ED demonstrated sinus rhythm currently -CT head was  negative. -Telemetry neurology recommended admission to complete a stroke work-up including MRI/MRA (if pacemaker compatible), carotid Dopplers and 2D echo. -Will also need in-house neurology evaluation. -TSH and B12 within normal limits. -PT/OT and SPL has been ordered. -She may have demyelinating process or radicular neuropathy for an impinged nerve, as potential differential diagnosis.  Further work-up to be decided by neurology service. -Empirical usage of Neurontin 100 mg twice daily has been ordered. -Will check A1c and lipid panel.  2-hypertension: -Appears to be stable and  well-controlled -Continue beta-blocker -Allowing permissive hypertension while acutely ruling out ischemic process.  3-morbid obesity -Body mass index is 42.45 kg/m. -Low calorie diet, portion control and increase physical activity discussed with patient.  4-prior history of tobacco abuse -Patient reports being a smoking free for almost a year now -She has been congratulated and encouraged to keep herself smoking free at this time.  5-paroxysmal atrial fibrillation -Chronic -Status post pacemaker implantation -She follows with Dr. Jac Canavan at [facility]; office closed today but will require to be reach out to assure pacemaker is compatible with MRIs to further facilitate work-up. -Continue beta-blocker -Continue full dose aspirin for now.  6-GERD -Continue PPI  7-anxiety -Continue as needed.  8-hypothyroidism -TSH within normal limits -Continue Synthroid.  9-history of COPD -Appears to be stable and well-controlled currently -No shortness of breath or wheezing -Continue as needed bronchodilators.   DVT prophylaxis: Heparin. Code Status: Full code Family Communication: No family at bedside. Disposition Plan: Transfer to [facility] for further evaluation by neurology and to complete stroke work-up.  Anticipate discharge back home once work-up completed. Consults called: Patient will  require neurology consultation when she arrived to [facility]. Admission status: Telemetry bed, length of stay less than 2 midnights; observation status.   Time Spent: 70 minutes   Barton Dubois MD [facility] Pager [phone]   07/01/2019, 4:22 PM

## 2019-07-01 NOTE — ED Notes (Signed)
Spoke with Ruby at [facility] to set up transportation at this time.  

## 2019-07-01 NOTE — Consult Note (Signed)
[facility] TeleNeurology Consult Services  Stat Consult  Date of Service:   07/01/2019 11:40:37  Impression:     .  I63.311 - Cerebrovascular accident (CVA) due to thrombosis of right middle cerebral artery (HCCC)  Comments/Sign-Out: 49 yr old with hx of atrial fib, HTN, pacemaker, cardiomyopathy, presents with L sided numbness, tingling, L arm, arm, and has L drift arm, and leg. NIH 3. Suspect R MCA CVA, other Diff Dx radicular symptoms. Symptoms for 3 days, therefore not in window for alteplase, or intervention. Would recommend ASA, CVA admission, CVA orders, d/w ER all of the above.  CT HEAD: Showed No Acute Hemorrhage or Acute Core Infarct  Metrics: [employer] Notification Time: 07/01/2019 11:38:39 Stamp Time: 07/01/2019 11:40:37 Callback Response Time: 07/01/2019 11:42:11  Our recommendations are outlined below.  Recommendations:     .  Antiplatelet Therapy  Imaging Studies:     .  MRI Head with and Without Contrast     .  MRA Head Without Contrast     .  MRA Neck with and Without Contrast  Therapies:     .  Physical Therapy, Occupational Therapy, Speech Therapy Assessment When Applicable  Other WorkUp:     .  Infectious/metabolic workup per primary team  Disposition: Neurology Follow Up Recommended  Sign Out:     .  Discussed with Emergency Department Provider  ----------------------------------------------------------------------------------------------------  Chief Complaint: numbness  History of Present Illness: Patient is a 49 year old Female.  49 yr old woman, with cardiomyopathy, pacemaker, atrial fib, presents with numbness, tingling for 3 days, started with L foot, and now she has numbness, tingling, L arm, L foot, and both arms. She has headache, no fever, chills, she has generalized weakness, she has blurry vision, no speech changes. No falls. She is on HTN meds. She had CVA in 2009.   Past Medical History:     .  Hypertension     . Hyperlipidemia      Examination: BP(131/78), Pulse(68), Blood Glucose(103) 1A: Level of Consciousness - Alert; keenly responsive + 0 1B: Ask Month and Age - Both Questions Right + 0 1C: Blink Eyes & Squeeze Hands - Performs Both Tasks + 0 2: Test Horizontal Extraocular Movements - Normal + 0 3: Test Visual Fields - No Visual Loss + 0 4: Test Facial Palsy (Use Grimace if Obtunded) - Normal symmetry + 0 5A: Test Left Arm Motor Drift - Drift, but doesn't hit bed + 1 5B: Test Right Arm Motor Drift - No Drift for 10 Seconds + 0 6A: Test Left Leg Motor Drift - Drift, but doesn't hit bed + 1 6B: Test Right Leg Motor Drift - No Drift for 5 Seconds + 0 7: Test Limb Ataxia (FNF/Heel-Shin) - No Ataxia + 0 8: Test Sensation - Mild-Moderate Loss: Less Sharp/More Dull + 1 9: Test Language/Aphasia - Normal; No aphasia + 0 10: Test Dysarthria - Normal + 0 11: Test Extinction/Inattention - No abnormality + 0  NIHSS Score: 3   Patient/Family was informed the Neurology Consult would occur via TeleHealth consult by way of interactive audio and video telecommunications and consented to receiving care in this manner.  Due to the immediate potential for life-threatening deterioration due to underlying acute neurologic illness, I spent 25 minutes providing critical care. This time includes time for face to face visit via telemedicine, review of medical records, imaging studies and discussion of findings with providers, the patient and/or family.   Dr Lloyd Huger   [facility] [phone])  [id number]  Case [id number]

## 2019-07-02 ENCOUNTER — Inpatient Hospital Stay (HOSPITAL_COMMUNITY): Payer: Medicaid Other

## 2019-07-02 DIAGNOSIS — G8194 Hemiplegia, unspecified affecting left nondominant side: Secondary | ICD-10-CM

## 2019-07-02 DIAGNOSIS — I10 Essential (primary) hypertension: Secondary | ICD-10-CM | POA: Diagnosis not present

## 2019-07-02 DIAGNOSIS — E785 Hyperlipidemia, unspecified: Secondary | ICD-10-CM | POA: Diagnosis not present

## 2019-07-02 DIAGNOSIS — I6389 Other cerebral infarction: Secondary | ICD-10-CM

## 2019-07-02 DIAGNOSIS — G459 Transient cerebral ischemic attack, unspecified: Secondary | ICD-10-CM

## 2019-07-02 DIAGNOSIS — I480 Paroxysmal atrial fibrillation: Secondary | ICD-10-CM

## 2019-07-02 DIAGNOSIS — R202 Paresthesia of skin: Secondary | ICD-10-CM

## 2019-07-02 DIAGNOSIS — I495 Sick sinus syndrome: Secondary | ICD-10-CM

## 2019-07-02 DIAGNOSIS — R7303 Prediabetes: Secondary | ICD-10-CM | POA: Diagnosis not present

## 2019-07-02 DIAGNOSIS — R531 Weakness: Secondary | ICD-10-CM

## 2019-07-02 DIAGNOSIS — I63232 Cerebral infarction due to unspecified occlusion or stenosis of left carotid arteries: Secondary | ICD-10-CM | POA: Diagnosis not present

## 2019-07-02 DIAGNOSIS — Z8673 Personal history of transient ischemic attack (TIA), and cerebral infarction without residual deficits: Secondary | ICD-10-CM

## 2019-07-02 DIAGNOSIS — F419 Anxiety disorder, unspecified: Secondary | ICD-10-CM | POA: Diagnosis not present

## 2019-07-02 LAB — LIPID PANEL
Cholesterol: 235 mg/dL — ABNORMAL HIGH (ref 0–200)
HDL: 41 mg/dL (ref >40)
LDL Cholesterol: 174 mg/dL — ABNORMAL HIGH (ref 0–99)
Total CHOL/HDL Ratio: 5.7 RATIO
Triglycerides: 98 mg/dL (ref <150)
VLDL: 20 mg/dL (ref 0–40)

## 2019-07-02 LAB — ECHOCARDIOGRAM COMPLETE
Height: 66 in
Weight: 4208 oz

## 2019-07-02 LAB — HIV ANTIBODY (ROUTINE TESTING W REFLEX): HIV Screen 4th Generation wRfx: NONREACTIVE — AB

## 2019-07-02 LAB — HEMOGLOBIN A1C
Hgb A1c MFr Bld: 5.9 % — ABNORMAL HIGH (ref 4.8–5.6)
Mean Plasma Glucose: 122.63 mg/dL

## 2019-07-02 LAB — SARS CORONAVIRUS 2 (TAT 6-24 HRS): SARS Coronavirus 2: NEGATIVE

## 2019-07-02 MED ORDER — GABAPENTIN 300 MG PO CAPS: 300.0000 mg | ORAL_CAPSULE | Freq: Every day | ORAL | 1 refills | Status: DC

## 2019-07-02 MED ORDER — IOHEXOL 350 MG/ML SOLN
75.0000 mL | Freq: Once | INTRAVENOUS | Status: AC | PRN
  Administered 2019-07-02: 75 mL via INTRAVENOUS

## 2019-07-02 MED ORDER — APIXABAN 5 MG PO TABS
5.0000 mg | ORAL_TABLET | Freq: Two times a day (BID) | ORAL | Status: DC
  Administered 2019-07-02: 5 mg via ORAL
  Filled 2019-07-02: qty 1

## 2019-07-02 MED ORDER — ATORVASTATIN CALCIUM 40 MG PO TABS
40.0000 mg | ORAL_TABLET | Freq: Every day | ORAL | Status: DC
  Administered 2019-07-02: 40 mg via ORAL
  Filled 2019-07-02: qty 1

## 2019-07-02 MED ORDER — APIXABAN 5 MG PO TABS: 5.0000 mg | ORAL_TABLET | Freq: Two times a day (BID) | ORAL | 1 refills | Status: DC

## 2019-07-02 MED ORDER — VITAMIN B-12 1000 MCG PO TABS: 1000.0000 ug | ORAL_TABLET | Freq: Every day | ORAL | Status: DC

## 2019-07-02 MED ORDER — CYANOCOBALAMIN 1000 MCG/ML IJ SOLN
1000.0000 ug | Freq: Once | INTRAMUSCULAR | Status: AC
  Administered 2019-07-02: 1000 ug via INTRAMUSCULAR
  Filled 2019-07-02: qty 1

## 2019-07-02 MED ORDER — CYANOCOBALAMIN 1000 MCG PO TABS: 1000.0000 ug | ORAL_TABLET | Freq: Every day | ORAL | 1 refills | Status: DC

## 2019-07-02 MED ORDER — ATORVASTATIN CALCIUM 40 MG PO TABS: 40.0000 mg | ORAL_TABLET | Freq: Every day | ORAL | 1 refills | Status: AC

## 2019-07-02 MED FILL — VITAMIN B-12 1000 MCG TABS: 1000 | 90 days supply | Qty: 90 | Fill #0

## 2019-07-02 MED FILL — GABAPENTIN 300 MG CAPSULE: 300 | 90 days supply | Qty: 90 | Fill #0

## 2019-07-02 MED FILL — ATORVASTATIN CALCIUM 40 MG: 40 | 90 days supply | Qty: 90 | Fill #0

## 2019-07-02 MED FILL — ELIQUIS 5 MG TABLET: 5 | 30 days supply | Qty: 60 | Fill #0

## 2019-07-02 NOTE — Evaluation (Signed)
Physical Therapy Evaluation Patient Details Name: SHASTINA MESICH MRN: [id number] DOB: October 01, 1970 Today's Date: 07/02/2019   History of Present Illness  49 y.o. female with a history of atrial fibrillation and pacemaker who used to be on anticoagulation but has not been for quite some time as well as a self-reported history of stroke who presents with left-sided weakness that has been going on for several days. She initially got some tingling in her left foot then gradually spread to her left hand over the course of a couple of days.  She also has left-sided weakness.  This is similar to what happened with her previous stroke.  Clinical Impression  Pt presents to PT with weakness and impaired sensation in LUE and LLE, however she is able to complete all mobility independently at this time. Pt demonstrates good dynamic balance, shuffling through purse in standing without noticeable balance deviations. Pt able to perform dynamic gait tasks including head turns and change gait speed without LOB. PT provides education on BEFAST and for considerations when mobilizing with impaired sensation, especially in the dark at night. Pt requires no further acute PT POC at this time as she is mobilizing near her baseline. Acute PT signing off.    Follow Up Recommendations No PT follow up    Equipment Recommendations  None recommended by PT    Recommendations for Other Services       Precautions / Restrictions Precautions Precautions: None Restrictions Weight Bearing Restrictions: No      Mobility  Bed Mobility Overal bed mobility: Independent                Transfers Overall transfer level: Independent Equipment used: None                Ambulation/Gait Ambulation/Gait assistance: Independent Gait Distance (Feet): 150 Feet Assistive device: None Gait Pattern/deviations: Step-through pattern Gait velocity: functional Gait velocity interpretation: >2.62 ft/sec, indicative of  community ambulatory General Gait Details: pt with steady step through gait, able to change gait speed and perform head turns without significant gait or balance deviation noted  Stairs Stairs: (pt declines need for stair assessment)          Wheelchair Mobility    Modified Rankin (Stroke Patients Only) Modified Rankin (Stroke Patients Only) Pre-Morbid Rankin Score: No symptoms Modified Rankin: No significant disability     Balance Overall balance assessment: Independent                                           Pertinent Vitals/Pain Pain Assessment: Faces Faces Pain Scale: Hurts a little bit Pain Location: head Pain Descriptors / Indicators: Aching Pain Intervention(s): Limited activity within patient's tolerance    Home Living Family/patient expects to be discharged to:: Private residence Living Arrangements: Non-relatives/Friends(roommate, 3 grandchildren under 62 yrs old) Available Help at Discharge: Friend(s);Available PRN/intermittently Type of Home: House Home Access: Stairs to enter Entrance Stairs-Rails: None Entrance Stairs-Number of Steps: 2 Home Layout: One level Home Equipment: None      Prior Function Level of Independence: Independent         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[employer], general practice        Extremity/Trunk Assessment   Upper Extremity Assessment Upper Extremity Assessment: LUE deficits/detail LUE Deficits / Details: 4/5 grossly with 4+/5 grip strength LUE Sensation: decreased light touch    Lower  Extremity Assessment Lower Extremity Assessment: LLE deficits/detail LLE Deficits / Details: 4/5 grossly LLE Sensation: decreased light touch    Cervical / Trunk Assessment Cervical / Trunk Assessment: Normal  Communication   Communication: No difficulties  Cognition Arousal/Alertness: Awake/alert Behavior During Therapy: WFL for tasks assessed/performed Overall Cognitive Status: Within Functional  Limits for tasks assessed                                        General Comments General comments (skin integrity, edema, etc.): pt reports some lightheadedness with looking up, no nystagmus noticed with vision assessment, pt reports vision [facility]    Exercises     Assessment/Plan    PT Assessment Patent does not need any further PT services  PT Problem List         PT Treatment Interventions      PT Goals (Current goals can be found in the Care Plan section)       Frequency     Barriers to discharge        Co-evaluation               AM-PAC PT "6 Clicks" Mobility  Outcome Measure Help needed turning from your back to your side while in a flat bed without using bedrails?: None Help needed moving from lying on your back to sitting on the side of a flat bed without using bedrails?: None Help needed moving to and from a bed to a chair (including a wheelchair)?: None Help needed standing up from a chair using your arms (e.g., wheelchair or bedside chair)?: None Help needed to walk in hospital room?: None Help needed climbing 3-5 steps with a railing? : None 6 Click Score: 24    End of Session   Activity Tolerance: Patient tolerated treatment well Patient left: in chair;with call bell/phone within reach Nurse Communication: Mobility status      Time: [id number] PT Time Calculation (min) (ACUTE ONLY): 25 min   Charges:   PT Evaluation $PT Eval Low Complexity: 1 Low          Zenaida Niece, PT, DPT Acute Rehabilitation Pager: [phone]   Zenaida Niece 07/02/2019, 9:49 AM

## 2019-07-02 NOTE — Progress Notes (Signed)
Occupational Therapy Evaluation Patient Details Name: Alison Wilkinson MRN: [id number] DOB: [birth year] Today's Date: 07/02/2019    History of Present Illness 49 y.o. female with a history of atrial fibrillation and pacemaker who used to be on anticoagulation but has not been for quite some time as well as a self-reported history of stroke who presents with left-sided weakness that has been going on for several days. She initially got some tingling in her left foot then gradually spread to her left hand over the course of a couple of days.  She also has left-sided weakness.  This is similar to what happened with her previous stroke.   Clinical Impression   PTA, pt independent with ADL and mobility and worked in a Production designer, theatre/television/film in fast paced job. Pt states that she continues to have symptoms of numbness in her hands and L foot in addition to things looking a little "distorted" and feeling "foggy-headed". Recommend pt refrain from driving at this time and follow up with outpt OT to facilitate safe return to work and IADL tasks. Pt also discussed feeling of anxiety and is interested in receiving resources regarding counseling for anxiety. Discussed with Tax inspector. Will attempt to follow up before DC to share information regarding stress/anxiety management. Pt appreciative.     Follow Up Recommendations  Outpatient OT    Equipment Recommendations  None recommended by OT    Recommendations for Other Services Other (comment)(Counseling for anxiety)     Precautions / Restrictions Precautions Precautions: None Restrictions Weight Bearing Restrictions: No      Mobility Bed Mobility Overal bed mobility: Independent                Transfers Overall transfer level: Independent Equipment used: None                  Balance Overall balance assessment: Independent                                         ADL either performed or assessed with clinical  judgement   ADL Overall ADL's : At baseline                                       General ADL Comments: Able to complete basic ADL tasks at baseline     Vision Baseline Vision/History: Wears glasses Wears Glasses: Reading only Patient Visual Report: Blurring of vision Vision Assessment?: Yes Eye Alignment: Within Functional Limits Ocular Range of Motion: Within Functional Limits Alignment/Gaze Preference: Within Defined Limits Tracking/Visual Pursuits: Able to track stimulus in all quads without difficulty Saccades: Within functional limits Convergence: Within functional limits Visual Fields: No apparent deficits Additional Comments: Pt describes things as a "little distorted"; "words look different"     Perception Perception Perception Tested?: Yes Comments: complains of "looking distorted"   Praxis Praxis Praxis tested?: Within functional limits    Pertinent Vitals/Pain Pain Assessment: 0-10 Faces Pain Scale: Hurts a little bit Pain Location: head Pain Descriptors / Indicators: Aching Pain Intervention(s): Limited activity within patient's tolerance     Hand Dominance Right   Extremity/Trunk Assessment Upper Extremity Assessment Upper Extremity Assessment: LUE deficits/detail;RUE deficits/detail RUE Sensation: decreased light touch(complaining of numbness R hand but not as significant as L) LUE Deficits / Details: 4/5 grossly with 4+/5 grip strength  LUE Sensation: decreased light touch LUE Coordination: (complaining of numbness - greater distally)   Lower Extremity Assessment Lower Extremity Assessment: LLE deficits/detail LLE Deficits / Details: 4/5 grossly LLE Sensation: decreased light touch   Cervical / Trunk Assessment Cervical / Trunk Assessment: Normal   Communication Communication Communication: No difficulties   Cognition Arousal/Alertness: Awake/alert Behavior During Therapy: WFL for tasks assessed/performed Overall Cognitive  Status: Impaired/Different from baseline Area of Impairment: Attention                   Current Attention Level: Selective           General Comments: Pt able to recall all medication and give details about her job. Pt staes she feels "foggy"   General Comments  pt reports some lightheadedness with looking up, no nystagmus noticed with vision assessment, pt reports vision [facility]    Exercises     Shoulder Instructions      Home Living Family/patient expects to be discharged to:: Private residence Living Arrangements: Non-relatives/Friends(roommate, 3 grandchildren under 66 yrs old) Available Help at Discharge: Friend(s);Available PRN/intermittently Type of Home: House Home Access: Stairs to enter [employer] of Steps: 2 Entrance Stairs-Rails: None Home Layout: One level     Bathroom Shower/Tub: Teacher, early years/pre: Standard Bathroom Accessibility: No   Home Equipment: None          Prior Functioning/Environment Level of Independence: Independent        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Production designer, 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        OT Problem List: Decreased strength;Impaired vision/perception;Decreased safety awareness;Obesity;Impaired sensation      OT Treatment/Interventions: Self-care/ADL training;Therapeutic activities;Visual/perceptual remediation/compensation;Patient/family education    OT Goals(Current goals can be found in the care plan section) Acute Rehab OT Goals Patient Stated Goal: to get back to normal OT Goal Formulation: With patient Time For Goal Achievement: 07/16/19 Potential to Achieve Goals: Good  OT Frequency: Min 2X/week   Barriers to D/C:            Co-evaluation              AM-PAC OT "6 Clicks" Daily Activity     Outcome Measure Help from another person eating meals?: None Help from another person taking care of personal grooming?: None Help from another person toileting, which includes using toliet,  bedpan, or urinal?: None Help from another person bathing (including washing, rinsing, drying)?: None Help from another person to put on and taking off regular upper body clothing?: None Help from another person to put on and taking off regular lower body clothing?: None 6 Click Score: 24   End of Session Nurse Communication: Mobility status;Other (comment)(DC recommendations)  Activity Tolerance: Patient tolerated treatment well Patient left: in chair;with call bell/phone within reach  OT Visit Diagnosis: Muscle weakness (generalized) (M62.81);Low vision, both eyes (H54.2);Other symptoms and signs involving cognitive function                Time: 1005-1029 OT Time Calculation (min): 24 min Charges:  OT General Charges $OT Visit: 1 Visit OT Evaluation $OT Eval Moderate Complexity: 1 Mod OT Treatments $Therapeutic Activity: 8-22 mins  Maurie Boettcher, OT/L   Acute OT Clinical Specialist Acute Rehabilitation Services Pager [phone] Office [phone]   [facility] 07/02/2019, 12:09 PM

## 2019-07-02 NOTE — Consult Note (Addendum)
Neurology Consultation Reason for Consult: Left-sided weakness Referring Physician: Dyann Kief, C  CC: Left-sided weakness  History is obtained from: Patient  HPI: Alison Wilkinson is a 49 y.o. female with a history of atrial fibrillation who used to be on anticoagulation but has not been for quite some time as well as a self-reported history of stroke who presents with left-sided weakness that has been going on for several days.  She states that she initially got some tingling in her left foot then gradually spread to her left hand over the course of a couple of days.  She also has left-sided weakness.  This is similar to what happened with her previous stroke.  Her stroke diagnosis led her to be diagnosed with atrial fibrillation and she was started on anticoagulation for multiple years, however after discussion with her cardiologist she has decided to no longer go on anticoagulation.  She also has a history of severe bradycardia in 2008 while she was on atenolol and Cardizem.  She underwent a pacemaker placement and has had one since that time.  She was transferred from [facility] for MRI, but unfortunately her pacemaker is not MRI compatible.  ROS: A 14 point ROS was performed and is negative except as noted in the HPI.   Past Medical History:  Diagnosis Date  . A-fib ([place])   . Anxiety   . Atrial tachycardia ([place])   . Cancer ([place])    thyroid  . Cardiomyopathy ([place])   . COPD (chronic obstructive pulmonary disease) ([place])   . Dyspnea   . Dysrhythmia   . GERD (gastroesophageal reflux disease)   . Headache   . High cholesterol   . History of kidney stones   . Hypertension   . Hyperthyroidism   . Hypothyroid   . Mixed hyperlipidemia   . Pacemaker    biotronic  PPM   DR. Bushnell   . Pacemaker   . Pneumonia    hx  . PONV (postoperative nausea and vomiting)   . Shortness of breath dyspnea    WITH EXERTION   . Sleep apnea    mild case no cpap reccommended  . SSS  (sick sinus syndrome) ([place])   . Stroke [facility])    2009   afib  (none in years)  . Syncope      Family History  Problem Relation Age of Onset  . Diabetes Mother   . Hyperlipidemia Mother   . Hypertension Mother   . Depression Mother   . Heart disease Mother   . Bipolar disorder Mother   . Liver disease Mother   . Sleep apnea Mother   . Obesity Mother   . Hypertension Father   . Stroke Father   . Heart disease Father   . Thyroid disease Father   . Alcoholism Father   . Obesity Father   . Hyperlipidemia Sister   . Hypertension Sister   . Thyroid disease Sister   . Hypertension Daughter   . Mental illness Daughter      Social History:  reports that she quit smoking about 2 years ago. Her smoking use included cigarettes. She has a 17.50 pack-year smoking history. She has quit using smokeless tobacco. She reports that she does not drink alcohol or use drugs.   Exam: Current vital signs: BP 115/63 (BP Location: Right Arm)   Pulse 66   Temp 98.1 F (36.7 C) (Oral)   Resp 19   Ht 5\' 6"  (1.676 m)  Wt 119.3 kg   LMP 05/28/2019   SpO2 96%   BMI 42.45 kg/m  Vital signs in last 24 hours: Temp:  [97.7 F (36.5 C)-98.8 F (37.1 C)] 98.1 F (36.7 C) (04/04 2325) Pulse Rate:  [59-72] 66 (04/04 2325) Resp:  [12-21] 19 (04/04 2325) BP: (109-157)/(61-83) 115/63 (04/04 2325) SpO2:  [95 %-100 %] 96 % (04/04 2325) Weight:  [119.3 kg] 119.3 kg (04/04 1013)   Physical Exam  Constitutional: Appears well-developed and well-nourished.  Psych: Affect appropriate to situation Eyes: No scleral injection HENT: No OP obstrucion MSK: no joint deformities.  Cardiovascular: Normal rate and regular rhythm.  Respiratory: Effort normal, non-labored breathing GI: Soft.  No distension. There is no tenderness.  Skin: WDI  Neuro: Mental Status: Patient is awake, alert, oriented to person, place, month, year, and situation. Patient is able to give a clear and coherent history. No signs  of aphasia or neglect Cranial Nerves: II: Visual Fields are full. Pupils are equal, round, and reactive to light.   III,IV, VI: EOMI without ptosis or diploplia.  V: Facial sensation is symmetric to temperature VII: Facial movement with mild left facial weakness VIII: hearing is intact to voice X: Uvula elevates symmetrically XI: Shoulder shrug is symmetric. XII: tongue is midline without atrophy or fasciculations.  Motor: Tone is normal. Bulk is normal. 5/5 strength was present on the right, she has mild weakness on the left, downward drift without pronation in the left arm. Sensory: Sensation is diminished throughout the left side Cerebellar: FNF and HKS are intact bilaterally    I have reviewed labs in epic and the results pertinent to this consultation are: CMP-unremarkable  I have reviewed the images obtained: CT head-negative  Impression: 49 year old female with left-sided weakness in the setting of a diagnosis of atrial fibrillation without anticoagulation.  I am uncertain how often she actually is in A. fib at this point, interrogation of her pacemaker may be helpful.  Her current episode does include positive symptoms (tingling) and headache and therefore I do think that complicated migraine has to be in the differential, but with her history I think we have to treat this as stroke unless proven otherwise.  I will treat for complicated migraine, if her symptoms resolve completely, then I would favor this being the diagnosis rather than stroke.  Recommendations: - HgbA1c, fasting lipid panel - Frequent neuro checks - Echocardiogram - CTA head neck - Prophylactic therapy-Antiplatelet med: Aspirin - dose 325mg  PO or 300mg  PR - Risk factor modification - Telemetry monitoring - PT consult, OT consult, Speech consult - Stroke team to follow - May need to consider anticoagulation given her history of A. fib and a stroke, but not certain how often she actually is in A. fib and  interrogation of her pacemaker may help with that. -Migraine cocktail if deficits improve with this, then no further work-up is needed.  Roland Rack, MD [facility] [phone]  If 7pm- 7am, please page neurology on call as listed in [place].

## 2019-07-02 NOTE — Social Work (Signed)
CSW met with pt at bedside. CSW introduced herself and explained her role. CSW completed sbirt with pt. Pt scored a 0 on the sbirt scale. Pt denied needing resources.   Emeterio Reeve, Latanya Presser, [place] Licensed Clinical Social Worker [phone]

## 2019-07-02 NOTE — Progress Notes (Signed)
  Echocardiogram 2D Echocardiogram has been performed.  Jennette Dubin 07/02/2019, 10:16 AM

## 2019-07-02 NOTE — Discharge Summary (Signed)
Physician Discharge Summary  Alison Wilkinson IRJ:[id number] DOB: 10-23-1970 DOA: 07/01/2019  PCP: Baruch Gouty, FNP  Admit date: 07/01/2019 Discharge date: 07/02/2019  Admitted From: Home Disposition: Home  Recommendations for Outpatient Follow-up:  1. Follow ups as below. 2. Please obtain CBC/BMP/Mag at follow up 3. Please follow up on the following pending results: None  Home Health: Outpatient PT Equipment/Devices: None required  Discharge Condition: Stable CODE STATUS: Full code  Follow-up Information    [facility]. Call.   Specialty: Rehabilitation Contact information: [place] [id number] [place] [phone]          Hospital Course: 49 year old female with history of prior CVA, paroxysmal A. fib not on anticoagulation, SSS s/p PPM, HTN, COPD, hypothyroidism, GERD, morbid obesity and anxiety presented to [facility] with left-sided weakness and numbness and tingling in both hands and left foot for over 2 days.   In the ED, hemodynamically stable.  CT head without contrast without acute finding.  Blood work including CBC, CMP, CRP and ESR fairly benign.  Vitamin B12 187.  UDS negative.  EKG sinus rhythm without acute ischemic finding, abnormal interval arrhythmia.  Patient was transferred to [facility] for further work-up given her risk factors.  At [facility], CTA head and neck without flow obstructing stenosis.  Was not able to obtain MRI due to a PPM which is not compatible with MRI.  A1c 5.9.  LDL 174.  Echocardiogram with normal EF and G1 DD.  Patient was started on Eliquis for anticoagulation given elevated CHA2DS2-VASc of 4 (prior CVA, HTN and sex).  She was also started on atorvastatin.  She was cleared for discharge by neurology.  Evaluated by PT/OT who recommended outpatient PT.  No DME required.  Discharge Diagnoses:  Acute left hemiparesis/paresthesia-CT head without contrast and CTA  head and neck without acute finding.  Unfortunately, not able to do MRI due to her pacemaker.  Symptoms improved.  -Started on Eliquis for anticoagulation given history of PAF and elevated CHA2DS2-VASc score. -Started on atorvastatin -Continue p.o. vitamin B12 1000 mcg daily. -Added low-dose gabapentin at night. -Outpatient PT  Paroxysmal A. Fib: Now in sinus rhythm.  CHA2DS2-VASc score 4.  No history of GI bleed.  Previously tolerated warfarin. -Continue home beta-blocker -Started Eliquis 5 mg twice daily.  SSS s/p PPM: EKG with sinus rhythm. -Outpatient follow-up.  Prediabetes/hyperlipidemia/morbid obesity: Body mass index is 42.45 kg/m. -A1c 5.9%.  LDL 174. -Encourage lifestyle change to lose weight -May consider GLP-1 inhibitors -Continue statin  Hypothyroidism -Continue home Synthroid  Chronic COPD: Stable. -Continue home medications  Anxiety/chronic pain -Continue home medications  GERD: -Continue home medications    Discharge Instructions  Discharge Instructions    Ambulatory referral to Occupational Therapy   Complete by: As directed    Diet - low sodium heart healthy   Complete by: As directed    Discharge instructions   Complete by: As directed    It has been a pleasure taking care of you! You were hospitalized with left sided weakness and numbness in your hands and your left foot.  The CT scan of your head did not show stroke or major blockage of the blood vessels in your neck or your head.  However, this does not exclude stroke.  Unfortunately, we are not able to do an MRI of the brain due to pacemaker.  Given your atrial fibrillation and other risk factors for stroke, we have started you on a blood thinner,  Eliquis (apixaban).  We stopped your aspirin.  We strongly recommend you avoid any over-the-counter pain medications except plain Tylenol. Please review your new medication list and the directions before you take your medications.  Please follow-up with  your primary care doctor and cardiologist in 1 to 2 weeks.   Take care,   Increase activity slowly   Complete by: As directed      Allergies as of 07/02/2019      Reactions   Carvedilol Palpitations, Shortness Of Breath   Per pt report Per pt report Per pt report Per pt report Per pt report   Lisinopril Shortness Of Breath   Sulfa Antibiotics Nausea Only, Other (See Comments), Nausea And Vomiting   Headache  Headache  Headache  Headache  Headache       Medication List    STOP taking these medications   aspirin EC 81 MG tablet     TAKE these medications   acetaminophen 500 MG tablet Commonly known as: TYLENOL Take 1,000 mg by mouth every 6 (six) hours as needed.   albuterol 108 (90 Base) MCG/ACT inhaler Commonly known as: VENTOLIN HFA Inhale 1-2 puffs into the lungs every 6 (six) hours as needed for wheezing or shortness of breath.   albuterol (2.5 MG/3ML) 0.083% nebulizer solution Commonly known as: PROVENTIL Take 3 mLs (2.5 mg total) by nebulization every 6 (six) hours as needed for wheezing or shortness of breath.   apixaban 5 MG Tabs tablet Commonly known as: ELIQUIS Take 1 tablet (5 mg total) by mouth 2 (two) times daily.   atorvastatin 40 MG tablet Commonly known as: LIPITOR Take 1 tablet (40 mg total) by mouth daily at 6 PM.   clonazePAM 0.5 MG tablet Commonly known as: KLONOPIN Take 0.5 mg by mouth daily as needed for anxiety.   cyanocobalamin 1000 MCG tablet Take 1 tablet (1,000 mcg total) by mouth daily. Start taking on: July 03, 2019   ergocalciferol 1.25 MG (50000 UT) capsule Commonly known as: VITAMIN D2 Take 1 capsule (50,000 Units total) by mouth once a week for 30 doses.   gabapentin 300 MG capsule Commonly known as: Neurontin Take 1 capsule (300 mg total) by mouth at bedtime.   HYDROcodone-acetaminophen 5-325 MG tablet Commonly known as: NORCO/VICODIN Take 1 tablet by mouth every 6 (six) hours as needed for moderate pain.     levothyroxine 112 MCG tablet Commonly known as: SYNTHROID Take 112 mcg by mouth daily before breakfast.   metoprolol succinate 25 MG 24 hr tablet Commonly known as: TOPROL-XL Take 25 mg by mouth daily.   omeprazole 40 MG capsule Commonly known as: PRILOSEC Take 40 mg by mouth 2 (two) times daily.       Consultations:  Neurology  Procedures/Studies:  2D Echo on 07/02/2019 1. Left ventricular ejection fraction, by estimation, is 55 to 60%. The  left ventricle has normal function. The left ventricle has no regional  wall motion abnormalities. Left ventricular diastolic parameters are  consistent with Grade I diastolic  dysfunction (impaired relaxation).  2. Right ventricular systolic function is normal. The right ventricular  size is normal. There is normal pulmonary artery systolic pressure. The  estimated right ventricular systolic pressure is 67.8 mmHg.  3. The mitral valve is grossly normal. No evidence of mitral valve  regurgitation. No evidence of mitral stenosis.  4. The aortic valve is tricuspid. Aortic valve regurgitation is not  visualized. No aortic stenosis is present.  5. The inferior vena cava is dilated in  size with >50% respiratory  variability, suggesting right atrial pressure of 8 mmHg.    CT ANGIO HEAD W OR WO CONTRAST  Result Date: 07/02/2019 CLINICAL DATA:  Stroke follow-up. Left-sided weakness and tingling. History of atrial fibrillation. EXAM: CT ANGIOGRAPHY HEAD AND NECK TECHNIQUE: Multidetector CT imaging of the head and neck was performed using the standard protocol during bolus administration of intravenous contrast. Multiplanar CT image reconstructions and MIPs were obtained to evaluate the vascular anatomy. Carotid stenosis measurements (when applicable) are obtained utilizing NASCET criteria, using the distal internal carotid diameter as the denominator. CONTRAST:  69m OMNIPAQUE IOHEXOL 350 MG/ML SOLN COMPARISON:  None. FINDINGS: CTA NECK  FINDINGS Aortic arch: Standard 3 vessel aortic arch with widely patent arch vessel origins. Right carotid system: Patent without evidence of stenosis or dissection. Retropharyngeal course of the proximal ICA. Left carotid system: Patent with minimal calcified plaque at the carotid bifurcation. No evidence of stenosis or dissection. Vertebral arteries: Patent without evidence of stenosis or dissection. Mildly dominant right vertebral artery. Skeleton: No acute osseous abnormality or suspicious osseous lesion. Other neck: No evidence of cervical lymphadenopathy or mass. Upper chest: Clear lung apices. Review of the MIP images confirms the above findings CTA HEAD FINDINGS Anterior circulation: The internal carotid arteries are patent from skull base to carotid termini with small volume calcified plaque on the right not resulting in significant stenosis. ACAs and MCAs are patent without evidence of proximal branch occlusion or significant M1 stenosis. Diffusely irregular appearance of the small and medium-sized vessels as well as the A1 segments on thick MIPs is felt to be artifactual related to image noise and suboptimal arterial opacification. No aneurysm is identified. Posterior circulation: The intracranial vertebral arteries are widely patent to the basilar. The basilar artery is widely patent. Posterior communicating arteries are not clearly identified and may be diminutive or absent. Both PCAs are patent without definite flow limiting proximal stenosis. No aneurysm is identified. Venous sinuses: Patent. Anatomic variants: None. Review of the MIP images confirms the above findings IMPRESSION: Minimal atherosclerosis in the head and neck without large vessel occlusion or flow limiting proximal stenosis. Electronically Signed   By: ALogan BoresM.D.   On: 07/02/2019 08:24   CT Head Wo Contrast  Result Date: 07/01/2019 CLINICAL DATA:  Numbness and tingling in hands and feet for 2 days. Extreme thirst and headache.  EXAM: CT HEAD WITHOUT CONTRAST TECHNIQUE: Contiguous axial images were obtained from the base of the skull through the vertex without intravenous contrast. COMPARISON:  03/24/2014 FINDINGS: Brain: The ventricles are normal in size and configuration. No extra-axial fluid collections are identified. The gray-white differentiation is maintained. No CT findings for acute hemispheric infarction or intracranial hemorrhage. No mass lesions. The brainstem and cerebellum are normal. Vascular: No hyperdense vessels or obvious aneurysm. Skull: No acute skull fracture. No bone lesion. Sinuses/Orbits: The paranasal sinuses and mastoid air cells are clear. The globes are intact. Other: No scalp lesions, laceration or hematoma. IMPRESSION: Normal head CT. Electronically Signed   By: PMarijo SanesM.D.   On: 07/01/2019 11:24   CT ANGIO NECK W OR WO CONTRAST  Result Date: 07/02/2019 CLINICAL DATA:  Stroke follow-up. Left-sided weakness and tingling. History of atrial fibrillation. EXAM: CT ANGIOGRAPHY HEAD AND NECK TECHNIQUE: Multidetector CT imaging of the head and neck was performed using the standard protocol during bolus administration of intravenous contrast. Multiplanar CT image reconstructions and MIPs were obtained to evaluate the vascular anatomy. Carotid stenosis measurements (when applicable) are obtained  utilizing NASCET criteria, using the distal internal carotid diameter as the denominator. CONTRAST:  65m OMNIPAQUE IOHEXOL 350 MG/ML SOLN COMPARISON:  None. FINDINGS: CTA NECK FINDINGS Aortic arch: Standard 3 vessel aortic arch with widely patent arch vessel origins. Right carotid system: Patent without evidence of stenosis or dissection. Retropharyngeal course of the proximal ICA. Left carotid system: Patent with minimal calcified plaque at the carotid bifurcation. No evidence of stenosis or dissection. Vertebral arteries: Patent without evidence of stenosis or dissection. Mildly dominant right vertebral artery.  Skeleton: No acute osseous abnormality or suspicious osseous lesion. Other neck: No evidence of cervical lymphadenopathy or mass. Upper chest: Clear lung apices. Review of the MIP images confirms the above findings CTA HEAD FINDINGS Anterior circulation: The internal carotid arteries are patent from skull base to carotid termini with small volume calcified plaque on the right not resulting in significant stenosis. ACAs and MCAs are patent without evidence of proximal branch occlusion or significant M1 stenosis. Diffusely irregular appearance of the small and medium-sized vessels as well as the A1 segments on thick MIPs is felt to be artifactual related to image noise and suboptimal arterial opacification. No aneurysm is identified. Posterior circulation: The intracranial vertebral arteries are widely patent to the basilar. The basilar artery is widely patent. Posterior communicating arteries are not clearly identified and may be diminutive or absent. Both PCAs are patent without definite flow limiting proximal stenosis. No aneurysm is identified. Venous sinuses: Patent. Anatomic variants: None. Review of the MIP images confirms the above findings IMPRESSION: Minimal atherosclerosis in the head and neck without large vessel occlusion or flow limiting proximal stenosis. Electronically Signed   By: ALogan BoresM.D.   On: 07/02/2019 08:24   ECHOCARDIOGRAM COMPLETE  Result Date: 07/02/2019    ECHOCARDIOGRAM REPORT   Patient Name:   Alison BALDate of Exam: 07/02/2019 Medical Rec #:  [id number]      Height:       66.0 in Accession #:    [id number]     Weight:       263.0 lb Date of Birth:  112-22-1972     BSA:          2.247 m Patient Age:    89years        BP:           129/81 mmHg Patient Gender: F               HR:           65 bpm. Exam Location:  Inpatient Procedure: 2D Echo                                 MODIFIED REPORT: This report was modified by WEleonore ChiquitoMD on 07/02/2019 due to conclusions now                                       added.  Indications:     Stroke $34.91  History:         Patient has prior history of Echocardiogram examinations, most                  recent 05/26/2018. Pacemaker, COPD, Arrythmias:Atrial                  Fibrillation, Signs/Symptoms:Syncope; Risk  Factors:Hypertension.  Sonographer:     Mikki Santee RDCS (AE) Referring Phys:  [place] Diagnosing Phys: Eleonore Chiquito MD IMPRESSIONS  1. Left ventricular ejection fraction, by estimation, is 55 to 60%. The left ventricle has normal function. The left ventricle has no regional wall motion abnormalities. Left ventricular diastolic parameters are consistent with Grade I diastolic dysfunction (impaired relaxation).  2. Right ventricular systolic function is normal. The right ventricular size is normal. There is normal pulmonary artery systolic pressure. The estimated right ventricular systolic pressure is 06.0 mmHg.  3. The mitral valve is grossly normal. No evidence of mitral valve regurgitation. No evidence of mitral stenosis.  4. The aortic valve is tricuspid. Aortic valve regurgitation is not visualized. No aortic stenosis is present.  5. The inferior vena cava is dilated in size with >50% respiratory variability, suggesting right atrial pressure of 8 mmHg. Comparison(s): A prior study was performed on 05/26/2018. Prior images unable to be directly viewed, comparison made by report only. No significant change from prior study. Conclusion(s)/Recommendation(s): No intracardiac source of embolism detected on this transthoracic study. A transesophageal echocardiogram is recommended to exclude cardiac source of embolism if clinically indicated. FINDINGS  Left Ventricle: Left ventricular ejection fraction, by estimation, is 55 to 60%. The left ventricle has normal function. The left ventricle has no regional wall motion abnormalities. The left ventricular internal cavity size was normal in size. There is  no left  ventricular hypertrophy. Left ventricular diastolic parameters are consistent with Grade I diastolic dysfunction (impaired relaxation). Right Ventricle: The right ventricular size is normal. No increase in right ventricular wall thickness. Right ventricular systolic function is normal. There is normal pulmonary artery systolic pressure. The tricuspid regurgitant velocity is 2.00 m/s, and  with an assumed right atrial pressure of 8 mmHg, the estimated right ventricular systolic pressure is 04.5 mmHg. Left Atrium: Left atrial size was normal in size. Right Atrium: Right atrial size was normal in size. Pericardium: There is no evidence of pericardial effusion. Presence of pericardial fat pad. Mitral Valve: The mitral valve is grossly normal. No evidence of mitral valve regurgitation. No evidence of mitral valve stenosis. Tricuspid Valve: The tricuspid valve is grossly normal. Tricuspid valve regurgitation is mild . No evidence of tricuspid stenosis. Aortic Valve: The aortic valve is tricuspid. Aortic valve regurgitation is not visualized. No aortic stenosis is present. Pulmonic Valve: The pulmonic valve was grossly normal. Pulmonic valve regurgitation is not visualized. No evidence of pulmonic stenosis. Aorta: The aortic root and ascending aorta are structurally normal, with no evidence of dilitation. Venous: The inferior vena cava is dilated in size with greater than 50% respiratory variability, suggesting right atrial pressure of 8 mmHg. IAS/Shunts: No atrial level shunt detected by color flow Doppler. Additional Comments: A pacer wire is visualized in the right atrium and right ventricle.  LEFT VENTRICLE PLAX 2D LVIDd:         5.30 cm  Diastology LVIDs:         3.70 cm  LV e' lateral:   9.57 cm/s LV PW:         1.00 cm  LV E/e' lateral: 6.3 LV IVS:        0.90 cm  LV e' medial:    7.72 cm/s LVOT diam:     2.30 cm  LV E/e' medial:  7.8 LV SV:         68 LV SV Index:   30 LVOT Area:     4.15 cm  RIGHT  VENTRICLE RV S  prime:     9.79 cm/s TAPSE (M-mode): 1.8 cm LEFT ATRIUM             Index       RIGHT ATRIUM           Index LA diam:        4.00 cm 1.78 cm/m  RA Area:     12.20 cm LA Vol (A2C):   60.7 ml 27.02 ml/m RA Volume:   24.00 ml  10.68 ml/m LA Vol (A4C):   50.9 ml 22.66 ml/m LA Biplane Vol: 60.6 ml 26.97 ml/m  AORTIC VALVE LVOT Vmax:   69.00 cm/s LVOT Vmean:  47.600 cm/s LVOT VTI:    0.164 m  AORTA Ao Root diam: 3.50 cm MITRAL VALVE               TRICUSPID VALVE MV Area (PHT): 3.72 cm    TR Peak grad:   16.0 mmHg MV Decel Time: 204 msec    TR Vmax:        200.00 cm/s MV E velocity: 60.20 cm/s MV A velocity: 62.90 cm/s  SHUNTS MV E/A ratio:  0.96        Systemic VTI:  0.16 m                            Systemic Diam: 2.30 cm Eleonore Chiquito MD Electronically signed by Eleonore Chiquito MD Signature Date/Time: 07/02/2019/11:30:14 AM    Final (Updated)        Discharge Exam: Vitals:   07/02/19 0622 07/02/19 1049  BP: 129/81 125/63  Pulse: 64 66  Resp: 18 18  Temp: 98.2 F (36.8 C) 98 F (36.7 C)  SpO2: 97% 98%    GENERAL: No acute distress.  Appears well.  HEENT: MMM.  Vision and hearing grossly intact.  NECK: Supple.  No apparent JVD.  RESP:  No IWOB. Good air movement bilaterally. CVS:  RRR. Heart sounds normal.  ABD/GI/GU: Bowel sounds present. Soft. Non tender.  MSK/EXT:  Moves extremities. No apparent deformity or edema.  SKIN: no apparent skin lesion or wound NEURO: Awake, alert and oriented appropriately.  PERRL.  EOMI.  No facial asymmetry.  Speech clear.  Motor 4/5 in LUE and LLE.  5/5 in RUE and RLE.  Diminished light sensation in LLE, LUE and V2 and V3 distribution.  No pronator drift.  Patellar reflex symmetric. PSYCH: Calm. Normal affect.    The results of significant diagnostics from this hospitalization (including imaging, microbiology, ancillary and laboratory) are listed below for reference.     Microbiology: Recent Results (from the past 240 hour(s))  SARS CORONAVIRUS 2 (TAT  6-24 HRS) Nasopharyngeal Nasopharyngeal Swab     Status: None   Collection Time: 07/01/19 12:32 PM   Specimen: Nasopharyngeal Swab  Result Value Ref Range Status   SARS Coronavirus 2 NEGATIVE NEGATIVE Final    Comment: (NOTE) SARS-CoV-2 target nucleic acids are NOT DETECTED. The SARS-CoV-2 RNA is generally detectable in upper and lower respiratory specimens during the acute phase of infection. Negative results do not preclude SARS-CoV-2 infection, do not rule out co-infections with other pathogens, and should not be used as the sole basis for treatment or other patient management decisions. Negative results must be combined with clinical observations, patient history, and epidemiological information. The expected result is Negative. Fact Sheet for Patients: SugarRoll.be Fact Sheet for Healthcare Providers: https://www.woods-mathews.com/ This test is not yet approved or  cleared by the [place] and  has been authorized for detection and/or diagnosis of SARS-CoV-2 by FDA under an Emergency Use Authorization (EUA). This EUA will remain  in effect (meaning this test can be used) for the duration of the COVID-19 declaration under Section 56 4(b)(1) of the Act, 21 U.S.C. section 360bbb-3(b)(1), unless the authorization is terminated or revoked sooner. Performed at [facility], [place] [address], [place], [place] [address]      Labs: BNP (last 3 results) Recent Labs    02/02/19 1243  BNP 3.5   Basic Metabolic Panel: Recent Labs  Lab 07/01/19 1034  NA 139  K 3.5  CL 105  CO2 25  GLUCOSE 103*  BUN 26*  CREATININE 1.09*  CALCIUM 8.8*  MG 2.2   Liver Function Tests: Recent Labs  Lab 07/01/19 1034  AST 16  ALT 16  ALKPHOS 50  BILITOT 0.3  PROT 7.8  ALBUMIN 4.3   No results for input(s): LIPASE, AMYLASE in the last 168 hours. No results for input(s): AMMONIA in the last 168 hours. CBC: Recent Labs  Lab  07/01/19 1034  WBC 9.0  NEUTROABS 4.8  HGB 13.5  HCT 42.5  MCV 95.9  PLT 328   Cardiac Enzymes: No results for input(s): CKTOTAL, CKMB, CKMBINDEX, TROPONINI in the last 168 hours. BNP: Invalid input(s): POCBNP CBG: Recent Labs  Lab 07/01/19 1009  GLUCAP 95   D-Dimer No results for input(s): DDIMER in the last 72 hours. Hgb A1c No results for input(s): HGBA1C in the last 72 hours. Lipid Profile No results for input(s): CHOL, HDL, LDLCALC, TRIG, CHOLHDL, LDLDIRECT in the last 72 hours. Thyroid function studies Recent Labs    07/01/19 1034  TSH 2.977   Anemia work up Recent Labs    07/01/19 1037  VITAMINB12 187   Urinalysis    Component Value Date/Time   COLORURINE STRAW (A) 07/01/2019 1231   APPEARANCEUR CLEAR 07/01/2019 1231   LABSPEC 1.005 07/01/2019 1231   PHURINE 5.0 07/01/2019 1231   GLUCOSEU NEGATIVE 07/01/2019 1231   HGBUR MODERATE (A) 07/01/2019 1231   BILIRUBINUR NEGATIVE 07/01/2019 1231   KETONESUR NEGATIVE 07/01/2019 1231   PROTEINUR NEGATIVE 07/01/2019 1231   UROBILINOGEN 1.0 10/23/2014 2142   NITRITE NEGATIVE 07/01/2019 1231   LEUKOCYTESUR NEGATIVE 07/01/2019 1231   Sepsis Labs Invalid input(s): PROCALCITONIN,  WBC,  LACTICIDVEN   Time coordinating discharge: 40 minutes  SIGNED:  Mercy Riding, MD  [facility] 07/02/2019, 12:15 PM  If 7PM-7AM, please contact night-coverage [email or web address] Password [email or web address]

## 2019-07-02 NOTE — TOC Initial Note (Addendum)
Transition of Care [facility]) - Initial/Assessment Note    Patient Details  Name: Alison Wilkinson MRN: [id number] Date of Birth: [birth year]  Transition of Care [facility]) CM/SW Contact:    Marilu Favre, RN Phone Number: 07/02/2019, 11:11 AM  Clinical Narrative:                 Spoke to patient at bedside. Confirmed face sheet information. Patient from home with room mate and three minor grandchildren. Patient has no DME in home.  OT recommending OP OT. Listed off centers patient prefers [facility], referral sent, contact information placed on AVS.   Patient requesting counselor list for anxiety. Provided list.    Asked to enter benefit  check for Eliquis. Patient has medicaid. Eliquis is on Medicaid preferred list, co pay $3   Expected Discharge Plan: Home/Self Care Barriers to Discharge: Continued Medical Work up   Patient Goals and CMS Choice Patient states their goals for this hospitalization and ongoing recovery are:: to return to home CMS Medicare.gov Compare Post Acute Care list provided to:: Patient Choice offered to / list presented to : Patient  Expected Discharge Plan and Services Expected Discharge Plan: Home/Self Care   Discharge Planning Services: CM Consult   Living arrangements for the past 2 months: Single Family Home                 DME Arranged: N/A         HH Arranged: NA          Prior Living Arrangements/Services Living arrangements for the past 2 months: Single Family Home Lives with:: Roommate Patient language and need for interpreter reviewed:: Yes Do you feel safe going back to the place where you live?: Yes      Need for Family Participation in Patient Care: Yes (Comment) Care giver support system in place?: Yes (comment)   Criminal Activity/Legal Involvement Pertinent to Current Situation/Hospitalization: No - Comment as needed  Activities of Daily Living Home Assistive Devices/Equipment: Eyeglasses ADL Screening (condition at time of  admission) Patient's cognitive ability adequate to safely complete daily activities?: Yes Is the patient deaf or have difficulty hearing?: No Does the patient have difficulty seeing, even when wearing glasses/contacts?: No Does the patient have difficulty concentrating, remembering, or making decisions?: No Patient able to express need for assistance with ADLs?: Yes Does the patient have difficulty dressing or bathing?: No Independently performs ADLs?: Yes (appropriate for developmental age) Does the patient have difficulty walking or climbing stairs?: No Weakness of Legs: None Weakness of Arms/Hands: None  Permission Sought/Granted   Permission granted to share information with : No              Emotional Assessment Appearance:: Appears stated age Attitude/Demeanor/Rapport: Engaged Affect (typically observed): Accepting Orientation: : Oriented to Self, Oriented to Place, Oriented to  Time, Oriented to Situation Alcohol / Substance Use: Not Applicable Psych Involvement: No (comment)  Admission diagnosis:  Acute ischemic stroke (HCC) [I63.9] Left-sided weakness [R53.1] Stroke-like symptoms [R29.90] Patient Active Problem List   Diagnosis Date Noted  . Left-sided weakness 07/01/2019  . Paresthesia 02/01/2019  . GERD (gastroesophageal reflux disease) 09/21/2018  . Other fatigue 06/09/2017  . Chronic atrial fibrillation ([place]) 06/09/2017  . Other specified hypothyroidism 06/09/2017  . Vitamin D deficiency 06/09/2017  . B12 nutritional deficiency 06/09/2017  . Hyperglycemia 06/09/2017  . Chronic obstructive pulmonary disease ([place]) 06/09/2017  . Pacemaker 05/27/2016  . Acute low back pain 04/09/2016  . Solitary pulmonary nodule 02/05/2016  .  History of thyroid cancer 01/08/2016  . Tobacco abuse 05/12/2015  . History of colonic polyps 05/12/2015  . Hypocalcemia 04/28/2015  . H/O total thyroidectomy 04/28/2015  . Hypomagnesemia 04/28/2015  . Epigastric abdominal pain  04/17/2015  . Hyperthyroidism 11/12/2014  . Nonischemic cardiomyopathy ([place]) 11/12/2014  . Mixed hyperlipidemia 11/12/2014  . 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/11/2014  . Sick sinus syndrome ([place]) 11/11/2014  . Anxiety state 11/11/2014  . History of CVA (cerebrovascular accident) 11/11/2014  . Essential hypertension 05/28/2014   PCP:  Baruch Gouty, FNP Pharmacy:   [facility] [address], [place] - [address] [place] [address] Phone: [phone] Fax: [phone]  [facility] [place], [place] - [address] [place] [address] Phone: [phone] Fax: [phone]     Social Determinants of Health (SDOH) Interventions    Readmission Risk Interventions No flowsheet data found.

## 2019-07-02 NOTE — Discharge Instructions (Signed)

## 2019-07-02 NOTE — Progress Notes (Signed)
STROKE TEAM PROGRESS NOTE   INTERVAL HISTORY RN at bedside.  Patient sitting in chair, no acute distress.  MRI not able to perform due to pacemaker.  CT repeat no obvious infarct.  CT head neck unremarkable.  Discussed about Eliquis for stroke prevention, she agrees.  Vitals:   07/01/19 2325 07/02/19 0125 07/02/19 0622 07/02/19 1049  BP: 115/63 121/62 129/81 125/63  Pulse: 66 67 64 66  Resp: 19 20 18 18   Temp: 98.1 F (36.7 C) 97.9 F (36.6 C) 98.2 F (36.8 C) 98 F (36.7 C)  TempSrc: Oral Oral Oral Oral  SpO2: 96% 97% 97% 98%  Weight:      Height:        CBC:  Recent Labs  Lab 07/01/19 1034  WBC 9.0  NEUTROABS 4.8  HGB 13.5  HCT 42.5  MCV 95.9  PLT [id number]    Basic Metabolic Panel:  Recent Labs  Lab 07/01/19 1034  NA 139  K 3.5  CL 105  CO2 25  GLUCOSE 103*  BUN 26*  CREATININE 1.09*  CALCIUM 8.8*  MG 2.2   Lipid Panel:     Component Value Date/Time   CHOL 235 (H) 07/02/2019 1141   CHOL 311 (H) 02/02/2019 1243   TRIG 98 07/02/2019 1141   HDL 41 07/02/2019 1141   HDL 52 02/02/2019 1243   CHOLHDL 5.7 07/02/2019 1141   VLDL 20 07/02/2019 1141   LDLCALC 174 (H) 07/02/2019 1141   LDLCALC 230 (H) 02/02/2019 1243   HgbA1c:  Lab Results  Component Value Date   HGBA1C 5.9 (H) 07/02/2019   Urine Drug Screen:     Component Value Date/Time   LABOPIA NONE DETECTED 07/01/2019 1231   COCAINSCRNUR NONE DETECTED 07/01/2019 1231   LABBENZ NONE DETECTED 07/01/2019 1231   AMPHETMU NONE DETECTED 07/01/2019 1231   THCU NONE DETECTED 07/01/2019 1231   LABBARB NONE DETECTED 07/01/2019 1231    Alcohol Level     Component Value Date/Time   ETH <10 07/01/2019 1034    IMAGING past 24 hours CT ANGIO HEAD W OR WO CONTRAST  Result Date: 07/02/2019 CLINICAL DATA:  Stroke follow-up. Left-sided weakness and tingling. History of atrial fibrillation. EXAM: CT ANGIOGRAPHY HEAD AND NECK TECHNIQUE: Multidetector CT imaging of the head and neck was performed using the standard  protocol during bolus administration of intravenous contrast. Multiplanar CT image reconstructions and MIPs were obtained to evaluate the vascular anatomy. Carotid stenosis measurements (when applicable) are obtained utilizing NASCET criteria, using the distal internal carotid diameter as the denominator. CONTRAST:  42mL OMNIPAQUE IOHEXOL 350 MG/ML SOLN COMPARISON:  None. FINDINGS: CTA NECK FINDINGS Aortic arch: Standard 3 vessel aortic arch with widely patent arch vessel origins. Right carotid system: Patent without evidence of stenosis or dissection. Retropharyngeal course of the proximal ICA. Left carotid system: Patent with minimal calcified plaque at the carotid bifurcation. No evidence of stenosis or dissection. Vertebral arteries: Patent without evidence of stenosis or dissection. Mildly dominant right vertebral artery. Skeleton: No acute osseous abnormality or suspicious osseous lesion. Other neck: No evidence of cervical lymphadenopathy or mass. Upper chest: Clear lung apices. Review of the MIP images confirms the above findings CTA HEAD FINDINGS Anterior circulation: The internal carotid arteries are patent from skull base to carotid termini with small volume calcified plaque on the right not resulting in significant stenosis. ACAs and MCAs are patent without evidence of proximal branch occlusion or significant M1 stenosis. Diffusely irregular appearance of the small and medium-sized vessels as  well as the A1 segments on thick MIPs is felt to be artifactual related to image noise and suboptimal arterial opacification. No aneurysm is identified. Posterior circulation: The intracranial vertebral arteries are widely patent to the basilar. The basilar artery is widely patent. Posterior communicating arteries are not clearly identified and may be diminutive or absent. Both PCAs are patent without definite flow limiting proximal stenosis. No aneurysm is identified. Venous sinuses: Patent. Anatomic variants:  None. Review of the MIP images confirms the above findings IMPRESSION: Minimal atherosclerosis in the head and neck without large vessel occlusion or flow limiting proximal stenosis. Electronically Signed   By: Logan Bores M.D.   On: 07/02/2019 08:24   CT ANGIO NECK W OR WO CONTRAST  Result Date: 07/02/2019 CLINICAL DATA:  Stroke follow-up. Left-sided weakness and tingling. History of atrial fibrillation. EXAM: CT ANGIOGRAPHY HEAD AND NECK TECHNIQUE: Multidetector CT imaging of the head and neck was performed using the standard protocol during bolus administration of intravenous contrast. Multiplanar CT image reconstructions and MIPs were obtained to evaluate the vascular anatomy. Carotid stenosis measurements (when applicable) are obtained utilizing NASCET criteria, using the distal internal carotid diameter as the denominator. CONTRAST:  52mL OMNIPAQUE IOHEXOL 350 MG/ML SOLN COMPARISON:  None. FINDINGS: CTA NECK FINDINGS Aortic arch: Standard 3 vessel aortic arch with widely patent arch vessel origins. Right carotid system: Patent without evidence of stenosis or dissection. Retropharyngeal course of the proximal ICA. Left carotid system: Patent with minimal calcified plaque at the carotid bifurcation. No evidence of stenosis or dissection. Vertebral arteries: Patent without evidence of stenosis or dissection. Mildly dominant right vertebral artery. Skeleton: No acute osseous abnormality or suspicious osseous lesion. Other neck: No evidence of cervical lymphadenopathy or mass. Upper chest: Clear lung apices. Review of the MIP images confirms the above findings CTA HEAD FINDINGS Anterior circulation: The internal carotid arteries are patent from skull base to carotid termini with small volume calcified plaque on the right not resulting in significant stenosis. ACAs and MCAs are patent without evidence of proximal branch occlusion or significant M1 stenosis. Diffusely irregular appearance of the small and  medium-sized vessels as well as the A1 segments on thick MIPs is felt to be artifactual related to image noise and suboptimal arterial opacification. No aneurysm is identified. Posterior circulation: The intracranial vertebral arteries are widely patent to the basilar. The basilar artery is widely patent. Posterior communicating arteries are not clearly identified and may be diminutive or absent. Both PCAs are patent without definite flow limiting proximal stenosis. No aneurysm is identified. Venous sinuses: Patent. Anatomic variants: None. Review of the MIP images confirms the above findings IMPRESSION: Minimal atherosclerosis in the head and neck without large vessel occlusion or flow limiting proximal stenosis. Electronically Signed   By: Logan Bores M.D.   On: 07/02/2019 08:24   ECHOCARDIOGRAM COMPLETE  Result Date: 07/02/2019    ECHOCARDIOGRAM REPORT   Patient Name:   Alison Wilkinson Date of Exam: 07/02/2019 Medical Rec #:  [id number]       Height:       66.0 in Accession #:    [id number]      Weight:       263.0 lb Date of Birth:  Aug 20, 1970      BSA:          2.247 m Patient Age:    49 years        BP:           129/81 mmHg Patient Gender: F  HR:           65 bpm. Exam Location:  Inpatient Procedure: 2D Echo                                 MODIFIED REPORT: This report was modified by Eleonore Chiquito MD on 07/02/2019 due to conclusions now                                      added.  Indications:     Stroke $34.91  History:         Patient has prior history of Echocardiogram examinations, most                  recent 05/26/2018. Pacemaker, COPD, Arrythmias:Atrial                  Fibrillation, Signs/Symptoms:Syncope; Risk                  Factors:Hypertension.  Sonographer:     Mikki Santee RDCS (AE) Referring Phys:  [place] Diagnosing Phys: Eleonore Chiquito MD IMPRESSIONS  1. Left ventricular ejection fraction, by estimation, is 55 to 60%. The left ventricle has normal function. The  left ventricle has no regional wall motion abnormalities. Left ventricular diastolic parameters are consistent with Grade I diastolic dysfunction (impaired relaxation).  2. Right ventricular systolic function is normal. The right ventricular size is normal. There is normal pulmonary artery systolic pressure. The estimated right ventricular systolic pressure is 0000000 mmHg.  3. The mitral valve is grossly normal. No evidence of mitral valve regurgitation. No evidence of mitral stenosis.  4. The aortic valve is tricuspid. Aortic valve regurgitation is not visualized. No aortic stenosis is present.  5. The inferior vena cava is dilated in size with >50% respiratory variability, suggesting right atrial pressure of 8 mmHg. Comparison(s): A prior study was performed on 05/26/2018. Prior images unable to be directly viewed, comparison made by report only. No significant change from prior study. Conclusion(s)/Recommendation(s): No intracardiac source of embolism detected on this transthoracic study. A transesophageal echocardiogram is recommended to exclude cardiac source of embolism if clinically indicated. FINDINGS  Left Ventricle: Left ventricular ejection fraction, by estimation, is 55 to 60%. The left ventricle has normal function. The left ventricle has no regional wall motion abnormalities. The left ventricular internal cavity size was normal in size. There is  no left ventricular hypertrophy. Left ventricular diastolic parameters are consistent with Grade I diastolic dysfunction (impaired relaxation). Right Ventricle: The right ventricular size is normal. No increase in right ventricular wall thickness. Right ventricular systolic function is normal. There is normal pulmonary artery systolic pressure. The tricuspid regurgitant velocity is 2.00 m/s, and  with an assumed right atrial pressure of 8 mmHg, the estimated right ventricular systolic pressure is 0000000 mmHg. Left Atrium: Left atrial size was normal in size. Right  Atrium: Right atrial size was normal in size. Pericardium: There is no evidence of pericardial effusion. Presence of pericardial fat pad. Mitral Valve: The mitral valve is grossly normal. No evidence of mitral valve regurgitation. No evidence of mitral valve stenosis. Tricuspid Valve: The tricuspid valve is grossly normal. Tricuspid valve regurgitation is mild . No evidence of tricuspid stenosis. Aortic Valve: The aortic valve is tricuspid. Aortic valve regurgitation is not visualized. No aortic stenosis is present. Pulmonic Valve: The  pulmonic valve was grossly normal. Pulmonic valve regurgitation is not visualized. No evidence of pulmonic stenosis. Aorta: The aortic root and ascending aorta are structurally normal, with no evidence of dilitation. Venous: The inferior vena cava is dilated in size with greater than 50% respiratory variability, suggesting right atrial pressure of 8 mmHg. IAS/Shunts: No atrial level shunt detected by color flow Doppler. Additional Comments: A pacer wire is visualized in the right atrium and right ventricle.  LEFT VENTRICLE PLAX 2D LVIDd:         5.30 cm  Diastology LVIDs:         3.70 cm  LV e' lateral:   9.57 cm/s LV PW:         1.00 cm  LV E/e' lateral: 6.3 LV IVS:        0.90 cm  LV e' medial:    7.72 cm/s LVOT diam:     2.30 cm  LV E/e' medial:  7.8 LV SV:         68 LV SV Index:   30 LVOT Area:     4.15 cm  RIGHT VENTRICLE RV S prime:     9.79 cm/s TAPSE (M-mode): 1.8 cm LEFT ATRIUM             Index       RIGHT ATRIUM           Index LA diam:        4.00 cm 1.78 cm/m  RA Area:     12.20 cm LA Vol (A2C):   60.7 ml 27.02 ml/m RA Volume:   24.00 ml  10.68 ml/m LA Vol (A4C):   50.9 ml 22.66 ml/m LA Biplane Vol: 60.6 ml 26.97 ml/m  AORTIC VALVE LVOT Vmax:   69.00 cm/s LVOT Vmean:  47.600 cm/s LVOT VTI:    0.164 m  AORTA Ao Root diam: 3.50 cm MITRAL VALVE               TRICUSPID VALVE MV Area (PHT): 3.72 cm    TR Peak grad:   16.0 mmHg MV Decel Time: 204 msec    TR Vmax:         200.00 cm/s MV E velocity: 60.20 cm/s MV A velocity: 62.90 cm/s  SHUNTS MV E/A ratio:  0.96        Systemic VTI:  0.16 m                            Systemic Diam: 2.30 cm Eleonore Chiquito MD Electronically signed by Eleonore Chiquito MD Signature Date/Time: 07/02/2019/11:30:14 AM    Final (Updated)     PHYSICAL EXAM  Temp:  [97.6 F (36.4 C)-98.8 F (37.1 C)] 97.6 F (36.4 C) (04/05 1300) Pulse Rate:  [59-69] 67 (04/05 1300) Resp:  [14-20] 19 (04/05 1300) BP: (109-143)/(61-86) 143/86 (04/05 1300) SpO2:  [96 %-100 %] 100 % (04/05 1300)  General - obese, well developed, in no apparent distress.  Ophthalmologic - fundi not visualized due to noncooperation.  Cardiovascular - Regular rhythm and rate, not in afib.  Mental Status -  Level of arousal and orientation to time, place, and person were intact. Language including expression, naming, repetition, comprehension was assessed and found intact. Fund of Knowledge was assessed and was intact.  Cranial Nerves II - XII - II - Visual field intact OU. III, IV, VI - Extraocular movements intact. V - Facial sensation intact bilaterally. VII - Facial movement intact  bilaterally. VIII - Hearing & vestibular intact bilaterally. X - Palate elevates symmetrically. XI - Chin turning & shoulder shrug intact bilaterally. XII - Tongue protrusion intact.  Motor Strength - The patient's strength was normal in all extremities and pronator drift was absent.  Bulk was normal and fasciculations were absent.   Motor Tone - Muscle tone was assessed at the neck and appendages and was normal.  Reflexes - The patient's reflexes were symmetrical in all extremities and she had no pathological reflexes.  Sensory - Light touch, temperature/pinprick were assessed and were symmetrical.    Coordination - The patient had normal movements in the hands and feet with no ataxia or dysmetria.  Tremor was absent.  Gait and Station - deferred.   ASSESSMENT/PLAN Alison Wilkinson is a 49 y.o. female with history of AF off AC, HTN, HLD, morbid obesity, prior smoker w/ self reported hx stroke presenting with severdal days of L sided weakness, tingling/numbness and paresthesias of hands and feet.   TIA likely due to afib not on [facility]  CT head No acute abnormality.   CTA head & neck minimal atherosclerosis w/o LVO or significant stenosis   2D Echo EF 55-60%. No source of embolus   LDL 174  HgbA1c 5.9   Eliquis for VTE prophylaxis  aspirin 81 mg daily prior to admission, now on Eliquis (apixaban) daily.  Continue Eliquis on discharge  Therapy recommendations:  OP OT, no PT  Disposition:  Return home  Atrial Fibrillation  Home anticoagulation:  none   Was on Coumadin in the past . Now on  Eliquis (apixaban) daily, continue at discharge . Currently NSR   Hypertension  Stable . Permissive hypertension (OK if <180/105) but gradually normalize in 2-3 days . Long-term BP goal normotensive  Hyperlipidemia  Home meds:  No statin  Now on lipitor 40  LDL 174, goal < 70  Continue statin at discharge  Other Stroke Risk Factors  Former Cigarette smoker, quit 2 yrs ago  Former smokeless tobacco user  Morbid Obesity, Body mass index is 42.45 kg/m., recommend weight loss, diet and exercise as appropriate   Hx stroke/TIA  Self reported. No details available in [facility].    Family hx stroke (father)  Cardiomyopathy   SSS w/ pacer  Other Active Problems  GERD  Anxiety. Pt requests counselor.  Hypothyroidism on synthroid  Hx COPD  Hospital day # 1  Neurology will sign off. Please call with questions. Pt will follow up with stroke clinic NP at [facility] in about 4 weeks. Thanks for the consult.  Rosalin Hawking, MD PhD Stroke Neurology 07/02/2019 3:33 PM   To contact Stroke Continuity provider, please refer to [email or web address] After hours, contact General Neurology

## 2019-07-02 NOTE — Progress Notes (Signed)
OT Treatment Note    07/02/19 1600  OT Visit Information  Last OT Received On 07/02/19  Assistance Needed +1  History of Present Illness 49 y.o. female with a history of atrial fibrillation and pacemaker who used to be on anticoagulation but has not been for quite some time as well as a self-reported history of stroke who presents with left-sided weakness that has been going on for several days. She initially got some tingling in her left foot then gradually spread to her left hand over the course of a couple of days.  She also has left-sided weakness.  This is similar to what happened with her previous stroke.  Pain Assessment  Pain Assessment No/denies pain  Cognition  Arousal/Alertness Awake/alert  Behavior During Therapy WFL for tasks assessed/performed  General Comments  General comments (skin integrity, edema, etc.) Pt educated on strateiges for anxiety adn stress managemetn using grounding techniques in addition to imagery and deep breathing/muscle relaxation.   OT - End of Session  Activity Tolerance Patient tolerated treatment well  Patient left Other (comment) (pt sitting on couch)  Nurse Communication Other (comment) (Pt asking about work form)  OT Assessment/Plan  OT Plan Discharge plan remains appropriate  OT Visit Diagnosis Muscle weakness (generalized) (M62.81);Low vision, both eyes (H54.2);Other symptoms and signs involving cognitive function  OT Frequency (ACUTE ONLY) Min 2X/week  Follow Up Recommendations Outpatient OT  OT Equipment None recommended by OT  AM-PAC OT "6 Clicks" Daily Activity Outcome Measure (Version 2)  Help from another person eating meals? 4  Help from another person taking care of personal grooming? 4  Help from another person toileting, which includes using toliet, bedpan, or urinal? 4  Help from another person bathing (including washing, rinsing, drying)? 4  Help from another person to put on and taking off regular upper body clothing? 4  Help  from another person to put on and taking off regular lower body clothing? 4  6 Click Score 24  OT Goal Progression  Progress towards OT goals Goals met/education completed, patient discharged from OT (further OT to be addressed in outpt clinic)  Acute Rehab OT Goals  Patient Stated Goal to go home  OT Goal Formulation With patient  Time For Goal Achievement 07/16/19  Potential to Achieve Goals Good  ADL Goals  Additional ADL Goal #1 Pt will independently verbalize 3 coping strategies to manage anxiety  Additional ADL Goal #2 Pt will verbalize 2 strategies to compensate for reduced sensation  OT Time Calculation  OT Start Time (ACUTE ONLY) 1405  OT Stop Time (ACUTE ONLY) 1418  OT Time Calculation (min) 13 min  OT General Charges  $OT Visit 1 Visit  OT Treatments  $Therapeutic Activity 8-22 mins  Maurie Boettcher, OT/L   Acute OT Clinical Specialist [place] Pager [phone] Office [phone]

## 2019-07-04 ENCOUNTER — Other Ambulatory Visit: Payer: Self-pay | Admitting: Orthopedic Surgery

## 2019-07-04 DIAGNOSIS — M25562 Pain in left knee: Secondary | ICD-10-CM

## 2019-07-06 DIAGNOSIS — C73 Malignant neoplasm of thyroid gland: Secondary | ICD-10-CM | POA: Diagnosis not present

## 2019-07-06 DIAGNOSIS — E890 Postprocedural hypothyroidism: Secondary | ICD-10-CM | POA: Diagnosis not present

## 2019-07-06 DIAGNOSIS — R7303 Prediabetes: Secondary | ICD-10-CM | POA: Diagnosis not present

## 2019-07-27 ENCOUNTER — Inpatient Hospital Stay: Admission: RE | Admit: 2019-07-27 | Payer: Medicaid Other | Source: Ambulatory Visit

## 2019-07-27 ENCOUNTER — Other Ambulatory Visit: Payer: Self-pay

## 2019-07-27 ENCOUNTER — Other Ambulatory Visit: Payer: Medicaid Other

## 2019-07-27 ENCOUNTER — Ambulatory Visit
Admission: RE | Admit: 2019-07-27 | Discharge: 2019-07-27 | Disposition: A | Discharge status: Home / Self Care | Payer: Medicaid Other | Source: Ambulatory Visit | Attending: Orthopedic Surgery | Admitting: Orthopedic Surgery

## 2019-07-27 DIAGNOSIS — M25562 Pain in left knee: Secondary | ICD-10-CM | POA: Diagnosis not present

## 2019-07-27 MED ORDER — IOPAMIDOL (ISOVUE-M 200) INJECTION 41%
40.0000 mL | Freq: Once | INTRAMUSCULAR | Status: AC
  Administered 2019-07-27: 40 mL via INTRA_ARTICULAR

## 2019-08-01 ENCOUNTER — Ambulatory Visit: Payer: Medicaid Other | Admitting: Family Medicine

## 2019-08-02 ENCOUNTER — Other Ambulatory Visit: Payer: Self-pay | Admitting: *Deleted

## 2019-08-02 DIAGNOSIS — E559 Vitamin D deficiency, unspecified: Secondary | ICD-10-CM

## 2019-08-02 MED ORDER — ERGOCALCIFEROL 1.25 MG (50000 UT) PO CAPS: 50000.0000 [IU] | ORAL_CAPSULE | ORAL | 0 refills | Status: AC

## 2019-08-03 DIAGNOSIS — M25562 Pain in left knee: Secondary | ICD-10-CM | POA: Diagnosis not present

## 2019-08-15 ENCOUNTER — Inpatient Hospital Stay: Payer: Medicaid Other | Admitting: Adult Health

## 2019-08-15 ENCOUNTER — Encounter: Payer: Self-pay | Admitting: Adult Health

## 2019-08-15 NOTE — Progress Notes (Deleted)
[facility] [address] [place]. [place] [address] [phone]       HOSPITAL FOLLOW UP NOTE  [name] Date of Birth:  11/22/1970 Medical Record Number:  [id number]   Reason for Referral:  hospital stroke follow up    SUBJECTIVE:   CHIEF COMPLAINT:  No chief complaint on file.   HPI:    [name] is a 49 y.o. female with history of AF off AC, HTN, HLD, morbid obesity, prior smoker w/ self reported hx stroke  who presented on 07/01/2019 with severdal days of L sided weakness, tingling/numbness and paresthesias of hands and feet. Evaluated by stroke team with stroke work-up largely unremarkable and symptoms likely TIA likely due to A. fib not on AC.  Initiated Eliquis for secondary stroke prevention and history of atrial fibrillation.  History of HTN stable.  LDL 174 and initiated atorvastatin 40 mg daily.  Other stroke risk factors include former tobacco use, former smokeless tobacco user, morbid obesity, self-reported history of stroke, family history of stroke, cardiomyopathy and SSS w/ pacer.  Other active problems include GERD, anxiety, thyroid cancer s/p thyroidectomy 03/2015 and history of COPD.  Evaluated by therapies and discharged home with recommendation of outpatient OT.  TIA likely due to afib not on [facility]  CT head No acute abnormality.   CTA head & neck minimal atherosclerosis w/o LVO or significant stenosis   2D Echo EF 55-60%. No source of embolus   LDL 174  HgbA1c 5.9   Eliquis for VTE prophylaxis  aspirin 81 mg daily prior to admission, now on Eliquis (apixaban) daily.  Continue Eliquis on discharge  Therapy recommendations:  OP OT, no PT  Disposition:  Return home         ROS:   14 system review of systems performed and negative with exception of ***  PMH:  Past Medical History:  Diagnosis Date  . A-fib ([place])   . Anxiety   . Atrial tachycardia ([place])   . Cancer ([place])    thyroid  . Cardiomyopathy  ([place])   . COPD (chronic obstructive pulmonary disease) ([place])   . Dyspnea   . Dysrhythmia   . GERD (gastroesophageal reflux disease)   . Headache   . High cholesterol   . History of kidney stones   . Hypertension   . Hyperthyroidism   . Hypothyroid   . Mixed hyperlipidemia   . Pacemaker    biotronic  PPM   DR. [place]   . Pacemaker   . Pneumonia    hx  . PONV (postoperative nausea and vomiting)   . Shortness of breath dyspnea    WITH EXERTION   . Sleep apnea    mild case no cpap reccommended  . SSS (sick sinus syndrome) ([place])   . Stroke [facility])    2009   afib  (none in years)  . Syncope     PSH:  Past Surgical History:  Procedure Laterality Date  . CARDIAC CATHETERIZATION     8/16  . HYSTEROSCOPY  2015  . MULTIPLE EXTRACTIONS WITH ALVEOLOPLASTY Bilateral 08/13/2016   Procedure: MULTIPLE EXTRACTIONS;  Surgeon: Diona Browner, DDS;  Location: [facility];  Service: Oral Surgery;  Laterality: Bilateral;  . PACEMAKER INSERTION  2008  . THYROIDECTOMY Bilateral 04/25/2015   Procedure: THYROIDECTOMY;  Surgeon: Melida Quitter, MD;  Location: [place];  Service: ENT;  Laterality: Bilateral;  TOTAL THYROIDECTOMY  . TUMOR REMOVAL  2013   UTERUS  Social History:  Social History   Socioeconomic History  . Marital status: Single    Spouse name: Not on file  . Number of children: 1  . Years of education: Not on file  . Highest education level: Not on file  Occupational History  . Occupation: Stay at home  Tobacco Use  . Smoking status: Former Smoker    Packs/day: 0.50    Years: 35.00    Pack years: 17.50    Types: Cigarettes    Quit date: 12/20/2016    Years since quitting: 2.6  . Smokeless tobacco: Former Network engineer and Sexual Activity  . Alcohol use: No    Comment: NONE IN 6 YEARS  . Drug use: No    Types: Marijuana    Comment: OVER 1 YEAR  . Sexual activity: Not Currently    Birth control/protection: None  Other Topics Concern  . Not on file  Social  History Narrative  . Not on file   Social Determinants of Health   Financial Resource Strain:   . Difficulty of Paying Living Expenses:   Food Insecurity:   . Worried About Charity fundraiser in the Last Year:   . Arboriculturist in the Last Year:   Transportation Needs:   . Film/video editor (Medical):   [name] Lack of Transportation (Non-Medical):   Physical Activity:   . Days of Exercise per Week:   . Minutes of Exercise per Session:   Stress:   . Feeling of Stress :   Social Connections:   . Frequency of Communication with Friends and Family:   . Frequency of Social Gatherings with Friends and Family:   . Attends Religious Services:   . Active Member of Clubs or Organizations:   . Attends Archivist Meetings:   [name] Marital Status:   Intimate Partner Violence:   . Fear of Current or Ex-Partner:   . Emotionally Abused:   [name] Physically Abused:   . Sexually Abused:     Family History:  Family History  Problem Relation Age of Onset  . Diabetes Mother   . Hyperlipidemia Mother   . Hypertension Mother   . Depression Mother   . Heart disease Mother   . Bipolar disorder Mother   . Liver disease Mother   . Sleep apnea Mother   . Obesity Mother   . Hypertension Father   . Stroke Father   . Heart disease Father   . Thyroid disease Father   . Alcoholism Father   . Obesity Father   . Hyperlipidemia Sister   . Hypertension Sister   . Thyroid disease Sister   . Hypertension Daughter   . Mental illness Daughter     Medications:   Current Outpatient Medications on File Prior to Visit  Medication Sig Dispense Refill  . acetaminophen (TYLENOL) 500 MG tablet Take 1,000 mg by mouth every 6 (six) hours as needed.    [name] albuterol (PROVENTIL HFA;VENTOLIN HFA) 108 (90 Base) MCG/ACT inhaler Inhale 1-2 puffs into the lungs every 6 (six) hours as needed for wheezing or shortness of breath.    [name] albuterol (PROVENTIL) (2.5 MG/3ML) 0.083% nebulizer solution Take 3 mLs (2.5 mg  total) by nebulization every 6 (six) hours as needed for wheezing or shortness of breath. 150 mL 1  . apixaban (ELIQUIS) 5 MG TABS tablet Take 1 tablet (5 mg total) by mouth 2 (two) times daily. 180 tablet 1  . atorvastatin (LIPITOR) 40 MG tablet  Take 1 tablet (40 mg total) by mouth daily at 6 PM. 90 tablet 1  . clonazePAM (KLONOPIN) 0.5 MG tablet Take 0.5 mg by mouth daily as needed for anxiety.     . ergocalciferol (VITAMIN D2) 1.25 MG (50000 UT) capsule Take 1 capsule (50,000 Units total) by mouth once a week for 30 doses. 30 capsule 0  . gabapentin (NEURONTIN) 300 MG capsule Take 1 capsule (300 mg total) by mouth at bedtime. 90 capsule 1  . HYDROcodone-acetaminophen (NORCO/VICODIN) 5-325 MG tablet Take 1 tablet by mouth every 6 (six) hours as needed for moderate pain. 30 tablet 0  . levothyroxine (SYNTHROID) 112 MCG tablet Take 112 mcg by mouth daily before breakfast.    . metoprolol succinate (TOPROL-XL) 25 MG 24 hr tablet Take 25 mg by mouth daily.     [name] omeprazole (PRILOSEC) 40 MG capsule Take 40 mg by mouth 2 (two) times daily.    . vitamin B-12 1000 MCG tablet Take 1 tablet (1,000 mcg total) by mouth daily. 90 tablet 1   No current facility-administered medications on file prior to visit.    Allergies:   Allergies  Allergen Reactions  . Carvedilol Palpitations and Shortness Of Breath    Per pt report Per pt report Per pt report Per pt report Per pt report  . Lisinopril Shortness Of Breath  . Sulfa Antibiotics Nausea Only, Other (See Comments) and Nausea And Vomiting    Headache  Headache  Headache  Headache  Headache        OBJECTIVE:  Physical Exam  There were no vitals filed for this visit. There is no height or weight on file to calculate BMI. No exam data present  Depression screen [facility] 2/9 05/02/2019  Decreased Interest 0  Down, Depressed, Hopeless 0  PHQ - 2 Score 0  Altered sleeping -  Tired, decreased energy -  Change in appetite -  Feeling bad or  failure about yourself  -  Trouble concentrating -  Moving slowly or fidgety/restless -  Suicidal thoughts -  PHQ-9 Score -  Difficult doing work/chores -     General: well developed, well nourished, seated, in no evident distress Head: head normocephalic and atraumatic.   Neck: supple with no carotid or supraclavicular bruits Cardiovascular: regular rate and rhythm, no murmurs Musculoskeletal: no deformity Skin:  no rash/petichiae Vascular:  Normal pulses all extremities   Neurologic Exam Mental Status: Awake and fully alert. Oriented to place and time. Recent and remote memory intact. Attention span, concentration and fund of knowledge appropriate. Mood and affect appropriate.  Cranial Nerves: Fundoscopic exam reveals sharp disc margins. Pupils equal, briskly reactive to light. Extraocular movements full without nystagmus. Visual fields full to confrontation. Hearing intact. Facial sensation intact. Face, tongue, palate moves normally and symmetrically.  Motor: Normal bulk and tone. Normal strength in all tested extremity muscles. Sensory.: intact to touch , pinprick , position and vibratory sensation.  Coordination: Rapid alternating movements normal in all extremities. Finger-to-nose and heel-to-shin performed accurately bilaterally. Gait and Station: Arises from chair without difficulty. Stance is normal. Gait demonstrates normal stride length and balance Reflexes: 1+ and symmetric. Toes downgoing.     NIHSS  *** Modified Rankin  *** CHA2DS2-VASc *** HAS-BLED ***     ASSESSMENT: Alison Wilkinson is a 49 y.o. year old female presented with several days of left-sided weakness, tingling/numbness and paresthesias of hands on 07/01/2019 diagnosed with TIA likely due to A. fib not on AC. Vascular risk factors include  HTN, HLD, A. fib off AC, cardiomyopathy, patient reported stroke history and SSS w/ pacer.      PLAN:  1. TIA: Continue Eliquis (apixaban) daily  and atorvastatin  40 mg daily for secondary stroke prevention. Maintain strict control of hypertension with blood pressure goal below 130/90, diabetes with hemoglobin A1c goal below 6.5% and cholesterol with LDL cholesterol (bad cholesterol) goal below 70 mg/dL.  I also advised the patient to eat a healthy diet with plenty of whole grains, cereals, fruits and vegetables, exercise regularly with at least 30 minutes of continuous activity daily and maintain ideal body weight. 2. Atrial fibrillation: 3. HTN: Advised to continue current treatment regimen.  Today's BP ***.  Advised to continue to monitor at home along with continued follow-up with PCP for management 4. HLD: Advised to continue current treatment regimen along with continued follow-up with PCP for future prescribing and monitoring of lipid panel     Follow up in *** or call earlier if needed   I spent *** minutes of face-to-face and non-face-to-face time with patient.  This included previsit chart review, lab review, study review, order entry, electronic health record documentation, patient education regarding recent stroke, residual deficits, importance of managing stroke risk factors and answered all questions to patient satisfaction     Frann Rider, [facility]  [facility] [address] [place],  [address]  Phone [phone] Fax [phone] Note: This document was prepared with digital dictation and possible smart phrase technology. Any transcriptional errors that result from this process are unintentional.

## 2019-09-17 DIAGNOSIS — I495 Sick sinus syndrome: Secondary | ICD-10-CM | POA: Diagnosis not present

## 2019-09-23 DIAGNOSIS — M436 Torticollis: Secondary | ICD-10-CM | POA: Diagnosis not present

## 2019-09-23 DIAGNOSIS — M542 Cervicalgia: Secondary | ICD-10-CM | POA: Diagnosis not present

## 2019-09-25 DIAGNOSIS — E079 Disorder of thyroid, unspecified: Secondary | ICD-10-CM | POA: Diagnosis not present

## 2019-09-25 DIAGNOSIS — M549 Dorsalgia, unspecified: Secondary | ICD-10-CM | POA: Diagnosis not present

## 2019-09-25 DIAGNOSIS — M47812 Spondylosis without myelopathy or radiculopathy, cervical region: Secondary | ICD-10-CM | POA: Diagnosis not present

## 2019-09-25 DIAGNOSIS — R519 Headache, unspecified: Secondary | ICD-10-CM | POA: Diagnosis not present

## 2019-09-25 DIAGNOSIS — M47892 Other spondylosis, cervical region: Secondary | ICD-10-CM | POA: Diagnosis not present

## 2019-09-25 DIAGNOSIS — Z87891 Personal history of nicotine dependence: Secondary | ICD-10-CM | POA: Diagnosis not present

## 2019-09-25 DIAGNOSIS — G8929 Other chronic pain: Secondary | ICD-10-CM | POA: Diagnosis not present

## 2019-09-25 DIAGNOSIS — M47896 Other spondylosis, lumbar region: Secondary | ICD-10-CM | POA: Diagnosis not present

## 2019-09-25 DIAGNOSIS — Z950 Presence of cardiac pacemaker: Secondary | ICD-10-CM | POA: Diagnosis not present

## 2019-09-25 DIAGNOSIS — M542 Cervicalgia: Secondary | ICD-10-CM | POA: Diagnosis not present

## 2019-09-25 DIAGNOSIS — Z823 Family history of stroke: Secondary | ICD-10-CM | POA: Diagnosis not present

## 2019-09-25 DIAGNOSIS — K219 Gastro-esophageal reflux disease without esophagitis: Secondary | ICD-10-CM | POA: Diagnosis not present

## 2019-09-25 DIAGNOSIS — Z8673 Personal history of transient ischemic attack (TIA), and cerebral infarction without residual deficits: Secondary | ICD-10-CM | POA: Diagnosis not present

## 2019-09-25 DIAGNOSIS — J449 Chronic obstructive pulmonary disease, unspecified: Secondary | ICD-10-CM | POA: Diagnosis not present

## 2019-09-25 DIAGNOSIS — M47816 Spondylosis without myelopathy or radiculopathy, lumbar region: Secondary | ICD-10-CM | POA: Diagnosis not present

## 2019-09-25 DIAGNOSIS — I4891 Unspecified atrial fibrillation: Secondary | ICD-10-CM | POA: Diagnosis not present

## 2019-09-25 DIAGNOSIS — E119 Type 2 diabetes mellitus without complications: Secondary | ICD-10-CM | POA: Diagnosis not present

## 2019-09-25 DIAGNOSIS — E785 Hyperlipidemia, unspecified: Secondary | ICD-10-CM | POA: Diagnosis not present

## 2019-09-25 DIAGNOSIS — M5137 Other intervertebral disc degeneration, lumbosacral region: Secondary | ICD-10-CM | POA: Diagnosis not present

## 2019-09-25 DIAGNOSIS — I10 Essential (primary) hypertension: Secondary | ICD-10-CM | POA: Diagnosis not present

## 2019-11-16 DIAGNOSIS — J020 Streptococcal pharyngitis: Secondary | ICD-10-CM | POA: Diagnosis not present

## 2019-11-16 DIAGNOSIS — R05 Cough: Secondary | ICD-10-CM | POA: Diagnosis not present

## 2019-11-16 DIAGNOSIS — J029 Acute pharyngitis, unspecified: Secondary | ICD-10-CM | POA: Diagnosis not present

## 2020-01-22 ENCOUNTER — Other Ambulatory Visit: Payer: Self-pay | Admitting: *Deleted

## 2020-01-22 NOTE — Patient Outreach (Signed)
Care Coordination  01/22/2020  ALVERA TOURIGNY [birth year] [id number]   An unsuccessful telephone outreach was attempted today. The patient was referred to the case management team for assistance with care management and care coordination.   Follow Up Plan: A HIPAA compliant phone message was left for the patient providing contact information and requesting a return call.  The Managed Medicaid care management team will reach out to the patient again over the next 7-14 days.   Lurena Joiner RN, BSN [place]  [employer] manager

## 2020-01-22 NOTE — Patient Instructions (Signed)
Visit Information  Ms. [name]  - as a part of your Medicaid benefit, you are eligible for care management and care coordination services at no cost or copay. I was unable to reach you by phone today but would be happy to help you with your health related needs. Please feel free to call me @ [phone].   A member of the Managed Medicaid care management team will reach out to you again over the next 7-14 days.   Lurena Joiner RN, BSN [place]  [employer] manager

## 2020-02-04 ENCOUNTER — Other Ambulatory Visit: Payer: Self-pay

## 2020-02-04 ENCOUNTER — Other Ambulatory Visit: Payer: Self-pay | Admitting: *Deleted

## 2020-02-04 NOTE — Patient Instructions (Signed)
Thank you for taking time to speak with me today about care coordination and care management services available to you at no cost as part of your Medicaid benefit. These services are voluntary. Our team is available to provide assistance regarding your health care needs at any time. Please do not hesitate to reach out to me if we can be of service to you at any time in the future.   [place] Welcome RN, BSN [place]  [facility] RN Care Coordinator  

## 2020-02-04 NOTE — Patient Outreach (Signed)
Care Coordination  02/04/2020  PUNEET SELDEN 1970/09/11 [id number]  Ms. Kable was contacted today as a benefit of Managed Medicaid for case management and care coordination. Ms. Dreier no longer has Medicaid benefits. She has a different plan for medical coverage at this time. Ms. Chirico will be taken off of the HR MM care team schedule.  Lurena Joiner RN, BSN [place]  [employer] manager

## 2020-08-14 DIAGNOSIS — M5416 Radiculopathy, lumbar region: Secondary | ICD-10-CM | POA: Diagnosis not present

## 2020-08-14 DIAGNOSIS — M48061 Spinal stenosis, lumbar region without neurogenic claudication: Secondary | ICD-10-CM | POA: Diagnosis not present

## 2021-01-19 DIAGNOSIS — I495 Sick sinus syndrome: Secondary | ICD-10-CM | POA: Diagnosis not present

## 2021-02-01 DIAGNOSIS — R059 Cough, unspecified: Secondary | ICD-10-CM | POA: Diagnosis not present

## 2021-02-01 DIAGNOSIS — J40 Bronchitis, not specified as acute or chronic: Secondary | ICD-10-CM | POA: Diagnosis not present

## 2021-02-20 DIAGNOSIS — R309 Painful micturition, unspecified: Secondary | ICD-10-CM | POA: Diagnosis not present

## 2021-02-20 DIAGNOSIS — N3001 Acute cystitis with hematuria: Secondary | ICD-10-CM | POA: Diagnosis not present

## 2021-03-17 DIAGNOSIS — N390 Urinary tract infection, site not specified: Secondary | ICD-10-CM | POA: Diagnosis not present

## 2021-03-17 DIAGNOSIS — R42 Dizziness and giddiness: Secondary | ICD-10-CM | POA: Diagnosis not present

## 2021-03-17 DIAGNOSIS — Z87891 Personal history of nicotine dependence: Secondary | ICD-10-CM | POA: Diagnosis not present

## 2021-03-18 DIAGNOSIS — R519 Headache, unspecified: Secondary | ICD-10-CM | POA: Diagnosis not present

## 2021-03-18 DIAGNOSIS — R110 Nausea: Secondary | ICD-10-CM | POA: Diagnosis not present

## 2021-03-18 DIAGNOSIS — R42 Dizziness and giddiness: Secondary | ICD-10-CM | POA: Diagnosis not present

## 2021-03-30 DIAGNOSIS — D7389 Other diseases of spleen: Secondary | ICD-10-CM | POA: Diagnosis not present

## 2021-03-30 DIAGNOSIS — I4891 Unspecified atrial fibrillation: Secondary | ICD-10-CM | POA: Diagnosis not present

## 2021-03-30 DIAGNOSIS — K219 Gastro-esophageal reflux disease without esophagitis: Secondary | ICD-10-CM | POA: Diagnosis not present

## 2021-03-30 DIAGNOSIS — Z882 Allergy status to sulfonamides status: Secondary | ICD-10-CM | POA: Diagnosis not present

## 2021-03-30 DIAGNOSIS — Z888 Allergy status to other drugs, medicaments and biological substances status: Secondary | ICD-10-CM | POA: Diagnosis not present

## 2021-03-30 DIAGNOSIS — N390 Urinary tract infection, site not specified: Secondary | ICD-10-CM | POA: Diagnosis not present

## 2021-03-30 DIAGNOSIS — Z950 Presence of cardiac pacemaker: Secondary | ICD-10-CM | POA: Diagnosis not present

## 2021-03-30 DIAGNOSIS — K449 Diaphragmatic hernia without obstruction or gangrene: Secondary | ICD-10-CM | POA: Diagnosis not present

## 2021-03-30 DIAGNOSIS — J449 Chronic obstructive pulmonary disease, unspecified: Secondary | ICD-10-CM | POA: Diagnosis not present

## 2021-03-30 DIAGNOSIS — I10 Essential (primary) hypertension: Secondary | ICD-10-CM | POA: Diagnosis not present

## 2021-03-30 DIAGNOSIS — Z79899 Other long term (current) drug therapy: Secondary | ICD-10-CM | POA: Diagnosis not present

## 2021-03-30 DIAGNOSIS — E119 Type 2 diabetes mellitus without complications: Secondary | ICD-10-CM | POA: Diagnosis not present

## 2021-03-30 DIAGNOSIS — I700 Atherosclerosis of aorta: Secondary | ICD-10-CM | POA: Diagnosis not present

## 2021-03-30 DIAGNOSIS — Z8673 Personal history of transient ischemic attack (TIA), and cerebral infarction without residual deficits: Secondary | ICD-10-CM | POA: Diagnosis not present

## 2021-03-30 DIAGNOSIS — Z87891 Personal history of nicotine dependence: Secondary | ICD-10-CM | POA: Diagnosis not present

## 2021-03-30 DIAGNOSIS — N200 Calculus of kidney: Secondary | ICD-10-CM | POA: Diagnosis not present

## 2021-03-30 DIAGNOSIS — R109 Unspecified abdominal pain: Secondary | ICD-10-CM | POA: Diagnosis not present

## 2021-03-30 DIAGNOSIS — K429 Umbilical hernia without obstruction or gangrene: Secondary | ICD-10-CM | POA: Diagnosis not present

## 2021-03-30 DIAGNOSIS — E785 Hyperlipidemia, unspecified: Secondary | ICD-10-CM | POA: Diagnosis not present

## 2021-03-30 DIAGNOSIS — E890 Postprocedural hypothyroidism: Secondary | ICD-10-CM | POA: Diagnosis not present

## 2021-04-01 ENCOUNTER — Other Ambulatory Visit: Payer: Self-pay

## 2021-04-01 ENCOUNTER — Emergency Department (HOSPITAL_COMMUNITY)
Admission: EM | Admit: 2021-04-01 | Discharge: 2021-04-01 | Disposition: A | Discharge status: Home / Self Care | Payer: Medicaid Other | Attending: Emergency Medicine | Admitting: Emergency Medicine

## 2021-04-01 ENCOUNTER — Encounter (HOSPITAL_COMMUNITY): Payer: Self-pay

## 2021-04-01 DIAGNOSIS — R339 Retention of urine, unspecified: Secondary | ICD-10-CM

## 2021-04-01 DIAGNOSIS — Z7901 Long term (current) use of anticoagulants: Secondary | ICD-10-CM | POA: Insufficient documentation

## 2021-04-01 DIAGNOSIS — R102 Pelvic and perineal pain: Secondary | ICD-10-CM | POA: Insufficient documentation

## 2021-04-01 LAB — URINALYSIS, ROUTINE W REFLEX MICROSCOPIC
Bilirubin Urine: NEGATIVE
Glucose, UA: NEGATIVE mg/dL
Ketones, ur: NEGATIVE mg/dL
Leukocytes,Ua: NEGATIVE
Nitrite: NEGATIVE
Protein, ur: NEGATIVE mg/dL
Specific Gravity, Urine: 1.004 — ABNORMAL LOW (ref 1.005–1.030)
pH: 6 (ref 5.0–8.0)

## 2021-04-01 NOTE — ED Notes (Signed)
Bladder scan showed approximately 386 ml of urine in bladder

## 2021-04-01 NOTE — ED Provider Notes (Signed)
[facility] EMERGENCY DEPARTMENT Provider Note   CSN: [id number] Arrival date & time: 04/01/21  1908     History  Chief Complaint  Patient presents with   Pelvic Pain    Alison Wilkinson is a 52 y.o. female here with c/o pelvic pressure and pain. Seen at [facility] 2 days ago who diagnosed with urinary tract infection and started on Keflex.  She has taken 5 total tablets.  She has a kidney stone in the kidney.  She feels intense pressure and sensation of incomplete emptying and has been straining to try to urinate to make more urine come out.  She feels like something is in her vagina. She has no other complaints no flank pain, no fevers.   Pelvic Pain      Home Medications Prior to Admission medications   Medication Sig Start Date End Date Taking? Authorizing Provider  acetaminophen (TYLENOL) 500 MG tablet Take 1,000 mg by mouth every 6 (six) hours as needed.    [provider]  albuterol (PROVENTIL HFA;VENTOLIN HFA) 108 (90 Base) MCG/ACT inhaler Inhale 1-2 puffs into the lungs every 6 (six) hours as needed for wheezing or shortness of breath.    [provider]  albuterol (PROVENTIL) (2.5 MG/3ML) 0.083% nebulizer solution Take 3 mLs (2.5 mg total) by nebulization every 6 (six) hours as needed for wheezing or shortness of breath. 04/19/16   Eustaquio Maize, MD  apixaban (ELIQUIS) 5 MG TABS tablet Take 1 tablet (5 mg total) by mouth 2 (two) times daily. 07/02/19   Mercy Riding, MD  atorvastatin (LIPITOR) 40 MG tablet Take 1 tablet (40 mg total) by mouth daily at 6 PM. 07/02/19   Gonfa, Charlesetta Ivory, MD  clonazePAM (KLONOPIN) 0.5 MG tablet Take 0.5 mg by mouth daily as needed for anxiety.     [provider]  gabapentin (NEURONTIN) 300 MG capsule Take 1 capsule (300 mg total) by mouth at bedtime. 07/02/19 12/29/19  Mercy Riding, MD  HYDROcodone-acetaminophen (NORCO/VICODIN) 5-325 MG tablet Take 1 tablet by mouth every 6 (six) hours as needed for moderate pain.  05/30/19   Baruch Gouty, FNP  levothyroxine (SYNTHROID) 112 MCG tablet Take 112 mcg by mouth daily before breakfast.    [provider]  metoprolol succinate (TOPROL-XL) 25 MG 24 hr tablet Take 25 mg by mouth daily.     [provider]  omeprazole (PRILOSEC) 40 MG capsule Take 40 mg by mouth 2 (two) times daily.    [provider]  vitamin B-12 1000 MCG tablet Take 1 tablet (1,000 mcg total) by mouth daily. 07/03/19   Mercy Riding, MD      Allergies    Carvedilol, Lisinopril, and Sulfa antibiotics    Review of Systems   Review of Systems  Genitourinary:  Positive for pelvic pain.   Physical Exam Updated Vital Signs BP (!) 179/91 (BP Location: Right Arm)    Pulse 76    Temp 98 F (36.7 C) (Oral)    Resp 19    Ht 5\' 5"  (1.651 m)    Wt 115.7 kg    SpO2 100%    BMI 42.43 kg/m  Physical Exam Vitals and nursing note reviewed.  Constitutional:      General: She is not in acute distress.    Appearance: She is well-developed. She is not diaphoretic.  HENT:     Head: Normocephalic and atraumatic.     Right Ear: External ear normal.  Left Ear: External ear normal.     Nose: Nose normal.     Mouth/Throat:     Mouth: Mucous membranes are moist.  Eyes:     General: No scleral icterus.    Conjunctiva/sclera: Conjunctivae normal.  Cardiovascular:     Rate and Rhythm: Normal rate and regular rhythm.     Heart sounds: Normal heart sounds. No murmur heard.   No friction rub. No gallop.  Pulmonary:     Effort: Pulmonary effort is normal. No respiratory distress.     Breath sounds: Normal breath sounds.  Abdominal:     General: Bowel sounds are normal. There is no distension.     Palpations: Abdomen is soft. There is no mass.     Tenderness: There is no abdominal tenderness. There is no guarding.  Genitourinary:    Comments: Pelvic exam: VULVA: normal appearing vulva with no masses, tenderness or lesions, VAGINA: Cystocele and rectocele noted, no dc CERVIX:  normal appearing cervix without discharge or lesions, exam limited by body habitus, exam chaperoned by Chalmers Guest  Musculoskeletal:     Cervical back: Normal range of motion.  Skin:    General: Skin is warm and dry.  Neurological:     Mental Status: She is alert and oriented to person, place, and time.  Psychiatric:        Behavior: Behavior normal.    ED Results / Procedures / Treatments   Labs (all labs ordered are listed, but only abnormal results are displayed) Labs Reviewed  URINALYSIS, ROUTINE W REFLEX MICROSCOPIC - Abnormal; Notable for the following components:      Result Value   Color, Urine STRAW (*)    APPearance HAZY (*)    Specific Gravity, Urine 1.004 (*)    Hgb urine dipstick MODERATE (*)    Bacteria, UA RARE (*)    All other components within normal limits  URINE CULTURE    EKG None  Radiology No results found.  Procedures BLADDER CATHETERIZATION  Date/Time: 04/02/2021 11:01 AM Performed by: Margarita Mail, PA-C Authorized by: Margarita Mail, PA-C   Consent:    Consent obtained:  Verbal   Consent given by:  Patient   Risks discussed:  False passage, infection, urethral injury, incomplete procedure and pain   Alternatives discussed:  No treatment Universal protocol:    Patient identity confirmed:  Verbally with patient Pre-procedure details:    Procedure purpose:  Therapeutic   Preparation: Patient was prepped and draped in usual sterile fashion   Procedure details:    Provider performed due to:  Nurse unavailable   Catheter insertion:  Temporary indwelling   Catheter type:  Foley   Catheter size:  14 Fr (14 Fr)   Bladder irrigation: no     Number of attempts:  1   Urine characteristics:  Dark and clear Post-procedure details:    Procedure completion:  Tolerated well, no immediate complications    Medications Ordered in ED Medications - No data to display  ED Course/ Medical Decision Making/ A&P                           Medical  Decision Making  Patient here with pelvic pressure and difficulty urinating This has been chronic- hx of constipation and difficulty urinating. Feeling of pelvic pressure and fullness esp when standing. Hx of recurrent UTIS- states this feels totally different. She is noted to have >378ml post void in her bladder on scan. Dx  includes obstruction, UTI, pelvic organ prolapse. Patient has no signs or sxs of cauda equina.  I reviewed the patient's work up done at [facility] 2 days ago UA negative Bmp- without renal abnormality and wnl UA negative CT scan with questionable perinephric stranding   I considered repeat CT but given recent findings this seemed unnecessary- I do not think the patient needs an MRI to r/u CE  I personally placed a foley cath with > 449ml clear urine return.  I suspect the patient is having obstruction due to pelvic organ prolapse.  I interpreted UA- No infection   Patient referred to Urology- Dr. Alyson Ingles for voiding trial.  Patient referred to Urogynecology Dr. Landry Mellow for further structural assessment.              Final Clinical Impression(s) / ED Diagnoses Final diagnoses:  None    Rx / DC Orders ED Discharge Orders     None         Margarita Mail, PA-C 04/02/21 1115    Milton Ferguson, MD 04/03/21 1144

## 2021-04-01 NOTE — Discharge Instructions (Signed)
Contact a health care provider if: You have uncomfortable bladder contractions that you cannot control (spasms). You leak urine with the spasms. Get help right away if: You have chills or a fever. You have blood in your urine. You have a catheter and the following happens: Your catheter stops draining urine. Your catheter falls out.

## 2021-04-01 NOTE — ED Triage Notes (Signed)
Patient states that she has a lot of pain and pressure in her pelvic area. Patient states that when she sits down to urinate only little urine come out.  Has hx of UTI.  Symptoms started couple days ago.

## 2021-04-03 LAB — URINE CULTURE
Culture: NO GROWTH
Special Requests: NORMAL

## 2021-04-06 DIAGNOSIS — N812 Incomplete uterovaginal prolapse: Secondary | ICD-10-CM | POA: Diagnosis not present

## 2021-04-06 DIAGNOSIS — N816 Rectocele: Secondary | ICD-10-CM | POA: Diagnosis not present

## 2021-04-06 DIAGNOSIS — K5900 Constipation, unspecified: Secondary | ICD-10-CM | POA: Diagnosis not present

## 2021-04-06 DIAGNOSIS — R339 Retention of urine, unspecified: Secondary | ICD-10-CM | POA: Diagnosis not present

## 2021-04-06 DIAGNOSIS — N390 Urinary tract infection, site not specified: Secondary | ICD-10-CM | POA: Diagnosis not present

## 2021-04-06 DIAGNOSIS — N8111 Cystocele, midline: Secondary | ICD-10-CM | POA: Diagnosis not present

## 2021-04-06 DIAGNOSIS — N209 Urinary calculus, unspecified: Secondary | ICD-10-CM | POA: Diagnosis not present

## 2021-04-09 ENCOUNTER — Ambulatory Visit: Payer: Medicaid Other | Admitting: Urology

## 2021-04-09 NOTE — Progress Notes (Deleted)
Assessment: No diagnosis found.   Plan: ***  Chief Complaint: No chief complaint on file.   History of Present Illness:  Alison Wilkinson is a 51 y.o. year old female who is seen in consultation from Doyle Askew, [place]  for evaluation of ***.   Past Medical History:  Past Medical History:  Diagnosis Date   A-fib ([place])    Anxiety    Atrial tachycardia (HCC)    Cancer (HCC)    thyroid   Cardiomyopathy ([place])    COPD (chronic obstructive pulmonary disease) (HCC)    Dyspnea    Dysrhythmia    GERD (gastroesophageal reflux disease)    Headache    High cholesterol    History of kidney stones    Hypertension    Hyperthyroidism    Hypothyroid    Mixed hyperlipidemia    Pacemaker    biotronic  PPM   DR. Wendell    Pacemaker    Pneumonia    hx   PONV (postoperative nausea and vomiting)    Shortness of breath dyspnea    WITH EXERTION    Sleep apnea    mild case no cpap reccommended   SSS (sick sinus syndrome) ([place])    Stroke ([place])    2009   afib  (none in years)   Syncope     Past Surgical History:  Past Surgical History:  Procedure Laterality Date   CARDIAC CATHETERIZATION     8/16   HYSTEROSCOPY  2015   MULTIPLE EXTRACTIONS WITH ALVEOLOPLASTY Bilateral 08/13/2016   Procedure: MULTIPLE EXTRACTIONS;  Surgeon: Diona Browner, DDS;  Location: [facility];  Service: Oral Surgery;  Laterality: Bilateral;   PACEMAKER INSERTION  2008   THYROIDECTOMY Bilateral 04/25/2015   Procedure: THYROIDECTOMY;  Surgeon: Melida Quitter, MD;  Location: [facility];  Service: ENT;  Laterality: Bilateral;  TOTAL THYROIDECTOMY   TUMOR REMOVAL  2013   UTERUS    Allergies:  Allergies  Allergen Reactions   Carvedilol Palpitations and Shortness Of Breath    Per pt report Per pt report Per pt report Per pt report Per pt report   Lisinopril Shortness Of Breath   Sulfa Antibiotics Nausea Only, Other (See Comments) and Nausea And Vomiting    Headache  Headache  Headache   Headache  Headache      Family History:  Family History  Problem Relation Age of Onset   Diabetes Mother    Hyperlipidemia Mother    Hypertension Mother    Depression Mother    Heart disease Mother    Bipolar disorder Mother    Liver disease Mother    Sleep apnea Mother    Obesity Mother    Hypertension Father    Stroke Father    Heart disease Father    Thyroid disease Father    Alcoholism Father    Obesity Father    Hyperlipidemia Sister    Hypertension Sister    Thyroid disease Sister    Hypertension Daughter    Mental illness Daughter     Social History:  Social History   Tobacco Use   Smoking status: Former    Packs/day: 0.50    Years: 35.00    Pack years: 17.50    Types: Cigarettes    Quit date: 12/20/2016    Years since quitting: 4.3   Smokeless tobacco: Former  Scientific laboratory technician Use: Never used  Substance Use Topics   Alcohol use: No  Comment: NONE IN 6 YEARS   Drug use: No    Types: Marijuana    Comment: OVER 1 YEAR    Review of symptoms:  Constitutional:  Negative for unexplained weight loss, night sweats, fever, chills ENT:  Negative for nose bleeds, sinus pain, painful swallowing CV:  Negative for chest pain, shortness of breath, exercise intolerance, palpitations, loss of consciousness Resp:  Negative for cough, wheezing, shortness of breath GI:  Negative for nausea, vomiting, diarrhea, bloody stools GU:  Positives noted in HPI; otherwise negative for gross hematuria, dysuria, urinary incontinence Neuro:  Negative for seizures, poor balance, limb weakness, slurred speech Psych:  Negative for lack of energy, depression, anxiety Endocrine:  Negative for polydipsia, polyuria, symptoms of hypoglycemia (dizziness, hunger, sweating) Hematologic:  Negative for anemia, purpura, petechia, prolonged or excessive bleeding, use of anticoagulants  Allergic:  Negative for difficulty breathing or choking as a result of exposure to anything; no  shellfish allergy; no allergic response (rash/itch) to materials, foods  Physical exam: There were no vitals taken for this visit. GENERAL APPEARANCE:  Well appearing, well developed, well nourished, NAD HEENT: Atraumatic, Normocephalic, oropharynx clear. NECK: Supple without lymphadenopathy or thyromegaly. LUNGS: Clear to auscultation bilaterally. HEART: Regular Rate and Rhythm without murmurs, gallops, or rubs. ABDOMEN: Soft, non-tender, No Masses. EXTREMITIES: Moves all extremities well.  Without clubbing, cyanosis, or edema. NEUROLOGIC:  Alert and oriented x 3, normal gait, CN II-XII grossly intact.  MENTAL STATUS:  Appropriate. BACK:  Non-tender to palpation.  No CVAT SKIN:  Warm, dry and intact.    Results: No results found for this or any previous visit (from the past 24 hour(s)).

## 2021-04-10 DIAGNOSIS — N812 Incomplete uterovaginal prolapse: Secondary | ICD-10-CM | POA: Diagnosis not present

## 2021-04-10 DIAGNOSIS — Z87440 Personal history of urinary (tract) infections: Secondary | ICD-10-CM | POA: Diagnosis not present

## 2021-04-10 DIAGNOSIS — R338 Other retention of urine: Secondary | ICD-10-CM | POA: Diagnosis not present

## 2021-04-10 DIAGNOSIS — N952 Postmenopausal atrophic vaginitis: Secondary | ICD-10-CM | POA: Diagnosis not present

## 2021-04-10 DIAGNOSIS — N3281 Overactive bladder: Secondary | ICD-10-CM | POA: Diagnosis not present

## 2021-04-16 DIAGNOSIS — E039 Hypothyroidism, unspecified: Secondary | ICD-10-CM | POA: Diagnosis not present

## 2021-04-16 DIAGNOSIS — Z79899 Other long term (current) drug therapy: Secondary | ICD-10-CM | POA: Diagnosis not present

## 2021-04-16 DIAGNOSIS — E038 Other specified hypothyroidism: Secondary | ICD-10-CM | POA: Diagnosis not present

## 2021-04-16 DIAGNOSIS — E782 Mixed hyperlipidemia: Secondary | ICD-10-CM | POA: Diagnosis not present

## 2021-04-27 DIAGNOSIS — I495 Sick sinus syndrome: Secondary | ICD-10-CM | POA: Diagnosis not present

## 2021-04-28 DIAGNOSIS — H43821 Vitreomacular adhesion, right eye: Secondary | ICD-10-CM | POA: Diagnosis not present

## 2021-04-28 DIAGNOSIS — H5213 Myopia, bilateral: Secondary | ICD-10-CM | POA: Diagnosis not present

## 2021-04-30 DIAGNOSIS — L0291 Cutaneous abscess, unspecified: Secondary | ICD-10-CM | POA: Diagnosis not present

## 2021-05-05 DIAGNOSIS — L0291 Cutaneous abscess, unspecified: Secondary | ICD-10-CM | POA: Diagnosis not present

## 2021-05-07 DIAGNOSIS — E890 Postprocedural hypothyroidism: Secondary | ICD-10-CM | POA: Diagnosis not present

## 2021-05-07 DIAGNOSIS — E785 Hyperlipidemia, unspecified: Secondary | ICD-10-CM | POA: Diagnosis not present

## 2021-05-07 DIAGNOSIS — R7303 Prediabetes: Secondary | ICD-10-CM | POA: Diagnosis not present

## 2021-05-07 DIAGNOSIS — C73 Malignant neoplasm of thyroid gland: Secondary | ICD-10-CM | POA: Diagnosis not present

## 2021-05-11 DIAGNOSIS — Z87440 Personal history of urinary (tract) infections: Secondary | ICD-10-CM | POA: Diagnosis not present

## 2021-05-11 DIAGNOSIS — K5904 Chronic idiopathic constipation: Secondary | ICD-10-CM | POA: Diagnosis not present

## 2021-05-11 DIAGNOSIS — N812 Incomplete uterovaginal prolapse: Secondary | ICD-10-CM | POA: Diagnosis not present

## 2021-05-11 DIAGNOSIS — N952 Postmenopausal atrophic vaginitis: Secondary | ICD-10-CM | POA: Diagnosis not present

## 2021-05-11 DIAGNOSIS — N3281 Overactive bladder: Secondary | ICD-10-CM | POA: Diagnosis not present

## 2021-05-11 DIAGNOSIS — R338 Other retention of urine: Secondary | ICD-10-CM | POA: Diagnosis not present

## 2021-05-11 DIAGNOSIS — N816 Rectocele: Secondary | ICD-10-CM | POA: Diagnosis not present

## 2021-05-14 DIAGNOSIS — C73 Malignant neoplasm of thyroid gland: Secondary | ICD-10-CM | POA: Diagnosis not present

## 2021-05-15 DIAGNOSIS — R110 Nausea: Secondary | ICD-10-CM | POA: Diagnosis not present

## 2021-05-15 DIAGNOSIS — B349 Viral infection, unspecified: Secondary | ICD-10-CM | POA: Diagnosis not present

## 2021-05-15 DIAGNOSIS — R070 Pain in throat: Secondary | ICD-10-CM | POA: Diagnosis not present

## 2021-05-15 DIAGNOSIS — R062 Wheezing: Secondary | ICD-10-CM | POA: Diagnosis not present

## 2021-05-18 DIAGNOSIS — R899 Unspecified abnormal finding in specimens from other organs, systems and tissues: Secondary | ICD-10-CM | POA: Diagnosis not present

## 2021-05-18 DIAGNOSIS — C73 Malignant neoplasm of thyroid gland: Secondary | ICD-10-CM | POA: Diagnosis not present

## 2021-05-25 DIAGNOSIS — E782 Mixed hyperlipidemia: Secondary | ICD-10-CM | POA: Diagnosis not present

## 2021-05-25 DIAGNOSIS — I10 Essential (primary) hypertension: Secondary | ICD-10-CM | POA: Diagnosis not present

## 2021-05-25 DIAGNOSIS — I495 Sick sinus syndrome: Secondary | ICD-10-CM | POA: Diagnosis not present

## 2021-05-25 DIAGNOSIS — I480 Paroxysmal atrial fibrillation: Secondary | ICD-10-CM | POA: Diagnosis not present

## 2021-05-25 DIAGNOSIS — I428 Other cardiomyopathies: Secondary | ICD-10-CM | POA: Diagnosis not present

## 2021-05-25 DIAGNOSIS — I471 Supraventricular tachycardia: Secondary | ICD-10-CM | POA: Diagnosis not present

## 2021-05-25 DIAGNOSIS — Z950 Presence of cardiac pacemaker: Secondary | ICD-10-CM | POA: Diagnosis not present

## 2021-07-29 DIAGNOSIS — E785 Hyperlipidemia, unspecified: Secondary | ICD-10-CM | POA: Diagnosis not present

## 2021-07-29 DIAGNOSIS — C73 Malignant neoplasm of thyroid gland: Secondary | ICD-10-CM | POA: Diagnosis not present

## 2021-07-29 DIAGNOSIS — E890 Postprocedural hypothyroidism: Secondary | ICD-10-CM | POA: Diagnosis not present

## 2021-08-03 DIAGNOSIS — I495 Sick sinus syndrome: Secondary | ICD-10-CM | POA: Diagnosis not present

## 2021-08-06 DIAGNOSIS — N812 Incomplete uterovaginal prolapse: Secondary | ICD-10-CM | POA: Diagnosis not present

## 2021-08-06 DIAGNOSIS — N393 Stress incontinence (female) (male): Secondary | ICD-10-CM | POA: Diagnosis not present

## 2021-08-06 DIAGNOSIS — N3281 Overactive bladder: Secondary | ICD-10-CM | POA: Diagnosis not present

## 2021-08-06 DIAGNOSIS — R339 Retention of urine, unspecified: Secondary | ICD-10-CM | POA: Diagnosis not present

## 2021-08-07 DIAGNOSIS — Z1231 Encounter for screening mammogram for malignant neoplasm of breast: Secondary | ICD-10-CM | POA: Diagnosis not present

## 2021-08-17 DIAGNOSIS — R300 Dysuria: Secondary | ICD-10-CM | POA: Diagnosis not present

## 2021-08-17 DIAGNOSIS — Z0000 Encounter for general adult medical examination without abnormal findings: Secondary | ICD-10-CM | POA: Diagnosis not present

## 2021-08-31 DIAGNOSIS — I495 Sick sinus syndrome: Secondary | ICD-10-CM | POA: Diagnosis not present

## 2021-09-06 DIAGNOSIS — R002 Palpitations: Secondary | ICD-10-CM | POA: Diagnosis not present

## 2021-09-06 DIAGNOSIS — R531 Weakness: Secondary | ICD-10-CM | POA: Diagnosis not present

## 2021-09-06 DIAGNOSIS — R42 Dizziness and giddiness: Secondary | ICD-10-CM | POA: Diagnosis not present

## 2021-09-09 DIAGNOSIS — I10 Essential (primary) hypertension: Secondary | ICD-10-CM | POA: Diagnosis not present

## 2021-09-09 DIAGNOSIS — G43811 Other migraine, intractable, with status migrainosus: Secondary | ICD-10-CM | POA: Diagnosis not present

## 2021-09-09 DIAGNOSIS — M50323 Other cervical disc degeneration at C6-C7 level: Secondary | ICD-10-CM | POA: Diagnosis not present

## 2021-09-09 DIAGNOSIS — M542 Cervicalgia: Secondary | ICD-10-CM | POA: Diagnosis not present

## 2021-09-16 DIAGNOSIS — M5386 Other specified dorsopathies, lumbar region: Secondary | ICD-10-CM | POA: Diagnosis not present

## 2021-09-16 DIAGNOSIS — M9905 Segmental and somatic dysfunction of pelvic region: Secondary | ICD-10-CM | POA: Diagnosis not present

## 2021-09-16 DIAGNOSIS — M9904 Segmental and somatic dysfunction of sacral region: Secondary | ICD-10-CM | POA: Diagnosis not present

## 2021-09-16 DIAGNOSIS — M9903 Segmental and somatic dysfunction of lumbar region: Secondary | ICD-10-CM | POA: Diagnosis not present

## 2021-09-22 DIAGNOSIS — M9904 Segmental and somatic dysfunction of sacral region: Secondary | ICD-10-CM | POA: Diagnosis not present

## 2021-09-22 DIAGNOSIS — M9903 Segmental and somatic dysfunction of lumbar region: Secondary | ICD-10-CM | POA: Diagnosis not present

## 2021-09-22 DIAGNOSIS — M5386 Other specified dorsopathies, lumbar region: Secondary | ICD-10-CM | POA: Diagnosis not present

## 2021-09-22 DIAGNOSIS — M9905 Segmental and somatic dysfunction of pelvic region: Secondary | ICD-10-CM | POA: Diagnosis not present

## 2021-09-24 DIAGNOSIS — M9905 Segmental and somatic dysfunction of pelvic region: Secondary | ICD-10-CM | POA: Diagnosis not present

## 2021-09-24 DIAGNOSIS — M9904 Segmental and somatic dysfunction of sacral region: Secondary | ICD-10-CM | POA: Diagnosis not present

## 2021-09-24 DIAGNOSIS — M9903 Segmental and somatic dysfunction of lumbar region: Secondary | ICD-10-CM | POA: Diagnosis not present

## 2021-09-24 DIAGNOSIS — M5386 Other specified dorsopathies, lumbar region: Secondary | ICD-10-CM | POA: Diagnosis not present

## 2021-10-14 DIAGNOSIS — G43911 Migraine, unspecified, intractable, with status migrainosus: Secondary | ICD-10-CM | POA: Diagnosis not present

## 2021-10-27 DIAGNOSIS — I495 Sick sinus syndrome: Secondary | ICD-10-CM | POA: Diagnosis not present

## 2021-11-02 ENCOUNTER — Telehealth: Payer: Self-pay | Admitting: Licensed Clinical Social Worker

## 2021-11-02 NOTE — Patient Outreach (Signed)
  Medicaid Managed Care   Unsuccessful Attempt Note   11/02/2021 Name: Alison Wilkinson MRN: [id number] DOB: 04-01-1970  Referred by: Doyle Askew, PA-C Reason for referral : Transitions Of Care   An unsuccessful telephone outreach was attempted today. The patient was referred to the case management team for assistance with care management and care coordination.    Follow Up Plan: A HIPAA compliant phone message was left for the patient providing contact information and requesting a return call.   Eula Fried, BSW, MSW, [employer] Managed Medicaid [email or web address] Phone: [phone]

## 2021-11-10 DIAGNOSIS — M9904 Segmental and somatic dysfunction of sacral region: Secondary | ICD-10-CM | POA: Diagnosis not present

## 2021-11-10 DIAGNOSIS — M9903 Segmental and somatic dysfunction of lumbar region: Secondary | ICD-10-CM | POA: Diagnosis not present

## 2021-11-10 DIAGNOSIS — M9905 Segmental and somatic dysfunction of pelvic region: Secondary | ICD-10-CM | POA: Diagnosis not present

## 2021-11-10 DIAGNOSIS — M5386 Other specified dorsopathies, lumbar region: Secondary | ICD-10-CM | POA: Diagnosis not present

## 2021-11-11 DIAGNOSIS — M9904 Segmental and somatic dysfunction of sacral region: Secondary | ICD-10-CM | POA: Diagnosis not present

## 2021-11-11 DIAGNOSIS — M9905 Segmental and somatic dysfunction of pelvic region: Secondary | ICD-10-CM | POA: Diagnosis not present

## 2021-11-11 DIAGNOSIS — M9903 Segmental and somatic dysfunction of lumbar region: Secondary | ICD-10-CM | POA: Diagnosis not present

## 2021-11-11 DIAGNOSIS — M5386 Other specified dorsopathies, lumbar region: Secondary | ICD-10-CM | POA: Diagnosis not present

## 2021-11-13 DIAGNOSIS — N816 Rectocele: Secondary | ICD-10-CM | POA: Diagnosis not present

## 2021-11-13 DIAGNOSIS — Z01419 Encounter for gynecological examination (general) (routine) without abnormal findings: Secondary | ICD-10-CM | POA: Diagnosis not present

## 2021-11-13 DIAGNOSIS — N812 Incomplete uterovaginal prolapse: Secondary | ICD-10-CM | POA: Diagnosis not present

## 2021-11-13 DIAGNOSIS — N393 Stress incontinence (female) (male): Secondary | ICD-10-CM | POA: Diagnosis not present

## 2021-11-13 DIAGNOSIS — R339 Retention of urine, unspecified: Secondary | ICD-10-CM | POA: Diagnosis not present

## 2021-12-24 DIAGNOSIS — N390 Urinary tract infection, site not specified: Secondary | ICD-10-CM | POA: Diagnosis not present

## 2022-01-01 DIAGNOSIS — J029 Acute pharyngitis, unspecified: Secondary | ICD-10-CM | POA: Diagnosis not present

## 2022-01-01 DIAGNOSIS — R059 Cough, unspecified: Secondary | ICD-10-CM | POA: Diagnosis not present

## 2022-01-06 DIAGNOSIS — E890 Postprocedural hypothyroidism: Secondary | ICD-10-CM | POA: Diagnosis not present

## 2022-01-06 DIAGNOSIS — R7303 Prediabetes: Secondary | ICD-10-CM | POA: Diagnosis not present

## 2022-01-06 DIAGNOSIS — E785 Hyperlipidemia, unspecified: Secondary | ICD-10-CM | POA: Diagnosis not present

## 2022-01-06 DIAGNOSIS — C73 Malignant neoplasm of thyroid gland: Secondary | ICD-10-CM | POA: Diagnosis not present

## 2022-01-09 DIAGNOSIS — R1031 Right lower quadrant pain: Secondary | ICD-10-CM | POA: Diagnosis not present

## 2022-01-09 DIAGNOSIS — N3000 Acute cystitis without hematuria: Secondary | ICD-10-CM | POA: Diagnosis not present

## 2022-01-09 DIAGNOSIS — N200 Calculus of kidney: Secondary | ICD-10-CM | POA: Diagnosis not present

## 2022-01-29 DIAGNOSIS — E079 Disorder of thyroid, unspecified: Secondary | ICD-10-CM | POA: Diagnosis not present

## 2022-01-29 DIAGNOSIS — Z87891 Personal history of nicotine dependence: Secondary | ICD-10-CM | POA: Diagnosis not present

## 2022-01-29 DIAGNOSIS — E785 Hyperlipidemia, unspecified: Secondary | ICD-10-CM | POA: Diagnosis not present

## 2022-01-29 DIAGNOSIS — Z79899 Other long term (current) drug therapy: Secondary | ICD-10-CM | POA: Diagnosis not present

## 2022-01-29 DIAGNOSIS — R1030 Lower abdominal pain, unspecified: Secondary | ICD-10-CM | POA: Diagnosis not present

## 2022-01-29 DIAGNOSIS — E119 Type 2 diabetes mellitus without complications: Secondary | ICD-10-CM | POA: Diagnosis not present

## 2022-01-29 DIAGNOSIS — N3289 Other specified disorders of bladder: Secondary | ICD-10-CM | POA: Diagnosis not present

## 2022-01-29 DIAGNOSIS — I10 Essential (primary) hypertension: Secondary | ICD-10-CM | POA: Diagnosis not present

## 2022-01-29 DIAGNOSIS — I429 Cardiomyopathy, unspecified: Secondary | ICD-10-CM | POA: Diagnosis not present

## 2022-01-29 DIAGNOSIS — Z8673 Personal history of transient ischemic attack (TIA), and cerebral infarction without residual deficits: Secondary | ICD-10-CM | POA: Diagnosis not present

## 2022-01-29 DIAGNOSIS — K219 Gastro-esophageal reflux disease without esophagitis: Secondary | ICD-10-CM | POA: Diagnosis not present

## 2022-01-29 DIAGNOSIS — R339 Retention of urine, unspecified: Secondary | ICD-10-CM | POA: Diagnosis not present

## 2022-01-29 DIAGNOSIS — Z87440 Personal history of urinary (tract) infections: Secondary | ICD-10-CM | POA: Diagnosis not present

## 2022-01-29 DIAGNOSIS — I495 Sick sinus syndrome: Secondary | ICD-10-CM | POA: Diagnosis not present

## 2022-01-29 DIAGNOSIS — J449 Chronic obstructive pulmonary disease, unspecified: Secondary | ICD-10-CM | POA: Diagnosis not present

## 2022-01-29 DIAGNOSIS — Z882 Allergy status to sulfonamides status: Secondary | ICD-10-CM | POA: Diagnosis not present

## 2022-01-29 DIAGNOSIS — Z79890 Hormone replacement therapy (postmenopausal): Secondary | ICD-10-CM | POA: Diagnosis not present

## 2022-01-29 DIAGNOSIS — I4891 Unspecified atrial fibrillation: Secondary | ICD-10-CM | POA: Diagnosis not present

## 2022-02-03 DIAGNOSIS — E119 Type 2 diabetes mellitus without complications: Secondary | ICD-10-CM | POA: Diagnosis not present

## 2022-02-03 DIAGNOSIS — Z881 Allergy status to other antibiotic agents status: Secondary | ICD-10-CM | POA: Diagnosis not present

## 2022-02-03 DIAGNOSIS — T83091A Other mechanical complication of indwelling urethral catheter, initial encounter: Secondary | ICD-10-CM | POA: Diagnosis not present

## 2022-02-03 DIAGNOSIS — I10 Essential (primary) hypertension: Secondary | ICD-10-CM | POA: Diagnosis not present

## 2022-02-03 DIAGNOSIS — E079 Disorder of thyroid, unspecified: Secondary | ICD-10-CM | POA: Diagnosis not present

## 2022-02-03 DIAGNOSIS — E785 Hyperlipidemia, unspecified: Secondary | ICD-10-CM | POA: Diagnosis not present

## 2022-02-03 DIAGNOSIS — I429 Cardiomyopathy, unspecified: Secondary | ICD-10-CM | POA: Diagnosis not present

## 2022-02-03 DIAGNOSIS — Z7722 Contact with and (suspected) exposure to environmental tobacco smoke (acute) (chronic): Secondary | ICD-10-CM | POA: Diagnosis not present

## 2022-02-03 DIAGNOSIS — Z79890 Hormone replacement therapy (postmenopausal): Secondary | ICD-10-CM | POA: Diagnosis not present

## 2022-02-03 DIAGNOSIS — Z87891 Personal history of nicotine dependence: Secondary | ICD-10-CM | POA: Diagnosis not present

## 2022-02-03 DIAGNOSIS — Z888 Allergy status to other drugs, medicaments and biological substances status: Secondary | ICD-10-CM | POA: Diagnosis not present

## 2022-02-03 DIAGNOSIS — R300 Dysuria: Secondary | ICD-10-CM | POA: Diagnosis not present

## 2022-02-03 DIAGNOSIS — R339 Retention of urine, unspecified: Secondary | ICD-10-CM | POA: Diagnosis not present

## 2022-02-03 DIAGNOSIS — Z882 Allergy status to sulfonamides status: Secondary | ICD-10-CM | POA: Diagnosis not present

## 2022-02-04 ENCOUNTER — Other Ambulatory Visit: Payer: Self-pay

## 2022-02-04 ENCOUNTER — Encounter (HOSPITAL_COMMUNITY): Payer: Self-pay

## 2022-02-04 ENCOUNTER — Emergency Department (HOSPITAL_COMMUNITY)
Admission: EM | Admit: 2022-02-04 | Discharge: 2022-02-05 | Discharge status: Against Medical Advice | Payer: Medicaid Other | Attending: Emergency Medicine | Admitting: Emergency Medicine

## 2022-02-04 DIAGNOSIS — Z5329 Procedure and treatment not carried out because of patient's decision for other reasons: Secondary | ICD-10-CM | POA: Insufficient documentation

## 2022-02-04 DIAGNOSIS — R3129 Other microscopic hematuria: Secondary | ICD-10-CM | POA: Insufficient documentation

## 2022-02-04 DIAGNOSIS — R1031 Right lower quadrant pain: Secondary | ICD-10-CM | POA: Diagnosis present

## 2022-02-04 DIAGNOSIS — Z7901 Long term (current) use of anticoagulants: Secondary | ICD-10-CM | POA: Diagnosis not present

## 2022-02-04 LAB — CBC
HCT: 42.4 % (ref 36.0–46.0)
Hemoglobin: 13.6 g/dL (ref 12.0–15.0)
MCH: 30.6 pg (ref 26.0–34.0)
MCHC: 32.1 g/dL (ref 30.0–36.0)
MCV: 95.5 fL (ref 80.0–100.0)
Platelets: 256 10*3/uL (ref 150–400)
RBC: 4.44 MIL/uL (ref 3.87–5.11)
RDW: 14.1 % (ref 11.5–15.5)
WBC: 6.7 10*3/uL (ref 4.0–10.5)
nRBC: 0 % (ref 0.0–0.2)

## 2022-02-04 LAB — BASIC METABOLIC PANEL
Anion gap: 8 (ref 5–15)
BUN: 10 mg/dL (ref 6–20)
CO2: 26 mmol/L (ref 22–32)
Calcium: 9.6 mg/dL (ref 8.9–10.3)
Chloride: 107 mmol/L (ref 98–111)
Creatinine, Ser: 1.04 mg/dL — ABNORMAL HIGH (ref 0.44–1.00)
GFR, Estimated: >60 mL/min (ref >60)
Glucose, Bld: 125 mg/dL — ABNORMAL HIGH (ref 70–99)
Potassium: 4.9 mmol/L (ref 3.5–5.1)
Sodium: 141 mmol/L (ref 135–145)

## 2022-02-04 LAB — URINALYSIS, ROUTINE W REFLEX MICROSCOPIC
Bilirubin Urine: NEGATIVE
Glucose, UA: NEGATIVE mg/dL
Ketones, ur: NEGATIVE mg/dL
Nitrite: NEGATIVE
Protein, ur: 30 mg/dL — AB
RBC / HPF: >50 RBC/hpf — ABNORMAL HIGH (ref 0–5)
Specific Gravity, Urine: 1.014 (ref 1.005–1.030)
pH: 6 (ref 5.0–8.0)

## 2022-02-04 MED ORDER — IBUPROFEN 400 MG PO TABS
600.0000 mg | ORAL_TABLET | Freq: Once | ORAL | Status: AC
  Administered 2022-02-04: 600 mg via ORAL
  Filled 2022-02-04: qty 1

## 2022-02-04 NOTE — ED Triage Notes (Signed)
Pt has a foley catheter that was placed on 11/3 for urinary retention. Today pt has had increased lower back pain radiating into right groin. Pt states she was seen at ED previously for bladder spasms and pain has gotten worse. Pt states she also does not have output unless she sits on toilet and strains. Pt states she has blood clots in urine. Pt denies fevers. Pt states last void was right before coming in here and it was a small amount.

## 2022-02-04 NOTE — ED Provider Triage Note (Signed)
Emergency Medicine Provider Triage Evaluation Note  ANTONIETA SLAVEN , a 51 y.o. female  was evaluated in triage.  Pt complains of urinary retention and back pain. Was seen on 11/3 for urinary retention, had foley placed. Was seen several days later in ER for bladder spasms, given medication. Pain got severely worse today with decreased urine output. Feels she has to strain for any urine to flow, and when it does she is passing blood clots. Doesn't have follow up with urology until 11/14 and patient states she cannot wait that long in as much pain as she is.   Review of Systems  Positive: Urinary retention, back/abd/groin pain Negative: Fevers, chills  Physical Exam  BP (!) 147/105   Pulse (!) 102   Temp 97.7 F (36.5 C)   Resp 18   Ht '5\' 5"'$  (1.651 m)   Wt 117.5 kg   SpO2 98%   BMI 43.10 kg/m  Gen:   Awake, no distress   Resp:  Normal effort  MSK:   Moves extremities without difficulty  Other:  Pt appears very uncomfortable  Medical Decision Making  Medically screening exam initiated at 9:11 PM.  Appropriate orders placed.  TANE BIEGLER was informed that the remainder of the evaluation will be completed by another provider, this initial triage assessment does not replace that evaluation, and the importance of remaining in the ED until their evaluation is complete.  Workup initiated   Kateri Plummer, Hershal Coria 02/04/22 2114

## 2022-02-05 NOTE — ED Notes (Signed)
Bladder scan 0cc

## 2022-02-05 NOTE — ED Notes (Addendum)
RN rounding and patient not in room nor family member. EDP notified.

## 2022-02-05 NOTE — ED Provider Notes (Signed)
[facility] EMERGENCY DEPARTMENT Provider Note   CSN: [id number] Arrival date & time: 02/04/22  2022     History  Chief Complaint  Patient presents with   Back Pain   Urinary Retention    Alison Wilkinson is a 51 y.o. female.  51 y/o female presents to the ED for evaluation of lower abdominal pain. She describes pain that waxes/wanes in her lower abdomen which radiates to her R groin and R flank. Patient had foley catheter placed on 01/29/22 for presumed retention. Feels like when she sits and "strains" that more urine will drain into her foley bag. She has been passing some blood clots in her foley lately. She has been taking Oxybutynin for bladder spasms and states that this pain is different from spasms she has had in the past. Has been seen multiple times in the ED for similar complaints. Is not scheduled to see Urology until 02/09/22. Denies associated fevers, vomiting, vaginal bleeding, vaginal discharge.  The history is provided by the patient. No language interpreter was used.  Back Pain      Home Medications Prior to Admission medications   Medication Sig Start Date End Date Taking? Authorizing Provider  acetaminophen (TYLENOL) 500 MG tablet Take 1,000 mg by mouth every 6 (six) hours as needed.    [provider]  albuterol (PROVENTIL HFA;VENTOLIN HFA) 108 (90 Base) MCG/ACT inhaler Inhale 1-2 puffs into the lungs every 6 (six) hours as needed for wheezing or shortness of breath.    [provider]  albuterol (PROVENTIL) (2.5 MG/3ML) 0.083% nebulizer solution Take 3 mLs (2.5 mg total) by nebulization every 6 (six) hours as needed for wheezing or shortness of breath. 04/19/16   Eustaquio Maize, MD  apixaban (ELIQUIS) 5 MG TABS tablet Take 1 tablet (5 mg total) by mouth 2 (two) times daily. 07/02/19   Mercy Riding, MD  atorvastatin (LIPITOR) 40 MG tablet Take 1 tablet (40 mg total) by mouth daily at 6 PM. 07/02/19   Gonfa, Charlesetta Ivory, MD  clonazePAM  (KLONOPIN) 0.5 MG tablet Take 0.5 mg by mouth daily as needed for anxiety.     [provider]  gabapentin (NEURONTIN) 300 MG capsule Take 1 capsule (300 mg total) by mouth at bedtime. 07/02/19 12/29/19  Mercy Riding, MD  HYDROcodone-acetaminophen (NORCO/VICODIN) 5-325 MG tablet Take 1 tablet by mouth every 6 (six) hours as needed for moderate pain. 05/30/19   Baruch Gouty, FNP  levothyroxine (SYNTHROID) 112 MCG tablet Take 112 mcg by mouth daily before breakfast.    [provider]  metoprolol succinate (TOPROL-XL) 25 MG 24 hr tablet Take 25 mg by mouth daily.     [provider]  omeprazole (PRILOSEC) 40 MG capsule Take 40 mg by mouth 2 (two) times daily.    [provider]  vitamin B-12 1000 MCG tablet Take 1 tablet (1,000 mcg total) by mouth daily. 07/03/19   Mercy Riding, MD      Allergies    Carvedilol, Lisinopril, Sulfa antibiotics, and Azithromycin    Review of Systems   Review of Systems  Musculoskeletal:  Positive for back pain.  Ten systems reviewed and are negative for acute change, except as noted in the HPI.    Physical Exam Updated Vital Signs BP 113/77 (BP Location: Left Arm)   Pulse 79   Temp 98.2 F (36.8 C) (Oral)   Resp 18   Ht '5\' 5"'$  (1.651 m)   Wt 117.5 kg  SpO2 95%   BMI 43.10 kg/m  Physical Exam Vitals and nursing note reviewed.  Constitutional:      General: She is not in acute distress.    Appearance: She is well-developed. She is not diaphoretic.     Comments: Nontoxic appearing and in NAD  HENT:     Head: Normocephalic and atraumatic.  Eyes:     General: No scleral icterus.    Conjunctiva/sclera: Conjunctivae normal.  Pulmonary:     Effort: Pulmonary effort is normal. No respiratory distress.     Comments: Respirations even and unlabored Abdominal:     Palpations: Abdomen is soft.     Comments: Obese abdomen with mild lower abdominal TTP. No peritoneal signs, palpable masses.  Genitourinary:    Comments:  Yellow, clear urine in foley leg bag. No blood clots or gross hematuria noted. Musculoskeletal:        General: Normal range of motion.     Cervical back: Normal range of motion.  Skin:    General: Skin is warm and dry.     Coloration: Skin is not pale.     Findings: No erythema or rash.  Neurological:     Mental Status: She is alert and oriented to person, place, and time.  Psychiatric:        Behavior: Behavior normal.     ED Results / Procedures / Treatments   Labs (all labs ordered are listed, but only abnormal results are displayed) Labs Reviewed  BASIC METABOLIC PANEL - Abnormal; Notable for the following components:      Result Value   Glucose, Bld 125 (*)    Creatinine, Ser 1.04 (*)    All other components within normal limits  URINALYSIS, ROUTINE W REFLEX MICROSCOPIC - Abnormal; Notable for the following components:   Hgb urine dipstick MODERATE (*)    Protein, ur 30 (*)    Leukocytes,Ua TRACE (*)    RBC / HPF >50 (*)    Bacteria, UA RARE (*)    All other components within normal limits  CBC    EKG None  Radiology No results found.  Procedures Procedures    Medications Ordered in ED Medications  ibuprofen (ADVIL) tablet 600 mg (600 mg Oral Given 02/04/22 2115)    ED Course/ Medical Decision Making/ A&P Clinical Course as of 02/05/22 0346  Fri Feb 05, 2022  0305 Bladder scan 69m  [KH]  0310 Patient reports pessary was placed 9-10 months ago. Per chart review, patient treated for UTI x 4 beginning the end of September. She was seen on 01/09/22 at OSH with negative CT abdomen/pelvis. Presented to the ED again on 01/29/22 at which time foley was placed for presumed retention without evidence of UTI. Subsequently presented for abdominal pain complaints post-foley placement on 02/03/22. Started on oxybutynin and told to keep Urology f/u appt.   Today patient is not ill or toxic appearing. She has some vague lower abdominal TTP, but no guarding or signs of  peritoneal abdomen. Her bladder scan does not suggest ongoing retention. Creatinine preserved and does not raise concern for other obstructive uropathy. UA without evidence of UTI. Microscopic hematuria suspected to be from foley irritation. Prior abdominal CT 1 month ago negative for kidney stones.   Offered the patient pain medication in the ED for discomfort; she declined, states she doesn't tolerate pain medication well. [[XB]  [id number] to reassess patient and discuss pain control options. She was found to have eloped from the department. [KH]  Clinical Course User Index [KH] Antonietta Breach, PA-C                           Medical Decision Making  This patient presents to the ED for concern of lower abdominal pain, this involves an extensive number of treatment options, and is a complaint that carries with it a high risk of complications and morbidity.  The differential diagnosis includes UTI vs pyelonephritis vs interstitial cystitis vs ovarian cyst vs kidney stone vs appendicitis   Co morbidities that complicate the patient evaluation  Obesity    Additional history obtained:  Additional history obtained from friend at bedside External records from outside source obtained and reviewed including abdominal/pelvic CT scan from 01/09/22 which was negative for acute process   Lab Tests:  I Ordered, and personally interpreted labs.  The pertinent results include:  Hematuria on UA. Creatinine 1.04 (stable).   Medicines ordered and prescription drug management:  I ordered medication including ibuprofen for pain  Reevaluation of the patient after these medicines showed that the patient stayed the same I have reviewed the patients home medicines and have made adjustments as needed   Test Considered:  Pelvic US   Problem List / ED Course:  As above   Reevaluation:  After the interventions noted above, I reevaluated the patient and found that they have :stayed the  same   Social Determinants of Health:  Insured patient   Dispostion:  Patient eloped from the ED prior to completion of her evaluation.          Final Clinical Impression(s) / ED Diagnoses Final diagnoses:  Microscopic hematuria    Rx / DC Orders ED Discharge Orders     None         Antonietta Breach, PA-C 02/05/22 0450    Merryl Hacker, MD 02/05/22 [phone]

## 2022-02-08 ENCOUNTER — Telehealth: Payer: Self-pay | Admitting: *Deleted

## 2022-02-08 NOTE — Patient Outreach (Signed)
  Care Coordination [facility] Note Transition Care Management Unsuccessful Follow-up Telephone Call  Date of discharge and from where:  Eloped from [facility] on 02/05/22  Attempts:  1st Attempt  Reason for unsuccessful TCM follow-up call:  Left voice message   Lurena Joiner RN, BSN [place] RN Care Coordinator

## 2022-02-12 DIAGNOSIS — R3914 Feeling of incomplete bladder emptying: Secondary | ICD-10-CM | POA: Diagnosis not present

## 2022-02-12 DIAGNOSIS — N393 Stress incontinence (female) (male): Secondary | ICD-10-CM | POA: Diagnosis not present

## 2022-02-12 DIAGNOSIS — N812 Incomplete uterovaginal prolapse: Secondary | ICD-10-CM | POA: Diagnosis not present

## 2022-02-15 DIAGNOSIS — I495 Sick sinus syndrome: Secondary | ICD-10-CM | POA: Diagnosis not present

## 2022-02-25 DIAGNOSIS — R1319 Other dysphagia: Secondary | ICD-10-CM | POA: Diagnosis not present

## 2022-02-25 DIAGNOSIS — Z86010 Personal history of colonic polyps: Secondary | ICD-10-CM | POA: Diagnosis not present

## 2022-02-25 DIAGNOSIS — K5900 Constipation, unspecified: Secondary | ICD-10-CM | POA: Diagnosis not present

## 2022-02-25 DIAGNOSIS — K219 Gastro-esophageal reflux disease without esophagitis: Secondary | ICD-10-CM | POA: Diagnosis not present

## 2022-04-01 DIAGNOSIS — Z950 Presence of cardiac pacemaker: Secondary | ICD-10-CM | POA: Diagnosis not present

## 2022-04-01 DIAGNOSIS — I10 Essential (primary) hypertension: Secondary | ICD-10-CM | POA: Diagnosis not present

## 2022-04-01 DIAGNOSIS — I4719 Other supraventricular tachycardia: Secondary | ICD-10-CM | POA: Diagnosis not present

## 2022-04-01 DIAGNOSIS — I495 Sick sinus syndrome: Secondary | ICD-10-CM | POA: Diagnosis not present

## 2022-04-01 DIAGNOSIS — I480 Paroxysmal atrial fibrillation: Secondary | ICD-10-CM | POA: Diagnosis not present

## 2022-04-01 DIAGNOSIS — I428 Other cardiomyopathies: Secondary | ICD-10-CM | POA: Diagnosis not present

## 2022-04-01 DIAGNOSIS — E782 Mixed hyperlipidemia: Secondary | ICD-10-CM | POA: Diagnosis not present

## 2022-04-13 DIAGNOSIS — C73 Malignant neoplasm of thyroid gland: Secondary | ICD-10-CM | POA: Diagnosis not present

## 2022-05-03 DIAGNOSIS — N952 Postmenopausal atrophic vaginitis: Secondary | ICD-10-CM | POA: Diagnosis not present

## 2022-05-03 DIAGNOSIS — N812 Incomplete uterovaginal prolapse: Secondary | ICD-10-CM | POA: Diagnosis not present

## 2022-05-03 DIAGNOSIS — N393 Stress incontinence (female) (male): Secondary | ICD-10-CM | POA: Diagnosis not present

## 2022-05-11 DIAGNOSIS — D251 Intramural leiomyoma of uterus: Secondary | ICD-10-CM | POA: Diagnosis not present

## 2022-05-17 DIAGNOSIS — N3289 Other specified disorders of bladder: Secondary | ICD-10-CM | POA: Diagnosis not present

## 2022-05-17 DIAGNOSIS — T83098A Other mechanical complication of other indwelling urethral catheter, initial encounter: Secondary | ICD-10-CM | POA: Diagnosis not present

## 2022-05-17 DIAGNOSIS — R339 Retention of urine, unspecified: Secondary | ICD-10-CM | POA: Diagnosis not present

## 2022-05-17 DIAGNOSIS — T83091A Other mechanical complication of indwelling urethral catheter, initial encounter: Secondary | ICD-10-CM | POA: Diagnosis not present

## 2022-05-17 DIAGNOSIS — R82998 Other abnormal findings in urine: Secondary | ICD-10-CM | POA: Diagnosis not present

## 2022-05-20 DIAGNOSIS — R339 Retention of urine, unspecified: Secondary | ICD-10-CM | POA: Diagnosis not present

## 2022-05-24 DIAGNOSIS — I495 Sick sinus syndrome: Secondary | ICD-10-CM | POA: Diagnosis not present

## 2022-05-24 DIAGNOSIS — E785 Hyperlipidemia, unspecified: Secondary | ICD-10-CM | POA: Diagnosis not present

## 2022-05-24 DIAGNOSIS — J449 Chronic obstructive pulmonary disease, unspecified: Secondary | ICD-10-CM | POA: Diagnosis not present

## 2022-05-24 DIAGNOSIS — Z950 Presence of cardiac pacemaker: Secondary | ICD-10-CM | POA: Diagnosis not present

## 2022-05-24 DIAGNOSIS — Z888 Allergy status to other drugs, medicaments and biological substances status: Secondary | ICD-10-CM | POA: Diagnosis not present

## 2022-05-24 DIAGNOSIS — E079 Disorder of thyroid, unspecified: Secondary | ICD-10-CM | POA: Diagnosis not present

## 2022-05-24 DIAGNOSIS — Z90710 Acquired absence of both cervix and uterus: Secondary | ICD-10-CM | POA: Diagnosis not present

## 2022-05-24 DIAGNOSIS — N939 Abnormal uterine and vaginal bleeding, unspecified: Secondary | ICD-10-CM | POA: Diagnosis not present

## 2022-05-24 DIAGNOSIS — Z87891 Personal history of nicotine dependence: Secondary | ICD-10-CM | POA: Diagnosis not present

## 2022-05-24 DIAGNOSIS — I10 Essential (primary) hypertension: Secondary | ICD-10-CM | POA: Diagnosis not present

## 2022-05-24 DIAGNOSIS — Z8673 Personal history of transient ischemic attack (TIA), and cerebral infarction without residual deficits: Secondary | ICD-10-CM | POA: Diagnosis not present

## 2022-05-24 DIAGNOSIS — Z79899 Other long term (current) drug therapy: Secondary | ICD-10-CM | POA: Diagnosis not present

## 2022-05-24 DIAGNOSIS — E119 Type 2 diabetes mellitus without complications: Secondary | ICD-10-CM | POA: Diagnosis not present

## 2022-05-24 DIAGNOSIS — Z882 Allergy status to sulfonamides status: Secondary | ICD-10-CM | POA: Diagnosis not present

## 2022-05-24 DIAGNOSIS — R1030 Lower abdominal pain, unspecified: Secondary | ICD-10-CM | POA: Diagnosis not present

## 2022-05-24 DIAGNOSIS — Z9889 Other specified postprocedural states: Secondary | ICD-10-CM | POA: Diagnosis not present

## 2022-05-24 DIAGNOSIS — I4891 Unspecified atrial fibrillation: Secondary | ICD-10-CM | POA: Diagnosis not present

## 2022-05-24 DIAGNOSIS — I429 Cardiomyopathy, unspecified: Secondary | ICD-10-CM | POA: Diagnosis not present

## 2022-06-16 DIAGNOSIS — R350 Frequency of micturition: Secondary | ICD-10-CM | POA: Diagnosis not present

## 2022-06-16 DIAGNOSIS — R300 Dysuria: Secondary | ICD-10-CM | POA: Diagnosis not present

## 2022-06-28 DIAGNOSIS — R8290 Unspecified abnormal findings in urine: Secondary | ICD-10-CM | POA: Diagnosis not present

## 2022-06-28 DIAGNOSIS — R300 Dysuria: Secondary | ICD-10-CM | POA: Diagnosis not present

## 2022-07-08 DIAGNOSIS — E890 Postprocedural hypothyroidism: Secondary | ICD-10-CM | POA: Diagnosis not present

## 2022-07-08 DIAGNOSIS — C73 Malignant neoplasm of thyroid gland: Secondary | ICD-10-CM | POA: Diagnosis not present

## 2022-07-08 DIAGNOSIS — R7303 Prediabetes: Secondary | ICD-10-CM | POA: Diagnosis not present

## 2022-07-08 DIAGNOSIS — E785 Hyperlipidemia, unspecified: Secondary | ICD-10-CM | POA: Diagnosis not present

## 2022-07-16 DIAGNOSIS — C73 Malignant neoplasm of thyroid gland: Secondary | ICD-10-CM | POA: Diagnosis not present

## 2022-08-09 DIAGNOSIS — Z86010 Personal history of colonic polyps: Secondary | ICD-10-CM | POA: Diagnosis not present

## 2022-08-09 DIAGNOSIS — R1310 Dysphagia, unspecified: Secondary | ICD-10-CM | POA: Diagnosis not present

## 2022-08-09 DIAGNOSIS — D123 Benign neoplasm of transverse colon: Secondary | ICD-10-CM | POA: Diagnosis not present

## 2022-08-09 DIAGNOSIS — J449 Chronic obstructive pulmonary disease, unspecified: Secondary | ICD-10-CM | POA: Diagnosis not present

## 2022-08-09 DIAGNOSIS — Z09 Encounter for follow-up examination after completed treatment for conditions other than malignant neoplasm: Secondary | ICD-10-CM | POA: Diagnosis not present

## 2022-08-16 DIAGNOSIS — N398 Other specified disorders of urinary system: Secondary | ICD-10-CM | POA: Diagnosis not present

## 2022-08-30 DIAGNOSIS — I495 Sick sinus syndrome: Secondary | ICD-10-CM | POA: Diagnosis not present

## 2022-09-01 DIAGNOSIS — R7303 Prediabetes: Secondary | ICD-10-CM | POA: Diagnosis not present

## 2022-09-01 DIAGNOSIS — C73 Malignant neoplasm of thyroid gland: Secondary | ICD-10-CM | POA: Diagnosis not present

## 2022-09-01 DIAGNOSIS — E890 Postprocedural hypothyroidism: Secondary | ICD-10-CM | POA: Diagnosis not present

## 2022-09-01 DIAGNOSIS — E785 Hyperlipidemia, unspecified: Secondary | ICD-10-CM | POA: Diagnosis not present

## 2022-09-09 ENCOUNTER — Emergency Department (HOSPITAL_COMMUNITY)
Admission: EM | Admit: 2022-09-09 | Discharge: 2022-09-09 | Disposition: A | Discharge status: Home / Self Care | Payer: Medicaid Other | Attending: Emergency Medicine | Admitting: Emergency Medicine

## 2022-09-09 ENCOUNTER — Encounter (HOSPITAL_COMMUNITY): Payer: Self-pay | Admitting: Emergency Medicine

## 2022-09-09 ENCOUNTER — Other Ambulatory Visit: Payer: Self-pay

## 2022-09-09 ENCOUNTER — Emergency Department (HOSPITAL_COMMUNITY): Payer: Medicaid Other

## 2022-09-09 DIAGNOSIS — I480 Paroxysmal atrial fibrillation: Secondary | ICD-10-CM | POA: Insufficient documentation

## 2022-09-09 DIAGNOSIS — Z79899 Other long term (current) drug therapy: Secondary | ICD-10-CM | POA: Insufficient documentation

## 2022-09-09 DIAGNOSIS — R002 Palpitations: Secondary | ICD-10-CM | POA: Diagnosis not present

## 2022-09-09 DIAGNOSIS — R531 Weakness: Secondary | ICD-10-CM | POA: Insufficient documentation

## 2022-09-09 DIAGNOSIS — E039 Hypothyroidism, unspecified: Secondary | ICD-10-CM | POA: Diagnosis not present

## 2022-09-09 DIAGNOSIS — Z950 Presence of cardiac pacemaker: Secondary | ICD-10-CM | POA: Diagnosis not present

## 2022-09-09 DIAGNOSIS — Z7951 Long term (current) use of inhaled steroids: Secondary | ICD-10-CM | POA: Diagnosis not present

## 2022-09-09 DIAGNOSIS — J449 Chronic obstructive pulmonary disease, unspecified: Secondary | ICD-10-CM | POA: Diagnosis not present

## 2022-09-09 DIAGNOSIS — R0602 Shortness of breath: Secondary | ICD-10-CM | POA: Diagnosis not present

## 2022-09-09 LAB — COMPREHENSIVE METABOLIC PANEL
ALT: 19 U/L (ref 0–44)
AST: 19 U/L (ref 15–41)
Albumin: 4.1 g/dL (ref 3.5–5.0)
Alkaline Phosphatase: 58 U/L (ref 38–126)
Anion gap: 6 (ref 5–15)
BUN: 19 mg/dL (ref 6–20)
CO2: 27 mmol/L (ref 22–32)
Calcium: 9.2 mg/dL (ref 8.9–10.3)
Chloride: 104 mmol/L (ref 98–111)
Creatinine, Ser: 1.02 mg/dL — ABNORMAL HIGH (ref 0.44–1.00)
GFR, Estimated: >60 mL/min (ref >60)
Glucose, Bld: 117 mg/dL — ABNORMAL HIGH (ref 70–99)
Potassium: 4.4 mmol/L (ref 3.5–5.1)
Sodium: 137 mmol/L (ref 135–145)
Total Bilirubin: 1.1 mg/dL (ref 0.3–1.2)
Total Protein: 7.3 g/dL (ref 6.5–8.1)

## 2022-09-09 LAB — CBC
HCT: 41 % (ref 36.0–46.0)
Hemoglobin: 13.5 g/dL (ref 12.0–15.0)
MCH: 29.7 pg (ref 26.0–34.0)
MCHC: 32.9 g/dL (ref 30.0–36.0)
MCV: 90.1 fL (ref 80.0–100.0)
Platelets: 289 10*3/uL (ref 150–400)
RBC: 4.55 MIL/uL (ref 3.87–5.11)
RDW: 14.2 % (ref 11.5–15.5)
WBC: 4.2 10*3/uL (ref 4.0–10.5)
nRBC: 0 % (ref 0.0–0.2)

## 2022-09-09 LAB — D-DIMER, QUANTITATIVE: D-Dimer, Quant: 0.57 ug/mL-FEU — ABNORMAL HIGH (ref 0.00–0.50)

## 2022-09-09 LAB — TROPONIN I (HIGH SENSITIVITY): Troponin I (High Sensitivity): <2 ng/L (ref <18)

## 2022-09-09 LAB — BRAIN NATRIURETIC PEPTIDE: B Natriuretic Peptide: 13 pg/mL (ref 0.0–100.0)

## 2022-09-09 MED ORDER — ALBUTEROL SULFATE HFA 108 (90 BASE) MCG/ACT IN AERS: 2.0000 | INHALATION_SPRAY | RESPIRATORY_TRACT | Status: DC | PRN

## 2022-09-09 MED ORDER — ALBUTEROL SULFATE (2.5 MG/3ML) 0.083% IN NEBU: 2.5000 mg | INHALATION_SOLUTION | RESPIRATORY_TRACT | Status: DC | PRN

## 2022-09-09 MED ORDER — IOHEXOL 350 MG/ML SOLN
75.0000 mL | Freq: Once | INTRAVENOUS | Status: AC | PRN
  Administered 2022-09-09: 75 mL via INTRAVENOUS

## 2022-09-09 NOTE — ED Triage Notes (Addendum)
Pt c/o SOB x 1 month and palpitations since about a week ago. Hx A fib, pacer, CHF, HTN, HLD, CVA, cardiomyopathy but pt states symptoms today are much worse than what she has typically experienced. Also notes that her thyroid function is abnormal. O2 saturation 100% in triage on room air.

## 2022-09-09 NOTE — ED Notes (Signed)
[employer] will be here around 1600 for PPM interrogation. Patient updated

## 2022-09-09 NOTE — Discharge Instructions (Addendum)
Please follow-up with your primary care doctor, results were very reassuring today in the emergency department

## 2022-09-09 NOTE — ED Notes (Signed)
[employer] called at 0145  Phone number: [phone]  Device Information: Arvilla Meres [id number] ProMRI [id number]

## 2022-09-09 NOTE — ED Provider Notes (Signed)
[place] EMERGENCY DEPARTMENT AT [facility] Provider Note   CSN: [id number] Arrival date & time: 09/09/22  1159     History  Chief Complaint  Patient presents with   Shortness of Breath   Palpitations    Alison Wilkinson is a 52 y.o. female with past medical history significant for thyromegaly, sick sinus syndrome with pacemaker in place, previous CVA, nonischemic cardiomyopathy, tobacco abuse, history of COPD, paroxysmal A-fib which was thought to be secondary to her thyroid dysfunction who is not currently taking any anticoagulants who presents with concern for shortness of breath for 1 month, with worsening this morning, patient reports that she felt like she could not get any air.  She endorses palpitations for around 1 week.  Patient also reports some generalized weakness.  She was recently found to be hypothyroid with TSH of 9 2 days ago.  Her thyroid medication was adjusted just yesterday.   Shortness of Breath Palpitations Associated symptoms: shortness of breath        Home Medications Prior to Admission medications   Medication Sig Start Date End Date Taking? Authorizing Provider  acetaminophen (TYLENOL) 500 MG tablet Take 1,000 mg by mouth every 6 (six) hours as needed.   Yes [provider]  albuterol (PROVENTIL) (2.5 MG/3ML) 0.083% nebulizer solution Take 3 mLs (2.5 mg total) by nebulization every 6 (six) hours as needed for wheezing or shortness of breath. 04/19/16  Yes Johna Sheriff, MD  atorvastatin (LIPITOR) 40 MG tablet Take 1 tablet (40 mg total) by mouth daily at 6 PM. 07/02/19  Yes Gonfa, Boyce Medici, MD  furosemide (LASIX) 20 MG tablet Take 20 mg by mouth daily as needed for fluid. 11/10/21  Yes [provider]  ibuprofen (ADVIL) 800 MG tablet Take 800 mg by mouth every 6 (six) hours as needed for mild pain (arthritis). 06/22/19  Yes [provider]  levothyroxine (SYNTHROID) 112 MCG tablet Take 112 mcg by mouth daily before  breakfast.   Yes [provider]  losartan (COZAAR) 25 MG tablet Take 25 mg by mouth at bedtime. 11/20/20  Yes [provider]  metoprolol succinate (TOPROL-XL) 25 MG 24 hr tablet Take 25 mg by mouth daily.    Yes [provider]  nitrofurantoin (MACRODANTIN) 50 MG capsule Take 50 mg by mouth daily.   Yes [provider]  omeprazole (PRILOSEC) 40 MG capsule Take 40 mg by mouth daily.   Yes [provider]  polyethylene glycol powder (GLYCOLAX/MIRALAX) 17 GM/SCOOP powder Take 1 Container by mouth daily. 1 scoop per day   Yes [provider]  Vitamin D, Ergocalciferol, (DRISDOL) 1.25 MG (50000 UNIT) CAPS capsule Take 50,000 Units by mouth every 7 (seven) days. sundays 06/05/19  Yes [provider]      Allergies    Carvedilol, Lisinopril, Sulfa antibiotics, and Azithromycin    Review of Systems   Review of Systems  Respiratory:  Positive for shortness of breath.   Cardiovascular:  Positive for palpitations.  All other systems reviewed and are negative.   Physical Exam Updated Vital Signs BP 138/78   Pulse 62   Temp 98.5 F (36.9 C)   Resp 11   Ht 5\' 5"  (1.651 m)   Wt 122.5 kg   LMP 05/28/2019   SpO2 97%   BMI 44.93 kg/m  Physical Exam Vitals and nursing note reviewed.  Constitutional:      General: She is not in acute distress.    Appearance: Normal  appearance.  HENT:     Head: Normocephalic and atraumatic.  Eyes:     General:        Right eye: No discharge.        Left eye: No discharge.  Cardiovascular:     Rate and Rhythm: Normal rate and regular rhythm.     Heart sounds: No murmur heard.    No friction rub. No gallop.  Pulmonary:     Effort: Pulmonary effort is normal.     Breath sounds: Normal breath sounds.     Comments: No wheezing, rhonchi, stridor, rales.  No respiratory distress.  No tachypnea.  No increased work of breathing throughout. Abdominal:     General: Bowel sounds are normal.      Palpations: Abdomen is soft.  Skin:    General: Skin is warm and dry.     Capillary Refill: Capillary refill takes less than 2 seconds.  Neurological:     Mental Status: She is alert and oriented to person, place, and time.  Psychiatric:        Mood and Affect: Mood normal.        Behavior: Behavior normal.     ED Results / Procedures / Treatments   Labs (all labs ordered are listed, but only abnormal results are displayed) Labs Reviewed  COMPREHENSIVE METABOLIC PANEL - Abnormal; Notable for the following components:      Result Value   Glucose, Bld 117 (*)    Creatinine, Ser 1.02 (*)    All other components within normal limits  D-DIMER, QUANTITATIVE - Abnormal; Notable for the following components:   D-Dimer, Quant 0.57 (*)    All other components within normal limits  CBC  BRAIN NATRIURETIC PEPTIDE  TROPONIN I (HIGH SENSITIVITY)    EKG EKG Interpretation  Date/Time:  Thursday September 09 2022 12:31:00 EDT Ventricular Rate:  73 PR Interval:  202 QRS Duration: 104 QT Interval:  396 QTC Calculation: 437 R Axis:   -3 Text Interpretation: Sinus rhythm Borderline prolonged PR interval Borderline T abnormalities, anterior leads similar to 2021 Confirmed by Pricilla Loveless [phone]) on 09/09/2022 2:33:25 PM  Radiology CT Angio Chest PE W and/or Wo Contrast  Result Date: 09/09/2022 CLINICAL DATA:  Shortness of breath and palpitations. EXAM: CT ANGIOGRAPHY CHEST WITH CONTRAST TECHNIQUE: Multidetector CT imaging of the chest was performed using the standard protocol during bolus administration of intravenous contrast. Multiplanar CT image reconstructions and MIPs were obtained to evaluate the vascular anatomy. RADIATION DOSE REDUCTION: This exam was performed according to the departmental dose-optimization program which includes automated exposure control, adjustment of the mA and/or kV according to patient size and/or use of iterative reconstruction technique. CONTRAST:  75mL OMNIPAQUE  IOHEXOL 350 MG/ML SOLN COMPARISON:  Chest x-ray from same day. CT chest dated December 31, 2016. FINDINGS: Cardiovascular: Satisfactory opacification of the pulmonary arteries to the segmental level. No evidence of pulmonary embolism. Normal heart size. No pericardial effusion. Unchanged left chest wall pacemaker. Mediastinum/Nodes: No enlarged mediastinal, hilar, or axillary lymph nodes. Thyroid gland, trachea, and esophagus demonstrate no significant findings. Lungs/Pleura: Lungs are clear. No pleural effusion or pneumothorax. Upper Abdomen: No acute abnormality. Musculoskeletal: No chest wall abnormality. No acute or significant osseous findings. Review of the MIP images confirms the above findings. IMPRESSION: 1. No evidence of pulmonary embolism. No acute intrathoracic process. Electronically Signed   By: Obie Dredge M.D.   On: 09/09/2022 15:31   DG Chest 2 View  Result Date: 09/09/2022 CLINICAL DATA:  Shortness  of breath. EXAM: CHEST - 2 VIEW COMPARISON:  Chest radiographs 02/02/2019 and 12/09/2016 FINDINGS: Left chest wall cardiac pacer is seen with leads overlying the right atrium and right ventricle. Cardiac silhouette is at the upper limits of normal size. Mediastinal contours are within limits. The lungs are clear. No pleural effusion pneumothorax. No acute skeletal abnormality. IMPRESSION: No acute cardiopulmonary process. Electronically Signed   By: Neita Garnet M.D.   On: 09/09/2022 13:27    Procedures Procedures    Medications Ordered in ED Medications  albuterol (PROVENTIL) (2.5 MG/3ML) 0.083% nebulizer solution 2.5 mg (has no administration in time range)  iohexol (OMNIPAQUE) 350 MG/ML injection 75 mL (75 mLs Intravenous Contrast Given 09/09/22 1452)    ED Course/ Medical Decision Making/ A&P                             Medical Decision Making Amount and/or Complexity of Data Reviewed Labs: ordered. Radiology: ordered.  Risk Prescription drug management.   This patient  is a 52 y.o. female  who presents to the ED for concern of palpitations, shob for the last month.   Differential diagnoses prior to evaluation: The emergent differential diagnosis includes, but is not limited to,  asthma exacerbation, COPD exacerbation, acute upper respiratory infection, acute bronchitis, chronic bronchitis, interstitial lung disease, ARDS, PE, pneumonia, atypical ACS, carbon monoxide poisoning, spontaneous pneumothorax, new CHF vs CHF exacerbation, versus CVA, spinal cord injury, ACS, arrhythmia, syncope, orthostatic hypotension, sepsis, hypoglycemia, hypoxia, electrolyte disturbance, endocrine disorder, anemia, environmental exposure, polypharmacy . This is not an exhaustive differential.   Past Medical History / Co-morbidities / Social History: thyromegaly, sick sinus syndrome with pacemaker in place, previous CVA, nonischemic cardiomyopathy, tobacco abuse, history of COPD, paroxysmal A-fib   Additional history: Chart reviewed. Pertinent results include: reviewed labwork, imaging from ED visits,   Physical Exam: Physical exam performed. The pertinent findings include: Patient is very well-appearing in the emergency department, she has some hypertension on arrival, blood pressure 168/92, improved to 148/83 by time of discharge without intervention.  She is in normal sinus rhythm throughout her emergency department stay.  She is in no respiratory distress with no adventitious lung sounds on my exam.  Lab Tests/Imaging studies: I personally interpreted labs/imaging and the pertinent results include: CMP notable for very mildly elevated creatinine 1.02.  CBC unremarkable, troponin negative, undetectable.  BNP normal range.  Her D-dimer is very mildly elevated at 0.57.  Will obtain CT angio to rule out PE..  I independently interpreted CT angio chest for PE, as well as plain from chest x-ray which shows no evidence of acute intrathoracic abnormality, no evidence of acute pulmonary  embolus.  I agree with the radiologist interpretation.  Cardiac monitoring: EKG obtained and interpreted by my attending physician which shows: Normal sinus rhythm, borderline T wave abnormalities but overall stable compared to baseline  Disposition: After consideration of the diagnostic results and the patients response to treatment, I feel that there is no emergent cause of patient's palpitations and shortness of breath in the emergency department, encouraged PCP follow-up if she continues to have the symptoms, I would not recommend any changes to her treatment, but did report that some of her general weakness and bad feelings in context of her hypothyroid recently diagnosed her likely secondary to the thyroid condition and should improve as her thyroid levels equilibrate.[name]   emergency department workup does not suggest an emergent condition requiring admission or immediate intervention  beyond what has been performed at this time. The plan is: as above. The patient is safe for discharge and has been instructed to return immediately for worsening symptoms, change in symptoms or any other concerns.  Final Clinical Impression(s) / ED Diagnoses Final diagnoses:  Palpitations  Shortness of breath    Rx / DC Orders ED Discharge Orders     None         [place] 09/09/22 1631    Pricilla Loveless, MD 09/14/22 [phone]

## 2022-09-17 DIAGNOSIS — N398 Other specified disorders of urinary system: Secondary | ICD-10-CM | POA: Diagnosis not present

## 2022-10-03 DIAGNOSIS — Z79899 Other long term (current) drug therapy: Secondary | ICD-10-CM | POA: Diagnosis not present

## 2022-10-03 DIAGNOSIS — M791 Myalgia, unspecified site: Secondary | ICD-10-CM | POA: Diagnosis not present

## 2022-10-03 DIAGNOSIS — I10 Essential (primary) hypertension: Secondary | ICD-10-CM | POA: Diagnosis not present

## 2022-10-03 DIAGNOSIS — E079 Disorder of thyroid, unspecified: Secondary | ICD-10-CM | POA: Diagnosis not present

## 2022-10-03 DIAGNOSIS — Z882 Allergy status to sulfonamides status: Secondary | ICD-10-CM | POA: Diagnosis not present

## 2022-10-03 DIAGNOSIS — E785 Hyperlipidemia, unspecified: Secondary | ICD-10-CM | POA: Diagnosis not present

## 2022-10-03 DIAGNOSIS — Z79890 Hormone replacement therapy (postmenopausal): Secondary | ICD-10-CM | POA: Diagnosis not present

## 2022-10-03 DIAGNOSIS — I429 Cardiomyopathy, unspecified: Secondary | ICD-10-CM | POA: Diagnosis not present

## 2022-10-03 DIAGNOSIS — Z87891 Personal history of nicotine dependence: Secondary | ICD-10-CM | POA: Diagnosis not present

## 2022-10-03 DIAGNOSIS — Z888 Allergy status to other drugs, medicaments and biological substances status: Secondary | ICD-10-CM | POA: Diagnosis not present

## 2022-10-03 DIAGNOSIS — J449 Chronic obstructive pulmonary disease, unspecified: Secondary | ICD-10-CM | POA: Diagnosis not present

## 2022-10-03 DIAGNOSIS — E119 Type 2 diabetes mellitus without complications: Secondary | ICD-10-CM | POA: Diagnosis not present

## 2022-10-03 DIAGNOSIS — Z881 Allergy status to other antibiotic agents status: Secondary | ICD-10-CM | POA: Diagnosis not present

## 2022-10-04 DIAGNOSIS — R339 Retention of urine, unspecified: Secondary | ICD-10-CM | POA: Diagnosis not present

## 2022-10-04 DIAGNOSIS — N398 Other specified disorders of urinary system: Secondary | ICD-10-CM | POA: Diagnosis not present

## 2022-10-07 ENCOUNTER — Telehealth: Payer: Self-pay

## 2022-10-07 NOTE — Telephone Encounter (Signed)
..   Medicaid Managed Care   Unsuccessful Outreach Note  10/07/2022 Name: Alison Wilkinson MRN: [id number] DOB: 06-21-1970  Referred by: Pcp, No Reason for referral : Appointment   An unsuccessful telephone outreach was attempted today. The patient was referred to the case management team for assistance with care management and care coordination.   Follow Up Plan: The care management team will reach out to the patient again over the next 7-14 days.   Weston Settle Care Guide  [facility] Managed  Care Guide [facility]  [phone]

## 2022-10-12 DIAGNOSIS — Z882 Allergy status to sulfonamides status: Secondary | ICD-10-CM | POA: Diagnosis not present

## 2022-10-12 DIAGNOSIS — Z79890 Hormone replacement therapy (postmenopausal): Secondary | ICD-10-CM | POA: Diagnosis not present

## 2022-10-12 DIAGNOSIS — N399 Disorder of urinary system, unspecified: Secondary | ICD-10-CM | POA: Diagnosis not present

## 2022-10-12 DIAGNOSIS — Z888 Allergy status to other drugs, medicaments and biological substances status: Secondary | ICD-10-CM | POA: Diagnosis not present

## 2022-10-12 DIAGNOSIS — Z79899 Other long term (current) drug therapy: Secondary | ICD-10-CM | POA: Diagnosis not present

## 2022-10-12 DIAGNOSIS — N320 Bladder-neck obstruction: Secondary | ICD-10-CM | POA: Diagnosis not present

## 2022-10-12 DIAGNOSIS — N398 Other specified disorders of urinary system: Secondary | ICD-10-CM | POA: Diagnosis not present

## 2022-10-12 DIAGNOSIS — Z881 Allergy status to other antibiotic agents status: Secondary | ICD-10-CM | POA: Diagnosis not present

## 2022-10-12 DIAGNOSIS — R39198 Other difficulties with micturition: Secondary | ICD-10-CM | POA: Diagnosis not present

## 2022-10-19 ENCOUNTER — Ambulatory Visit: Payer: Medicaid Other | Admitting: Family Medicine

## 2022-10-28 DIAGNOSIS — N398 Other specified disorders of urinary system: Secondary | ICD-10-CM | POA: Diagnosis not present

## 2022-10-28 DIAGNOSIS — N393 Stress incontinence (female) (male): Secondary | ICD-10-CM | POA: Diagnosis not present

## 2022-10-28 DIAGNOSIS — N390 Urinary tract infection, site not specified: Secondary | ICD-10-CM | POA: Diagnosis not present

## 2022-11-01 DIAGNOSIS — E782 Mixed hyperlipidemia: Secondary | ICD-10-CM | POA: Diagnosis not present

## 2022-11-01 DIAGNOSIS — I480 Paroxysmal atrial fibrillation: Secondary | ICD-10-CM | POA: Diagnosis not present

## 2022-11-01 DIAGNOSIS — E669 Obesity, unspecified: Secondary | ICD-10-CM | POA: Diagnosis not present

## 2022-11-01 DIAGNOSIS — I495 Sick sinus syndrome: Secondary | ICD-10-CM | POA: Diagnosis not present

## 2022-11-01 DIAGNOSIS — I428 Other cardiomyopathies: Secondary | ICD-10-CM | POA: Diagnosis not present

## 2022-11-01 DIAGNOSIS — Z950 Presence of cardiac pacemaker: Secondary | ICD-10-CM | POA: Diagnosis not present

## 2022-11-01 DIAGNOSIS — I10 Essential (primary) hypertension: Secondary | ICD-10-CM | POA: Diagnosis not present

## 2022-11-01 DIAGNOSIS — G4733 Obstructive sleep apnea (adult) (pediatric): Secondary | ICD-10-CM | POA: Diagnosis not present

## 2022-11-09 DIAGNOSIS — E782 Mixed hyperlipidemia: Secondary | ICD-10-CM | POA: Diagnosis not present

## 2022-11-09 DIAGNOSIS — K219 Gastro-esophageal reflux disease without esophagitis: Secondary | ICD-10-CM | POA: Diagnosis not present

## 2022-11-09 DIAGNOSIS — J439 Emphysema, unspecified: Secondary | ICD-10-CM | POA: Diagnosis not present

## 2022-11-09 DIAGNOSIS — Z720 Tobacco use: Secondary | ICD-10-CM | POA: Diagnosis not present

## 2022-11-09 DIAGNOSIS — I480 Paroxysmal atrial fibrillation: Secondary | ICD-10-CM | POA: Diagnosis not present

## 2022-11-09 DIAGNOSIS — E890 Postprocedural hypothyroidism: Secondary | ICD-10-CM | POA: Diagnosis not present

## 2022-11-09 DIAGNOSIS — I4719 Other supraventricular tachycardia: Secondary | ICD-10-CM | POA: Diagnosis not present

## 2022-11-09 DIAGNOSIS — Z950 Presence of cardiac pacemaker: Secondary | ICD-10-CM | POA: Diagnosis not present

## 2022-11-09 DIAGNOSIS — I428 Other cardiomyopathies: Secondary | ICD-10-CM | POA: Diagnosis not present

## 2022-11-09 DIAGNOSIS — I10 Essential (primary) hypertension: Secondary | ICD-10-CM | POA: Diagnosis not present

## 2022-11-16 DIAGNOSIS — C73 Malignant neoplasm of thyroid gland: Secondary | ICD-10-CM | POA: Diagnosis not present

## 2022-11-25 DIAGNOSIS — J069 Acute upper respiratory infection, unspecified: Secondary | ICD-10-CM | POA: Diagnosis not present

## 2022-11-30 DIAGNOSIS — I495 Sick sinus syndrome: Secondary | ICD-10-CM | POA: Diagnosis not present

## 2022-11-30 DIAGNOSIS — R0609 Other forms of dyspnea: Secondary | ICD-10-CM | POA: Diagnosis not present

## 2022-11-30 DIAGNOSIS — I428 Other cardiomyopathies: Secondary | ICD-10-CM | POA: Diagnosis not present

## 2022-11-30 DIAGNOSIS — R609 Edema, unspecified: Secondary | ICD-10-CM | POA: Diagnosis not present

## 2022-12-20 DIAGNOSIS — I495 Sick sinus syndrome: Secondary | ICD-10-CM | POA: Diagnosis not present

## 2022-12-20 DIAGNOSIS — Z950 Presence of cardiac pacemaker: Secondary | ICD-10-CM | POA: Diagnosis not present

## 2022-12-30 DIAGNOSIS — Z122 Encounter for screening for malignant neoplasm of respiratory organs: Secondary | ICD-10-CM | POA: Diagnosis not present

## 2022-12-30 DIAGNOSIS — G4730 Sleep apnea, unspecified: Secondary | ICD-10-CM | POA: Diagnosis not present

## 2022-12-30 DIAGNOSIS — F17211 Nicotine dependence, cigarettes, in remission: Secondary | ICD-10-CM | POA: Diagnosis not present

## 2022-12-30 DIAGNOSIS — J439 Emphysema, unspecified: Secondary | ICD-10-CM | POA: Diagnosis not present

## 2023-01-07 DIAGNOSIS — E890 Postprocedural hypothyroidism: Secondary | ICD-10-CM | POA: Diagnosis not present

## 2023-01-07 DIAGNOSIS — R7303 Prediabetes: Secondary | ICD-10-CM | POA: Diagnosis not present

## 2023-01-07 DIAGNOSIS — C73 Malignant neoplasm of thyroid gland: Secondary | ICD-10-CM | POA: Diagnosis not present

## 2023-01-07 DIAGNOSIS — E785 Hyperlipidemia, unspecified: Secondary | ICD-10-CM | POA: Diagnosis not present

## 2023-01-18 DIAGNOSIS — F17211 Nicotine dependence, cigarettes, in remission: Secondary | ICD-10-CM | POA: Diagnosis not present

## 2023-01-19 ENCOUNTER — Ambulatory Visit: Payer: Medicaid Other | Admitting: Family Medicine

## 2023-01-21 DIAGNOSIS — K219 Gastro-esophageal reflux disease without esophagitis: Secondary | ICD-10-CM | POA: Diagnosis not present

## 2023-01-21 DIAGNOSIS — I10 Essential (primary) hypertension: Secondary | ICD-10-CM | POA: Diagnosis not present

## 2023-01-21 DIAGNOSIS — E782 Mixed hyperlipidemia: Secondary | ICD-10-CM | POA: Diagnosis not present

## 2023-01-21 DIAGNOSIS — E890 Postprocedural hypothyroidism: Secondary | ICD-10-CM | POA: Diagnosis not present

## 2023-02-02 DIAGNOSIS — G4733 Obstructive sleep apnea (adult) (pediatric): Secondary | ICD-10-CM | POA: Diagnosis not present

## 2023-02-21 DIAGNOSIS — I4719 Other supraventricular tachycardia: Secondary | ICD-10-CM | POA: Diagnosis not present

## 2023-02-21 DIAGNOSIS — I480 Paroxysmal atrial fibrillation: Secondary | ICD-10-CM | POA: Diagnosis not present

## 2023-02-21 DIAGNOSIS — I495 Sick sinus syndrome: Secondary | ICD-10-CM | POA: Diagnosis not present

## 2023-02-21 DIAGNOSIS — I428 Other cardiomyopathies: Secondary | ICD-10-CM | POA: Diagnosis not present

## 2023-02-21 DIAGNOSIS — I10 Essential (primary) hypertension: Secondary | ICD-10-CM | POA: Diagnosis not present

## 2023-03-16 DIAGNOSIS — G4733 Obstructive sleep apnea (adult) (pediatric): Secondary | ICD-10-CM | POA: Diagnosis not present

## 2023-04-01 DIAGNOSIS — I10 Essential (primary) hypertension: Secondary | ICD-10-CM | POA: Diagnosis not present

## 2023-04-01 DIAGNOSIS — G4733 Obstructive sleep apnea (adult) (pediatric): Secondary | ICD-10-CM | POA: Diagnosis not present

## 2023-04-14 DIAGNOSIS — C73 Malignant neoplasm of thyroid gland: Secondary | ICD-10-CM | POA: Diagnosis not present

## 2023-04-29 DIAGNOSIS — I10 Essential (primary) hypertension: Secondary | ICD-10-CM | POA: Diagnosis not present

## 2023-04-29 DIAGNOSIS — G4733 Obstructive sleep apnea (adult) (pediatric): Secondary | ICD-10-CM | POA: Diagnosis not present

## 2023-05-02 DIAGNOSIS — I10 Essential (primary) hypertension: Secondary | ICD-10-CM | POA: Diagnosis not present

## 2023-05-02 DIAGNOSIS — G4733 Obstructive sleep apnea (adult) (pediatric): Secondary | ICD-10-CM | POA: Diagnosis not present

## 2023-05-23 DIAGNOSIS — Z9989 Dependence on other enabling machines and devices: Secondary | ICD-10-CM | POA: Diagnosis not present

## 2023-05-23 DIAGNOSIS — G4733 Obstructive sleep apnea (adult) (pediatric): Secondary | ICD-10-CM | POA: Diagnosis not present

## 2023-05-25 DIAGNOSIS — R051 Acute cough: Secondary | ICD-10-CM | POA: Diagnosis not present

## 2023-05-25 DIAGNOSIS — J40 Bronchitis, not specified as acute or chronic: Secondary | ICD-10-CM | POA: Diagnosis not present

## 2023-05-25 DIAGNOSIS — R0602 Shortness of breath: Secondary | ICD-10-CM | POA: Diagnosis not present

## 2023-05-30 DIAGNOSIS — G4733 Obstructive sleep apnea (adult) (pediatric): Secondary | ICD-10-CM | POA: Diagnosis not present

## 2023-05-30 DIAGNOSIS — I10 Essential (primary) hypertension: Secondary | ICD-10-CM | POA: Diagnosis not present

## 2023-06-02 DIAGNOSIS — M5383 Other specified dorsopathies, cervicothoracic region: Secondary | ICD-10-CM | POA: Diagnosis not present

## 2023-06-02 DIAGNOSIS — M9901 Segmental and somatic dysfunction of cervical region: Secondary | ICD-10-CM | POA: Diagnosis not present

## 2023-06-02 DIAGNOSIS — M9902 Segmental and somatic dysfunction of thoracic region: Secondary | ICD-10-CM | POA: Diagnosis not present

## 2023-06-06 DIAGNOSIS — M9902 Segmental and somatic dysfunction of thoracic region: Secondary | ICD-10-CM | POA: Diagnosis not present

## 2023-06-06 DIAGNOSIS — M5383 Other specified dorsopathies, cervicothoracic region: Secondary | ICD-10-CM | POA: Diagnosis not present

## 2023-06-06 DIAGNOSIS — M9901 Segmental and somatic dysfunction of cervical region: Secondary | ICD-10-CM | POA: Diagnosis not present

## 2023-06-07 DIAGNOSIS — M5383 Other specified dorsopathies, cervicothoracic region: Secondary | ICD-10-CM | POA: Diagnosis not present

## 2023-06-07 DIAGNOSIS — M9902 Segmental and somatic dysfunction of thoracic region: Secondary | ICD-10-CM | POA: Diagnosis not present

## 2023-06-07 DIAGNOSIS — M9901 Segmental and somatic dysfunction of cervical region: Secondary | ICD-10-CM | POA: Diagnosis not present

## 2023-06-09 DIAGNOSIS — M9902 Segmental and somatic dysfunction of thoracic region: Secondary | ICD-10-CM | POA: Diagnosis not present

## 2023-06-09 DIAGNOSIS — I10 Essential (primary) hypertension: Secondary | ICD-10-CM | POA: Diagnosis not present

## 2023-06-09 DIAGNOSIS — M9901 Segmental and somatic dysfunction of cervical region: Secondary | ICD-10-CM | POA: Diagnosis not present

## 2023-06-09 DIAGNOSIS — M5383 Other specified dorsopathies, cervicothoracic region: Secondary | ICD-10-CM | POA: Diagnosis not present

## 2023-06-09 DIAGNOSIS — G4733 Obstructive sleep apnea (adult) (pediatric): Secondary | ICD-10-CM | POA: Diagnosis not present

## 2023-06-10 DIAGNOSIS — M51362 Other intervertebral disc degeneration, lumbar region with discogenic back pain and lower extremity pain: Secondary | ICD-10-CM | POA: Diagnosis not present

## 2023-06-10 DIAGNOSIS — Z0000 Encounter for general adult medical examination without abnormal findings: Secondary | ICD-10-CM | POA: Diagnosis not present

## 2023-06-10 DIAGNOSIS — E559 Vitamin D deficiency, unspecified: Secondary | ICD-10-CM | POA: Diagnosis not present

## 2023-06-10 DIAGNOSIS — J449 Chronic obstructive pulmonary disease, unspecified: Secondary | ICD-10-CM | POA: Diagnosis not present

## 2023-06-10 DIAGNOSIS — Z6841 Body Mass Index (BMI) 40.0 and over, adult: Secondary | ICD-10-CM | POA: Diagnosis not present

## 2023-06-10 DIAGNOSIS — I428 Other cardiomyopathies: Secondary | ICD-10-CM | POA: Diagnosis not present

## 2023-06-10 DIAGNOSIS — I10 Essential (primary) hypertension: Secondary | ICD-10-CM | POA: Diagnosis not present

## 2023-06-10 DIAGNOSIS — E782 Mixed hyperlipidemia: Secondary | ICD-10-CM | POA: Diagnosis not present

## 2023-06-10 DIAGNOSIS — E66813 Obesity, class 3: Secondary | ICD-10-CM | POA: Diagnosis not present

## 2023-06-10 DIAGNOSIS — M4800 Spinal stenosis, site unspecified: Secondary | ICD-10-CM | POA: Diagnosis not present

## 2023-06-10 DIAGNOSIS — Z862 Personal history of diseases of the blood and blood-forming organs and certain disorders involving the immune mechanism: Secondary | ICD-10-CM | POA: Diagnosis not present

## 2023-06-10 DIAGNOSIS — Z131 Encounter for screening for diabetes mellitus: Secondary | ICD-10-CM | POA: Diagnosis not present

## 2023-06-10 DIAGNOSIS — I4719 Other supraventricular tachycardia: Secondary | ICD-10-CM | POA: Diagnosis not present

## 2023-06-14 DIAGNOSIS — M51362 Other intervertebral disc degeneration, lumbar region with discogenic back pain and lower extremity pain: Secondary | ICD-10-CM | POA: Diagnosis not present

## 2023-06-14 DIAGNOSIS — M48061 Spinal stenosis, lumbar region without neurogenic claudication: Secondary | ICD-10-CM | POA: Diagnosis not present

## 2023-06-14 DIAGNOSIS — M47816 Spondylosis without myelopathy or radiculopathy, lumbar region: Secondary | ICD-10-CM | POA: Diagnosis not present

## 2023-06-14 DIAGNOSIS — M4726 Other spondylosis with radiculopathy, lumbar region: Secondary | ICD-10-CM | POA: Diagnosis not present

## 2023-06-15 DIAGNOSIS — I495 Sick sinus syndrome: Secondary | ICD-10-CM | POA: Diagnosis not present

## 2023-06-15 DIAGNOSIS — Z950 Presence of cardiac pacemaker: Secondary | ICD-10-CM | POA: Diagnosis not present

## 2023-06-22 DIAGNOSIS — E66813 Obesity, class 3: Secondary | ICD-10-CM | POA: Diagnosis not present

## 2023-06-22 DIAGNOSIS — Z7182 Exercise counseling: Secondary | ICD-10-CM | POA: Diagnosis not present

## 2023-06-22 DIAGNOSIS — Z7689 Persons encountering health services in other specified circumstances: Secondary | ICD-10-CM | POA: Diagnosis not present

## 2023-06-22 DIAGNOSIS — Z1331 Encounter for screening for depression: Secondary | ICD-10-CM | POA: Diagnosis not present

## 2023-06-22 DIAGNOSIS — Z713 Dietary counseling and surveillance: Secondary | ICD-10-CM | POA: Diagnosis not present

## 2023-06-22 DIAGNOSIS — R7303 Prediabetes: Secondary | ICD-10-CM | POA: Diagnosis not present

## 2023-06-22 DIAGNOSIS — Z6841 Body Mass Index (BMI) 40.0 and over, adult: Secondary | ICD-10-CM | POA: Diagnosis not present

## 2023-06-22 DIAGNOSIS — I10 Essential (primary) hypertension: Secondary | ICD-10-CM | POA: Diagnosis not present

## 2023-06-28 DIAGNOSIS — E66813 Obesity, class 3: Secondary | ICD-10-CM | POA: Diagnosis not present

## 2023-06-28 DIAGNOSIS — Z6841 Body Mass Index (BMI) 40.0 and over, adult: Secondary | ICD-10-CM | POA: Diagnosis not present

## 2023-06-28 DIAGNOSIS — Z713 Dietary counseling and surveillance: Secondary | ICD-10-CM | POA: Diagnosis not present

## 2023-06-29 DIAGNOSIS — R519 Headache, unspecified: Secondary | ICD-10-CM | POA: Diagnosis not present

## 2023-06-29 DIAGNOSIS — H538 Other visual disturbances: Secondary | ICD-10-CM | POA: Diagnosis not present

## 2023-06-29 DIAGNOSIS — I480 Paroxysmal atrial fibrillation: Secondary | ICD-10-CM | POA: Diagnosis not present

## 2023-06-29 DIAGNOSIS — Z8673 Personal history of transient ischemic attack (TIA), and cerebral infarction without residual deficits: Secondary | ICD-10-CM | POA: Diagnosis not present

## 2023-06-29 DIAGNOSIS — G4733 Obstructive sleep apnea (adult) (pediatric): Secondary | ICD-10-CM | POA: Diagnosis not present

## 2023-06-29 DIAGNOSIS — R413 Other amnesia: Secondary | ICD-10-CM | POA: Diagnosis not present

## 2023-06-30 DIAGNOSIS — G4733 Obstructive sleep apnea (adult) (pediatric): Secondary | ICD-10-CM | POA: Diagnosis not present

## 2023-06-30 DIAGNOSIS — I10 Essential (primary) hypertension: Secondary | ICD-10-CM | POA: Diagnosis not present

## 2023-06-30 DIAGNOSIS — K047 Periapical abscess without sinus: Secondary | ICD-10-CM | POA: Diagnosis not present

## 2023-07-05 DIAGNOSIS — E66813 Obesity, class 3: Secondary | ICD-10-CM | POA: Diagnosis not present

## 2023-07-05 DIAGNOSIS — Z6841 Body Mass Index (BMI) 40.0 and over, adult: Secondary | ICD-10-CM | POA: Diagnosis not present

## 2023-07-05 DIAGNOSIS — Z713 Dietary counseling and surveillance: Secondary | ICD-10-CM | POA: Diagnosis not present

## 2023-07-06 ENCOUNTER — Ambulatory Visit

## 2023-07-06 DIAGNOSIS — M5416 Radiculopathy, lumbar region: Secondary | ICD-10-CM | POA: Diagnosis not present

## 2023-07-06 NOTE — Therapy (Signed)
 OUTPATIENT PHYSICAL THERAPY THORACOLUMBAR EVALUATION   Patient Name: Alison Wilkinson MRN: [id number] DOB:[birth year], 53 y.o., female Today's Date: 07/06/2023  END OF SESSION:  PT End of Session - 07/06/23 0927     Visit Number 1    Number of Visits 8    Date for PT Re-Evaluation 08/26/23    PT Start Time 0930    PT Stop Time 1008    PT Time Calculation (min) 38 min    Activity Tolerance Patient limited by pain    Behavior During Therapy [facility] for tasks assessed/performed             Past Medical History:  Diagnosis Date   A-fib (HCC)    Anxiety    Atrial tachycardia (HCC)    Cancer (HCC)    thyroid   Cardiomyopathy (HCC)    COPD (chronic obstructive pulmonary disease) (HCC)    Dyspnea    Dysrhythmia    GERD (gastroesophageal reflux disease)    Headache    High cholesterol    History of kidney stones    Hypertension    Hyperthyroidism    Hypothyroid    Mixed hyperlipidemia    Pacemaker    biotronic  PPM   [facility]    Pacemaker    Pneumonia    hx   PONV (postoperative nausea and vomiting)    Shortness of breath dyspnea    WITH EXERTION    Sleep apnea    mild case no cpap reccommended   SSS (sick sinus syndrome) (HCC)    Stroke (HCC)    2009   afib  (none in years)   Syncope    Past Surgical History:  Procedure Laterality Date   CARDIAC CATHETERIZATION     8/16   HYSTEROSCOPY  2015   MULTIPLE EXTRACTIONS WITH ALVEOLOPLASTY Bilateral 08/13/2016   Procedure: MULTIPLE EXTRACTIONS;  Surgeon: Ocie Doyne, DDS;  Location: [facility] OR;  Service: Oral Surgery;  Laterality: Bilateral;   PACEMAKER INSERTION  2008   THYROIDECTOMY Bilateral 04/25/2015   Procedure: THYROIDECTOMY;  Surgeon: Christia Reading, MD;  Location: [facility] OR;  Service: ENT;  Laterality: Bilateral;  TOTAL THYROIDECTOMY   TUMOR REMOVAL  2013   UTERUS   Patient Active Problem List   Diagnosis Date Noted   Acute left-sided weakness 07/02/2019   Left-sided weakness 07/01/2019    Paresthesia 02/01/2019   GERD (gastroesophageal reflux disease) 09/21/2018   Other fatigue 06/09/2017   Chronic atrial fibrillation (HCC) 06/09/2017   Other specified hypothyroidism 06/09/2017   Vitamin D deficiency 06/09/2017   B12 nutritional deficiency 06/09/2017   Hyperglycemia 06/09/2017   Chronic obstructive pulmonary disease (HCC) 06/09/2017   Pacemaker 05/27/2016   Acute low back pain 04/09/2016   Solitary pulmonary nodule 02/05/2016   History of thyroid cancer 01/08/2016   Tobacco abuse 05/12/2015   History of colonic polyps 05/12/2015   Hypocalcemia 04/28/2015   H/O total thyroidectomy 04/28/2015   Hypomagnesemia 04/28/2015   Epigastric abdominal pain 04/17/2015   Hyperthyroidism 11/12/2014   Nonischemic cardiomyopathy (HCC) 11/12/2014   Mixed hyperlipidemia 11/12/2014   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/11/2014   Sick sinus syndrome (HCC) 11/11/2014   Anxiety state 11/11/2014   History of CVA (cerebrovascular accident) 11/11/2014   Essential hypertension 05/28/2014   REFERRING PROVIDER: Ezzard Standing, Earney Navy, NP   REFERRING DIAG: Other spondylosis with radiculopathy, lumbar region; Other intervertebral disc degeneration, lumbar region with discogenic back pain and lower extremity pain; Spondylosis without myelopathy or radiculopathy, lumbar region  Rationale for Evaluation and Treatment: Rehabilitation  THERAPY DIAG:  Radiculopathy, lumbar region  ONSET DATE: 1-2 years ago   SUBJECTIVE:                                                                                                                                                                                           SUBJECTIVE STATEMENT: Patient reports that her back has been bothering her for about a year or two with no known cause. Her pain starts in her low back and then radiates down her leg and then wraps around to the front of her foot. Her pain is primarily down her right leg, but it can go down her left leg. However,  it has never gone down both legs at the same time. She has not had any major injuries to her back previously.   PERTINENT HISTORY:  Hypertension, atrial fibrillation, COPD, anxiety, history of a CVA, and history of cancer  PAIN:  Are you having pain? Yes: NPRS scale: Current: 4/10 Best: 4/10 Worst: 10/10 Pain location: low back and both legs (R>L) Pain description: "heaviness", ache, throbbing, and shooting Aggravating factors: prolonged standing, sitting, or walking (about 10 minutes), any pressure on her legs, bending, putting her socks on Relieving factors: heat  PRECAUTIONS: Fall and ICD/Pacemaker  RED FLAGS: None   WEIGHT BEARING RESTRICTIONS: No  FALLS:  Has patient fallen in last 6 months? Yes. Number of falls 5; all five falls were in the past month, she is seeing a neurologist due to the recent increase in falling  LIVING ENVIRONMENT: Lives with: lives with their family Lives in: House/apartment Stairs: Yes: External: 2 steps; none; step to pattern while holding a pole for safety Has following equipment at home: Single point cane  OCCUPATION: disabled  PLOF: Independent  PATIENT GOALS: reduced pain, be able to help take care of her grandchildren  NEXT MD VISIT: 07/20/23  OBJECTIVE:  Note: Objective measures were completed at Evaluation unless otherwise noted.  PATIENT SURVEYS:  Modified Oswestry 40% disability   COGNITION: Overall cognitive status: Within functional limits for tasks assessed     SENSATION: Patient reports numbness in her right foot and lower leg. Absent sensation to touch in right gluteals  POSTURE: increased lumbar lordosis  PALPATION: TTP: bilateral lumbar paraspinals, right QL, gluteals, and piriformis  JOINT MOBILITY:  L1-3: WFL and nonpainful   L3-5: hypomobile and painful   LUMBAR ROM: all motions were limited by pain  AROM eval  Flexion 38  Extension 10; worst pain  Right lateral flexion 50% limited   Left lateral flexion  50% limited  Right rotation 75% limited  Left rotation 50% limited   (Blank rows = not tested)  LOWER EXTREMITY ROM: WFL for activities assessed  LOWER EXTREMITY MMT: unable to accurately assess due to pain irritability  MMT Right eval Left eval  Hip flexion    Hip extension    Hip abduction    Hip adduction    Hip internal rotation    Hip external rotation    Knee flexion    Knee extension    Ankle dorsiflexion    Ankle plantarflexion    Ankle inversion    Ankle eversion     (Blank rows = not tested)  GAIT: Assistive device utilized: None Level of assistance: Complete Independence Comments: decreased gait speed, stride length, and poor foot clearance bilaterally  TREATMENT DATE:                                                                                                                                  PATIENT EDUCATION:  Education details: Plan of care, objective findings, referred pain, anatomy, and goals for physical therapy Person educated: Patient Education method: Explanation Education comprehension: verbalized understanding  HOME EXERCISE PROGRAM:   ASSESSMENT:  CLINICAL IMPRESSION: Patient is a 53 y.o. female who was seen today for physical therapy evaluation and treatment for chronic lumbar radiculopathy. She presented with moderate pain severity and high irritability with all of today's assessments aggravating her familiar symptoms. This high symptom irritability prevented assessments such as manual muscle testing from being performed at that time. Recommend that she continue with skilled physical therapy to address her remaining impairments to maximize her functional mobility.    OBJECTIVE IMPAIRMENTS: Abnormal gait, decreased activity tolerance, decreased balance, decreased mobility, difficulty walking, decreased ROM, hypomobility, impaired sensation, impaired tone, postural dysfunction, and pain.   ACTIVITY LIMITATIONS: carrying, lifting, bending,  sitting, standing, stairs, transfers, locomotion level, and caring for others  PARTICIPATION LIMITATIONS: meal prep, cleaning, laundry, shopping, and community activity  PERSONAL FACTORS: Past/current experiences, Time since onset of injury/illness/exacerbation, and 3+ comorbidities: Hypertension, atrial fibrillation, COPD, anxiety, history of a CVA, and history of cancer  are also affecting patient's functional outcome.   REHAB POTENTIAL: Fair    CLINICAL DECISION MAKING: Unstable/unpredictable  EVALUATION COMPLEXITY: High   GOALS: Goals reviewed with patient? Yes  LONG TERM GOALS: Target date: 08/03/23  Patient will be independent with her HEP. Baseline:  Goal status: INITIAL  2.  Patient will be able to complete her daily activities without her familiar symptoms exceeded 8/10. Baseline:  Goal status: INITIAL  3.  Patient will improve her modified Oswestry disability index to 30% or less for improved perceived function with her daily activities. Baseline:  Goal status: INITIAL  4.  Patient will report being able to stand for at least 20 minutes without being limited by her familiar symptoms for improved function participating in activities with her grandchildren. Baseline:  Goal status: INITIAL  PLAN:  PT FREQUENCY: 2x/week  PT DURATION:  4 weeks  PLANNED INTERVENTIONS: 97164- PT Re-evaluation, 97110-Therapeutic exercises, 97530- Therapeutic activity, 97112- Neuromuscular re-education, [phone]- Self Care, 17616- Manual therapy, [phone]- Gait training, Patient/Family education, Balance training, Stair training, Joint mobilization, and Spinal mobilization.  PLAN FOR NEXT SESSION: NuStep, isometrics, manual therapy, and provide HEP   Granville Lewis, PT 07/06/2023, 4:00 PM

## 2023-07-08 DIAGNOSIS — R7303 Prediabetes: Secondary | ICD-10-CM | POA: Diagnosis not present

## 2023-07-08 DIAGNOSIS — E890 Postprocedural hypothyroidism: Secondary | ICD-10-CM | POA: Diagnosis not present

## 2023-07-08 DIAGNOSIS — C73 Malignant neoplasm of thyroid gland: Secondary | ICD-10-CM | POA: Diagnosis not present

## 2023-07-10 DIAGNOSIS — I10 Essential (primary) hypertension: Secondary | ICD-10-CM | POA: Diagnosis not present

## 2023-07-10 DIAGNOSIS — G4733 Obstructive sleep apnea (adult) (pediatric): Secondary | ICD-10-CM | POA: Diagnosis not present

## 2023-07-13 ENCOUNTER — Ambulatory Visit

## 2023-07-13 DIAGNOSIS — E538 Deficiency of other specified B group vitamins: Secondary | ICD-10-CM | POA: Diagnosis not present

## 2023-07-13 DIAGNOSIS — M5416 Radiculopathy, lumbar region: Secondary | ICD-10-CM

## 2023-07-13 NOTE — Therapy (Addendum)
 OUTPATIENT PHYSICAL THERAPY THORACOLUMBAR TREATMENT   Patient Name: Alison Wilkinson MRN: [id number] DOB:06-11-1970, 53 y.o., female Today's Date: 07/13/2023  END OF SESSION:  PT End of Session - 07/13/23 0848     Visit Number 2    Number of Visits 8    Date for PT Re-Evaluation 08/26/23    Authorization Time Period 07/07/23-09/04/23    Authorization - Number of Visits 6     PT Start Time 0845    PT Stop Time 0924    PT Time Calculation (min) 39 min    Activity Tolerance Patient limited by pain    Behavior During Therapy [facility] for tasks assessed/performed             Past Medical History:  Diagnosis Date   A-fib (HCC)    Anxiety    Atrial tachycardia (HCC)    Cancer (HCC)    thyroid   Cardiomyopathy (HCC)    COPD (chronic obstructive pulmonary disease) (HCC)    Dyspnea    Dysrhythmia    GERD (gastroesophageal reflux disease)    Headache    High cholesterol    History of kidney stones    Hypertension    Hyperthyroidism    Hypothyroid    Mixed hyperlipidemia    Pacemaker    biotronic  PPM   [facility]    Pacemaker    Pneumonia    hx   PONV (postoperative nausea and vomiting)    Shortness of breath dyspnea    WITH EXERTION    Sleep apnea    mild case no cpap reccommended   SSS (sick sinus syndrome) (HCC)    Stroke (HCC)    2009   afib  (none in years)   Syncope    Past Surgical History:  Procedure Laterality Date   CARDIAC CATHETERIZATION     8/16   HYSTEROSCOPY  2015   MULTIPLE EXTRACTIONS WITH ALVEOLOPLASTY Bilateral 08/13/2016   Procedure: MULTIPLE EXTRACTIONS;  Surgeon: Ascencion Lava, DDS;  Location: [facility] OR;  Service: Oral Surgery;  Laterality: Bilateral;   PACEMAKER INSERTION  2008   THYROIDECTOMY Bilateral 04/25/2015   Procedure: THYROIDECTOMY;  Surgeon: Virgina Grills, MD;  Location: [facility] OR;  Service: ENT;  Laterality: Bilateral;  TOTAL THYROIDECTOMY   TUMOR REMOVAL  2013   UTERUS   Patient Active Problem List   Diagnosis Date Noted    Acute left-sided weakness 07/02/2019   Left-sided weakness 07/01/2019   Paresthesia 02/01/2019   GERD (gastroesophageal reflux disease) 09/21/2018   Other fatigue 06/09/2017   Chronic atrial fibrillation (HCC) 06/09/2017   Other specified hypothyroidism 06/09/2017   Vitamin D deficiency 06/09/2017   B12 nutritional deficiency 06/09/2017   Hyperglycemia 06/09/2017   Chronic obstructive pulmonary disease (HCC) 06/09/2017   Pacemaker 05/27/2016   Acute low back pain 04/09/2016   Solitary pulmonary nodule 02/05/2016   History of thyroid cancer 01/08/2016   Tobacco abuse 05/12/2015   History of colonic polyps 05/12/2015   Hypocalcemia 04/28/2015   H/O total thyroidectomy 04/28/2015   Hypomagnesemia 04/28/2015   Epigastric abdominal pain 04/17/2015   Hyperthyroidism 11/12/2014   Nonischemic cardiomyopathy (HCC) 11/12/2014   Mixed hyperlipidemia 11/12/2014   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/11/2014   Sick sinus syndrome (HCC) 11/11/2014   Anxiety state 11/11/2014   History of CVA (cerebrovascular accident) 11/11/2014   Essential hypertension 05/28/2014   REFERRING PROVIDER: Samul Croft, Cherryl Corona, NP   REFERRING DIAG: Other spondylosis with radiculopathy, lumbar region; Other intervertebral disc degeneration, lumbar region  with discogenic back pain and lower extremity pain; Spondylosis without myelopathy or radiculopathy, lumbar region   Rationale for Evaluation and Treatment: Rehabilitation  THERAPY DIAG:  Radiculopathy, lumbar region  ONSET DATE: 1-2 years ago   SUBJECTIVE:                                                                                                                                                                                           SUBJECTIVE STATEMENT: Pt reports 5/10 low back pain today.  Pt states that she is having a CT scan on her cervical spine Monday.  PERTINENT HISTORY:  Hypertension, atrial fibrillation, COPD, anxiety, history of a CVA, and history of  cancer  PAIN:  Are you having pain? Yes: NPRS scale: 5/10 Pain location: low back and both legs (R>L) Pain description: "heaviness", ache, throbbing, and shooting Aggravating factors: prolonged standing, sitting, or walking (about 10 minutes), any pressure on her legs, bending, putting her socks on Relieving factors: heat  PRECAUTIONS: Fall and ICD/Pacemaker  RED FLAGS: None   WEIGHT BEARING RESTRICTIONS: No  FALLS:  Has patient fallen in last 6 months? Yes. Number of falls 5; all five falls were in the past month, she is seeing a neurologist due to the recent increase in falling  LIVING ENVIRONMENT: Lives with: lives with their family Lives in: House/apartment Stairs: Yes: External: 2 steps; none; step to pattern while holding a pole for safety Has following equipment at home: Single point cane  OCCUPATION: disabled  PLOF: Independent  PATIENT GOALS: reduced pain, be able to help take care of her grandchildren  NEXT MD VISIT: 07/20/23  OBJECTIVE:  Note: Objective measures were completed at Evaluation unless otherwise noted.  PATIENT SURVEYS:  Modified Oswestry 40% disability   COGNITION: Overall cognitive status: Within functional limits for tasks assessed     SENSATION: Patient reports numbness in her right foot and lower leg. Absent sensation to touch in right gluteals  POSTURE: increased lumbar lordosis  PALPATION: TTP: bilateral lumbar paraspinals, right QL, gluteals, and piriformis  JOINT MOBILITY:  L1-3: WFL and nonpainful   L3-5: hypomobile and painful   LUMBAR ROM: all motions were limited by pain  AROM eval  Flexion 38  Extension 10; worst pain  Right lateral flexion 50% limited   Left lateral flexion 50% limited  Right rotation 75% limited  Left rotation 50% limited   (Blank rows = not tested)  LOWER EXTREMITY ROM: WFL for activities assessed  LOWER EXTREMITY MMT: unable to accurately assess due to pain irritability  MMT Right eval  Left eval  Hip flexion    Hip extension  Hip abduction    Hip adduction    Hip internal rotation    Hip external rotation    Knee flexion    Knee extension    Ankle dorsiflexion    Ankle plantarflexion    Ankle inversion    Ankle eversion     (Blank rows = not tested)  GAIT: Assistive device utilized: None Level of assistance: Complete Independence Comments: decreased gait speed, stride length, and poor foot clearance bilaterally  TREATMENT DATE:                                                                                                                              07/13/23                                  EXERCISE LOG  Exercise Repetitions and Resistance Comments  Nustep Lvl 3 x 15 mins   Rockerboard    [employer] 1 mins   Ball Rollout         Blank cell = exercise not performed today   Manual Therapy Soft Tissue Mobilization: Low back, STW/M to bil lumbar paraspinals and Qls to decrease pain and tone.  Pt seated and leaning over elevated treatment table with pillow for comfort    PATIENT EDUCATION:  Education details: Plan of care, objective findings, referred pain, anatomy, and goals for physical therapy Person educated: Patient Education method: Explanation Education comprehension: verbalized understanding  HOME EXERCISE PROGRAM:   ASSESSMENT:  CLINICAL IMPRESSION: Pt arrives for today's treatment session reporting 5/10 low back pain.  Pt also reports pain in cervical spine and bil hips.  Pt able to tolerate Nustep for warm up today with minimal discomfort noted.  Pt only able to tolerate 1 min of seated ball press due to increased pain in BUEs and low back.  STW/M performed to bil lumbar paraspinals and QL to decrease pain and tone.  Pt reported minimal decrease in pain at completion of today's treatment session.  Pt has CT of cervical spine scheduled for Monday and a doctor's appointment to receive hip injections on Wednesday.    OBJECTIVE IMPAIRMENTS:  Abnormal gait, decreased activity tolerance, decreased balance, decreased mobility, difficulty walking, decreased ROM, hypomobility, impaired sensation, impaired tone, postural dysfunction, and pain.   ACTIVITY LIMITATIONS: carrying, lifting, bending, sitting, standing, stairs, transfers, locomotion level, and caring for others  PARTICIPATION LIMITATIONS: meal prep, cleaning, laundry, shopping, and community activity  PERSONAL FACTORS: Past/current experiences, Time since onset of injury/illness/exacerbation, and 3+ comorbidities: Hypertension, atrial fibrillation, COPD, anxiety, history of a CVA, and history of cancer  are also affecting patient's functional outcome.   REHAB POTENTIAL: Fair    CLINICAL DECISION MAKING: Unstable/unpredictable  EVALUATION COMPLEXITY: High   GOALS: Goals reviewed with patient? Yes  LONG TERM GOALS: Target date: 08/03/23  Patient will be independent with her HEP. Baseline:  Goal status: INITIAL  2.  Patient will be able to complete her daily activities without her familiar symptoms exceeded 8/10. Baseline:  Goal status: INITIAL  3.  Patient will improve her modified Oswestry disability index to 30% or less for improved perceived function with her daily activities. Baseline:  Goal status: INITIAL  4.  Patient will report being able to stand for at least 20 minutes without being limited by her familiar symptoms for improved function participating in activities with her grandchildren. Baseline:  Goal status: INITIAL  PLAN:  PT FREQUENCY: 2x/week  PT DURATION: 4 weeks  PLANNED INTERVENTIONS: 97164- PT Re-evaluation, 97110-Therapeutic exercises, 97530- Therapeutic activity, 97112- Neuromuscular re-education, 97535- Self Care, 16109- Manual therapy, [phone]- Gait training, Patient/Family education, Balance training, Stair training, Joint mobilization, and Spinal mobilization.  PLAN FOR NEXT SESSION: NuStep, isometrics, manual therapy, and provide  HEP   Deryl Flora, PTA 07/13/2023, 10:01 AM

## 2023-07-19 DIAGNOSIS — E538 Deficiency of other specified B group vitamins: Secondary | ICD-10-CM | POA: Diagnosis not present

## 2023-07-21 ENCOUNTER — Encounter

## 2023-07-21 DIAGNOSIS — B351 Tinea unguium: Secondary | ICD-10-CM | POA: Diagnosis not present

## 2023-07-21 DIAGNOSIS — K047 Periapical abscess without sinus: Secondary | ICD-10-CM | POA: Diagnosis not present

## 2023-07-22 DIAGNOSIS — R519 Headache, unspecified: Secondary | ICD-10-CM | POA: Diagnosis not present

## 2023-07-22 DIAGNOSIS — R413 Other amnesia: Secondary | ICD-10-CM | POA: Diagnosis not present

## 2023-07-22 DIAGNOSIS — H538 Other visual disturbances: Secondary | ICD-10-CM | POA: Diagnosis not present

## 2023-07-22 DIAGNOSIS — I480 Paroxysmal atrial fibrillation: Secondary | ICD-10-CM | POA: Diagnosis not present

## 2023-07-22 DIAGNOSIS — Z8673 Personal history of transient ischemic attack (TIA), and cerebral infarction without residual deficits: Secondary | ICD-10-CM | POA: Diagnosis not present

## 2023-07-25 DIAGNOSIS — R638 Other symptoms and signs concerning food and fluid intake: Secondary | ICD-10-CM | POA: Diagnosis not present

## 2023-07-26 DIAGNOSIS — Z9089 Acquired absence of other organs: Secondary | ICD-10-CM | POA: Diagnosis not present

## 2023-07-26 DIAGNOSIS — C73 Malignant neoplasm of thyroid gland: Secondary | ICD-10-CM | POA: Diagnosis not present

## 2023-07-26 DIAGNOSIS — R911 Solitary pulmonary nodule: Secondary | ICD-10-CM | POA: Diagnosis not present

## 2023-07-27 ENCOUNTER — Ambulatory Visit

## 2023-07-27 DIAGNOSIS — E538 Deficiency of other specified B group vitamins: Secondary | ICD-10-CM | POA: Diagnosis not present

## 2023-07-27 DIAGNOSIS — M5416 Radiculopathy, lumbar region: Secondary | ICD-10-CM | POA: Diagnosis not present

## 2023-07-27 NOTE — Therapy (Signed)
 OUTPATIENT PHYSICAL THERAPY THORACOLUMBAR TREATMENT   Patient Name: Alison Wilkinson MRN: [id number] DOB:15-Feb-1971, 53 y.o., female Today's Date: 07/27/2023  END OF SESSION:  PT End of Session - 07/27/23 0951     Visit Number 3    Number of Visits 8    Date for PT Re-Evaluation 08/26/23    Authorization Time Period 07/07/23-09/04/23    Authorization - Number of Visits 6    PT Start Time 0930    PT Stop Time 0947    PT Time Calculation (min) 17 min    Activity Tolerance Patient limited by pain    Behavior During Therapy [facility] for tasks assessed/performed              Past Medical History:  Diagnosis Date   A-fib (HCC)    Anxiety    Atrial tachycardia (HCC)    Cancer (HCC)    thyroid    Cardiomyopathy (HCC)    COPD (chronic obstructive pulmonary disease) (HCC)    Dyspnea    Dysrhythmia    GERD (gastroesophageal reflux disease)    Headache    High cholesterol    History of kidney stones    Hypertension    Hyperthyroidism    Hypothyroid    Mixed hyperlipidemia    Pacemaker    biotronic  PPM   [facility]    Pacemaker    Pneumonia    hx   PONV (postoperative nausea and vomiting)    Shortness of breath dyspnea    WITH EXERTION    Sleep apnea    mild case no cpap reccommended   SSS (sick sinus syndrome) (HCC)    Stroke (HCC)    2009   afib  (none in years)   Syncope    Past Surgical History:  Procedure Laterality Date   CARDIAC CATHETERIZATION     8/16   HYSTEROSCOPY  2015   MULTIPLE EXTRACTIONS WITH ALVEOLOPLASTY Bilateral 08/13/2016   Procedure: MULTIPLE EXTRACTIONS;  Surgeon: Ascencion Lava, DDS;  Location: [facility] OR;  Service: Oral Surgery;  Laterality: Bilateral;   PACEMAKER INSERTION  2008   THYROIDECTOMY Bilateral 04/25/2015   Procedure: THYROIDECTOMY;  Surgeon: Virgina Grills, MD;  Location: [facility];  Service: ENT;  Laterality: Bilateral;  TOTAL THYROIDECTOMY   TUMOR REMOVAL  2013   UTERUS   Patient Active Problem List   Diagnosis Date Noted    Acute left-sided weakness 07/02/2019   Left-sided weakness 07/01/2019   Paresthesia 02/01/2019   GERD (gastroesophageal reflux disease) 09/21/2018   Other fatigue 06/09/2017   Chronic atrial fibrillation (HCC) 06/09/2017   Other specified hypothyroidism 06/09/2017   Vitamin D  deficiency 06/09/2017   B12 nutritional deficiency 06/09/2017   Hyperglycemia 06/09/2017   Chronic obstructive pulmonary disease (HCC) 06/09/2017   Pacemaker 05/27/2016   Acute low back pain 04/09/2016   Solitary pulmonary nodule 02/05/2016   History of thyroid  cancer 01/08/2016   Tobacco abuse 05/12/2015   History of colonic polyps 05/12/2015   Hypocalcemia 04/28/2015   H/O total thyroidectomy 04/28/2015   Hypomagnesemia 04/28/2015   Epigastric abdominal pain 04/17/2015   Hyperthyroidism 11/12/2014   Nonischemic cardiomyopathy (HCC) 11/12/2014   Mixed hyperlipidemia 11/12/2014   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/11/2014   Sick sinus syndrome (HCC) 11/11/2014   Anxiety state 11/11/2014   History of CVA (cerebrovascular accident) 11/11/2014   Essential hypertension 05/28/2014   REFERRING PROVIDER: Samul Croft, Cherryl Corona, NP   REFERRING DIAG: Other spondylosis with radiculopathy, lumbar region; Other intervertebral disc degeneration, lumbar region  with discogenic back pain and lower extremity pain; Spondylosis without myelopathy or radiculopathy, lumbar region   Rationale for Evaluation and Treatment: Rehabilitation  THERAPY DIAG:  Radiculopathy, lumbar region  ONSET DATE: 1-2 years ago   SUBJECTIVE:                                                                                                                                                                                           SUBJECTIVE STATEMENT: Patient reports that she feels "so so" today. She has been trying to pass a kidney stone for 4-5 days now.   PERTINENT HISTORY:  Hypertension, atrial fibrillation, COPD, anxiety, history of a CVA, and history of  cancer  PAIN:  Are you having pain? Yes: NPRS scale: 8/10 Pain location: low back and both legs (R>L) Pain description: "heaviness", ache, throbbing, and shooting Aggravating factors: prolonged standing, sitting, or walking (about 10 minutes), any pressure on her legs, bending, putting her socks on Relieving factors: heat  PRECAUTIONS: Fall and ICD/Pacemaker  RED FLAGS: None   WEIGHT BEARING RESTRICTIONS: No  FALLS:  Has patient fallen in last 6 months? Yes. Number of falls 5; all five falls were in the past month, she is seeing a neurologist due to the recent increase in falling  LIVING ENVIRONMENT: Lives with: lives with their family Lives in: House/apartment Stairs: Yes: External: 2 steps; none; step to pattern while holding a pole for safety Has following equipment at home: Single point cane  OCCUPATION: disabled  PLOF: Independent  PATIENT GOALS: reduced pain, be able to help take care of her grandchildren  NEXT MD VISIT: 07/20/23  OBJECTIVE:  Note: Objective measures were completed at Evaluation unless otherwise noted.  PATIENT SURVEYS:  Modified Oswestry 40% disability   COGNITION: Overall cognitive status: Within functional limits for tasks assessed     SENSATION: Patient reports numbness in her right foot and lower leg. Absent sensation to touch in right gluteals  POSTURE: increased lumbar lordosis  PALPATION: TTP: bilateral lumbar paraspinals, right QL, gluteals, and piriformis  JOINT MOBILITY:  L1-3: WFL and nonpainful   L3-5: hypomobile and painful   LUMBAR ROM: all motions were limited by pain  AROM eval  Flexion 38  Extension 10; worst pain  Right lateral flexion 50% limited   Left lateral flexion 50% limited  Right rotation 75% limited  Left rotation 50% limited   (Blank rows = not tested)  LOWER EXTREMITY ROM: WFL for activities assessed  LOWER EXTREMITY MMT: unable to accurately assess due to pain irritability  MMT Right eval  Left eval  Hip flexion    Hip extension  Hip abduction    Hip adduction    Hip internal rotation    Hip external rotation    Knee flexion    Knee extension    Ankle dorsiflexion    Ankle plantarflexion    Ankle inversion    Ankle eversion     (Blank rows = not tested)  GAIT: Assistive device utilized: None Level of assistance: Complete Independence Comments: decreased gait speed, stride length, and poor foot clearance bilaterally  TREATMENT DATE:                                                                                                                                                               07/27/23 EXERCISE LOG  Exercise Repetitions and Resistance Comments  Nustep  L3 x 15 minutes    Blank cell = exercise not performed today   07/13/23                                  EXERCISE LOG  Exercise Repetitions and Resistance Comments  Nustep Lvl 3 x 15 mins   Rockerboard    [employer] 1 mins   Ball Rollout         Blank cell = exercise not performed today   Manual Therapy Soft Tissue Mobilization: Low back, STW/M to bil lumbar paraspinals and Qls to decrease pain and tone.  Pt seated and leaning over elevated treatment table with pillow for comfort    PATIENT EDUCATION:  Education details:  Person educated: Patient Education method: Explanation Education comprehension: verbalized understanding  HOME EXERCISE PROGRAM:   ASSESSMENT:  CLINICAL IMPRESSION: Patient was unable to complete her full appointment as she requested to leave early due to increased pain which she attributed to a kidney stone. She reported that she was "done" after completing the NuStep today. Recommend that she continue with skilled physical therapy to address her remaining impairments to maximize her functional mobility.   OBJECTIVE IMPAIRMENTS: Abnormal gait, decreased activity tolerance, decreased balance, decreased mobility, difficulty walking, decreased ROM, hypomobility, impaired  sensation, impaired tone, postural dysfunction, and pain.   ACTIVITY LIMITATIONS: carrying, lifting, bending, sitting, standing, stairs, transfers, locomotion level, and caring for others  PARTICIPATION LIMITATIONS: meal prep, cleaning, laundry, shopping, and community activity  PERSONAL FACTORS: Past/current experiences, Time since onset of injury/illness/exacerbation, and 3+ comorbidities: Hypertension, atrial fibrillation, COPD, anxiety, history of a CVA, and history of cancer  are also affecting patient's functional outcome.   REHAB POTENTIAL: Fair    CLINICAL DECISION MAKING: Unstable/unpredictable  EVALUATION COMPLEXITY: High   GOALS: Goals reviewed with patient? Yes  LONG TERM GOALS: Target date: 08/03/23  Patient will be independent with her HEP. Baseline:  Goal status: INITIAL  2.  Patient  will be able to complete her daily activities without her familiar symptoms exceeded 8/10. Baseline:  Goal status: INITIAL  3.  Patient will improve her modified Oswestry disability index to 30% or less for improved perceived function with her daily activities. Baseline:  Goal status: INITIAL  4.  Patient will report being able to stand for at least 20 minutes without being limited by her familiar symptoms for improved function participating in activities with her grandchildren. Baseline:  Goal status: INITIAL  PLAN:  PT FREQUENCY: 2x/week  PT DURATION: 4 weeks  PLANNED INTERVENTIONS: 97164- PT Re-evaluation, 97110-Therapeutic exercises, 97530- Therapeutic activity, 97112- Neuromuscular re-education, 97535- Self Care, 16109- Manual therapy, [phone]- Gait training, Patient/Family education, Balance training, Stair training, Joint mobilization, and Spinal mobilization.  PLAN FOR NEXT SESSION: NuStep, isometrics, manual therapy, and provide HEP   Lane Pinon, PT 07/27/2023, 10:05 AM

## 2023-07-28 DIAGNOSIS — I428 Other cardiomyopathies: Secondary | ICD-10-CM | POA: Diagnosis not present

## 2023-07-28 DIAGNOSIS — I480 Paroxysmal atrial fibrillation: Secondary | ICD-10-CM | POA: Diagnosis not present

## 2023-07-28 DIAGNOSIS — R0609 Other forms of dyspnea: Secondary | ICD-10-CM | POA: Diagnosis not present

## 2023-07-28 DIAGNOSIS — I10 Essential (primary) hypertension: Secondary | ICD-10-CM | POA: Diagnosis not present

## 2023-07-28 DIAGNOSIS — Z950 Presence of cardiac pacemaker: Secondary | ICD-10-CM | POA: Diagnosis not present

## 2023-07-28 DIAGNOSIS — N390 Urinary tract infection, site not specified: Secondary | ICD-10-CM | POA: Diagnosis not present

## 2023-07-28 DIAGNOSIS — E782 Mixed hyperlipidemia: Secondary | ICD-10-CM | POA: Diagnosis not present

## 2023-07-30 DIAGNOSIS — I10 Essential (primary) hypertension: Secondary | ICD-10-CM | POA: Diagnosis not present

## 2023-07-30 DIAGNOSIS — G4733 Obstructive sleep apnea (adult) (pediatric): Secondary | ICD-10-CM | POA: Diagnosis not present

## 2023-08-03 ENCOUNTER — Ambulatory Visit

## 2023-08-03 DIAGNOSIS — M5416 Radiculopathy, lumbar region: Secondary | ICD-10-CM | POA: Diagnosis not present

## 2023-08-03 DIAGNOSIS — E538 Deficiency of other specified B group vitamins: Secondary | ICD-10-CM | POA: Diagnosis not present

## 2023-08-03 NOTE — Therapy (Signed)
 OUTPATIENT PHYSICAL THERAPY THORACOLUMBAR TREATMENT   Patient Name: Alison Wilkinson MRN: [id number] DOB:1970/12/13, 53 y.o., female Today's Date: 08/03/2023  END OF SESSION:  PT End of Session - 08/03/23 0915     Visit Number 4    Number of Visits 8    Date for PT Re-Evaluation 08/26/23    Authorization Time Period 07/07/23-09/04/23    Authorization - Number of Visits 6    PT Start Time 0914    PT Stop Time 0956    PT Time Calculation (min) 42 min    Activity Tolerance Patient tolerated treatment well    Behavior During Therapy WFL for tasks assessed/performed               Past Medical History:  Diagnosis Date   A-fib (HCC)    Anxiety    Atrial tachycardia (HCC)    Cancer (HCC)    thyroid    Cardiomyopathy (HCC)    COPD (chronic obstructive pulmonary disease) (HCC)    Dyspnea    Dysrhythmia    GERD (gastroesophageal reflux disease)    Headache    High cholesterol    History of kidney stones    Hypertension    Hyperthyroidism    Hypothyroid    Mixed hyperlipidemia    Pacemaker    biotronic  PPM   [facility]    Pacemaker    Pneumonia    hx   PONV (postoperative nausea and vomiting)    Shortness of breath dyspnea    WITH EXERTION    Sleep apnea    mild case no cpap reccommended   SSS (sick sinus syndrome) (HCC)    Stroke (HCC)    2009   afib  (none in years)   Syncope    Past Surgical History:  Procedure Laterality Date   CARDIAC CATHETERIZATION     8/16   HYSTEROSCOPY  2015   MULTIPLE EXTRACTIONS WITH ALVEOLOPLASTY Bilateral 08/13/2016   Procedure: MULTIPLE EXTRACTIONS;  Surgeon: Ascencion Lava, DDS;  Location: [facility] OR;  Service: Oral Surgery;  Laterality: Bilateral;   PACEMAKER INSERTION  2008   THYROIDECTOMY Bilateral 04/25/2015   Procedure: THYROIDECTOMY;  Surgeon: Virgina Grills, MD;  Location: [facility] OR;  Service: ENT;  Laterality: Bilateral;  TOTAL THYROIDECTOMY   TUMOR REMOVAL  2013   UTERUS   Patient Active Problem List   Diagnosis  Date Noted   Acute left-sided weakness 07/02/2019   Left-sided weakness 07/01/2019   Paresthesia 02/01/2019   GERD (gastroesophageal reflux disease) 09/21/2018   Other fatigue 06/09/2017   Chronic atrial fibrillation (HCC) 06/09/2017   Other specified hypothyroidism 06/09/2017   Vitamin D  deficiency 06/09/2017   B12 nutritional deficiency 06/09/2017   Hyperglycemia 06/09/2017   Chronic obstructive pulmonary disease (HCC) 06/09/2017   Pacemaker 05/27/2016   Acute low back pain 04/09/2016   Solitary pulmonary nodule 02/05/2016   History of thyroid  cancer 01/08/2016   Tobacco abuse 05/12/2015   History of colonic polyps 05/12/2015   Hypocalcemia 04/28/2015   H/O total thyroidectomy 04/28/2015   Hypomagnesemia 04/28/2015   Epigastric abdominal pain 04/17/2015   Hyperthyroidism 11/12/2014   Nonischemic cardiomyopathy (HCC) 11/12/2014   Mixed hyperlipidemia 11/12/2014   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/11/2014   Sick sinus syndrome (HCC) 11/11/2014   Anxiety state 11/11/2014   History of CVA (cerebrovascular accident) 11/11/2014   Essential hypertension 05/28/2014   REFERRING PROVIDER: Samul Croft, Cherryl Corona, NP   REFERRING DIAG: Other spondylosis with radiculopathy, lumbar region; Other intervertebral disc degeneration, lumbar  region with discogenic back pain and lower extremity pain; Spondylosis without myelopathy or radiculopathy, lumbar region   Rationale for Evaluation and Treatment: Rehabilitation  THERAPY DIAG:  Radiculopathy, lumbar region  ONSET DATE: 1-2 years ago   SUBJECTIVE:                                                                                                                                                                                           SUBJECTIVE STATEMENT: Patient reports that she feels better today than she did at her last appointment.   PERTINENT HISTORY:  Hypertension, atrial fibrillation, COPD, anxiety, history of a CVA, and history of cancer  PAIN:   Are you having pain? Yes: NPRS scale: 8/10 Pain location: low back and both legs (R>L) Pain description: "heaviness", ache, throbbing, and shooting Aggravating factors: prolonged standing, sitting, or walking (about 10 minutes), any pressure on her legs, bending, putting her socks on Relieving factors: heat  PRECAUTIONS: Fall and ICD/Pacemaker  RED FLAGS: None   WEIGHT BEARING RESTRICTIONS: No  FALLS:  Has patient fallen in last 6 months? Yes. Number of falls 5; all five falls were in the past month, she is seeing a neurologist due to the recent increase in falling  LIVING ENVIRONMENT: Lives with: lives with their family Lives in: House/apartment Stairs: Yes: External: 2 steps; none; step to pattern while holding a pole for safety Has following equipment at home: Single point cane  OCCUPATION: disabled  PLOF: Independent  PATIENT GOALS: reduced pain, be able to help take care of her grandchildren  NEXT MD VISIT: 07/20/23  OBJECTIVE:  Note: Objective measures were completed at Evaluation unless otherwise noted.  PATIENT SURVEYS:  Modified Oswestry 40% disability   COGNITION: Overall cognitive status: Within functional limits for tasks assessed     SENSATION: Patient reports numbness in her right foot and lower leg. Absent sensation to touch in right gluteals  POSTURE: increased lumbar lordosis  PALPATION: TTP: bilateral lumbar paraspinals, right QL, gluteals, and piriformis  JOINT MOBILITY:  L1-3: WFL and nonpainful   L3-5: hypomobile and painful   LUMBAR ROM: all motions were limited by pain  AROM eval  Flexion 38  Extension 10; worst pain  Right lateral flexion 50% limited   Left lateral flexion 50% limited  Right rotation 75% limited  Left rotation 50% limited   (Blank rows = not tested)  LOWER EXTREMITY ROM: WFL for activities assessed  LOWER EXTREMITY MMT: unable to accurately assess due to pain irritability  MMT Right eval Left eval  Hip  flexion    Hip extension    Hip abduction    Hip  adduction    Hip internal rotation    Hip external rotation    Knee flexion    Knee extension    Ankle dorsiflexion    Ankle plantarflexion    Ankle inversion    Ankle eversion     (Blank rows = not tested)  GAIT: Assistive device utilized: None Level of assistance: Complete Independence Comments: decreased gait speed, stride length, and poor foot clearance bilaterally  TREATMENT DATE:                                                                                                                                                               08/03/23 EXERCISE LOG  Exercise Repetitions and Resistance Comments  Nustep  L4 x 17 minutes   Seated marching 3 x 8 reps each     Seated clams Yellow t-band x 3 x 12 reps    Shoulder bracing  2 x 12 reps    Resisted row  Red t-band x 3 x 8 reps    Shoulder ADD  Yellow t-band x 3 x 12 reps    Standing hip extension 3 x 8 reps each    Blank cell = exercise not performed today                                    07/27/23 EXERCISE LOG  Exercise Repetitions and Resistance Comments  Nustep  L3 x 15 minutes    Blank cell = exercise not performed today   07/13/23                                  EXERCISE LOG  Exercise Repetitions and Resistance Comments  Nustep Lvl 3 x 15 mins   Rockerboard    [employer] 1 mins   Ball Rollout         Blank cell = exercise not performed today   Manual Therapy Soft Tissue Mobilization: Low back, STW/M to bil lumbar paraspinals and Qls to decrease pain and tone.  Pt seated and leaning over elevated treatment table with pillow for comfort    PATIENT EDUCATION:  Education details:  Person educated: Patient Education method: Explanation Education comprehension: verbalized understanding  HOME EXERCISE PROGRAM:   ASSESSMENT:  CLINICAL IMPRESSION: Patient was introduced to multiple new interventions for improved postural reeducation. She required minimal  cueing with today's new interventions for proper exercise performance. She experienced no significant increase in pain or discomfort with any of today's interventions. She reported feeling alright upon the conclusion of treatment. She continues to require skilled physical therapy to address her remaining impairments to maximize her functional mobility.  OBJECTIVE IMPAIRMENTS: Abnormal gait, decreased activity tolerance, decreased balance, decreased mobility, difficulty walking, decreased ROM, hypomobility, impaired sensation, impaired tone, postural dysfunction, and pain.   ACTIVITY LIMITATIONS: carrying, lifting, bending, sitting, standing, stairs, transfers, locomotion level, and caring for others  PARTICIPATION LIMITATIONS: meal prep, cleaning, laundry, shopping, and community activity  PERSONAL FACTORS: Past/current experiences, Time since onset of injury/illness/exacerbation, and 3+ comorbidities: Hypertension, atrial fibrillation, COPD, anxiety, history of a CVA, and history of cancer  are also affecting patient's functional outcome.   REHAB POTENTIAL: Fair    CLINICAL DECISION MAKING: Unstable/unpredictable  EVALUATION COMPLEXITY: High   GOALS: Goals reviewed with patient? Yes  LONG TERM GOALS: Target date: 08/03/23  Patient will be independent with her HEP. Baseline:  Goal status: INITIAL  2.  Patient will be able to complete her daily activities without her familiar symptoms exceeded 8/10. Baseline:  Goal status: INITIAL  3.  Patient will improve her modified Oswestry disability index to 30% or less for improved perceived function with her daily activities. Baseline:  Goal status: INITIAL  4.  Patient will report being able to stand for at least 20 minutes without being limited by her familiar symptoms for improved function participating in activities with her grandchildren. Baseline:  Goal status: INITIAL  PLAN:  PT FREQUENCY: 2x/week  PT DURATION: 4  weeks  PLANNED INTERVENTIONS: 97164- PT Re-evaluation, 97110-Therapeutic exercises, 97530- Therapeutic activity, 97112- Neuromuscular re-education, 97535- Self Care, 82956- Manual therapy, [phone]- Gait training, Patient/Family education, Balance training, Stair training, Joint mobilization, and Spinal mobilization.  PLAN FOR NEXT SESSION: NuStep, isometrics, manual therapy, and provide HEP   Lane Pinon, PT 08/03/2023, 10:20 AM

## 2023-08-05 DIAGNOSIS — E890 Postprocedural hypothyroidism: Secondary | ICD-10-CM | POA: Diagnosis not present

## 2023-08-05 DIAGNOSIS — G4730 Sleep apnea, unspecified: Secondary | ICD-10-CM | POA: Diagnosis not present

## 2023-08-05 DIAGNOSIS — I495 Sick sinus syndrome: Secondary | ICD-10-CM | POA: Diagnosis not present

## 2023-08-05 DIAGNOSIS — I4719 Other supraventricular tachycardia: Secondary | ICD-10-CM | POA: Diagnosis not present

## 2023-08-05 DIAGNOSIS — J439 Emphysema, unspecified: Secondary | ICD-10-CM | POA: Diagnosis not present

## 2023-08-05 DIAGNOSIS — K219 Gastro-esophageal reflux disease without esophagitis: Secondary | ICD-10-CM | POA: Diagnosis not present

## 2023-08-05 DIAGNOSIS — Z950 Presence of cardiac pacemaker: Secondary | ICD-10-CM | POA: Diagnosis not present

## 2023-08-05 DIAGNOSIS — E782 Mixed hyperlipidemia: Secondary | ICD-10-CM | POA: Diagnosis not present

## 2023-08-05 DIAGNOSIS — I10 Essential (primary) hypertension: Secondary | ICD-10-CM | POA: Diagnosis not present

## 2023-08-05 DIAGNOSIS — I428 Other cardiomyopathies: Secondary | ICD-10-CM | POA: Diagnosis not present

## 2023-08-08 DIAGNOSIS — I10 Essential (primary) hypertension: Secondary | ICD-10-CM | POA: Diagnosis not present

## 2023-08-08 DIAGNOSIS — L601 Onycholysis: Secondary | ICD-10-CM | POA: Diagnosis not present

## 2023-08-08 DIAGNOSIS — G4733 Obstructive sleep apnea (adult) (pediatric): Secondary | ICD-10-CM | POA: Diagnosis not present

## 2023-08-08 DIAGNOSIS — L600 Ingrowing nail: Secondary | ICD-10-CM | POA: Diagnosis not present

## 2023-08-08 DIAGNOSIS — B351 Tinea unguium: Secondary | ICD-10-CM | POA: Diagnosis not present

## 2023-08-10 ENCOUNTER — Encounter

## 2023-08-24 DIAGNOSIS — E66813 Obesity, class 3: Secondary | ICD-10-CM | POA: Diagnosis not present

## 2023-08-24 DIAGNOSIS — Z6841 Body Mass Index (BMI) 40.0 and over, adult: Secondary | ICD-10-CM | POA: Diagnosis not present

## 2023-08-24 DIAGNOSIS — F439 Reaction to severe stress, unspecified: Secondary | ICD-10-CM | POA: Diagnosis not present

## 2023-08-24 DIAGNOSIS — Z713 Dietary counseling and surveillance: Secondary | ICD-10-CM | POA: Diagnosis not present

## 2023-08-30 DIAGNOSIS — I10 Essential (primary) hypertension: Secondary | ICD-10-CM | POA: Diagnosis not present

## 2023-08-30 DIAGNOSIS — G4733 Obstructive sleep apnea (adult) (pediatric): Secondary | ICD-10-CM | POA: Diagnosis not present

## 2023-09-02 DIAGNOSIS — R300 Dysuria: Secondary | ICD-10-CM | POA: Diagnosis not present

## 2023-09-02 DIAGNOSIS — L989 Disorder of the skin and subcutaneous tissue, unspecified: Secondary | ICD-10-CM | POA: Diagnosis not present

## 2023-09-05 DIAGNOSIS — R3989 Other symptoms and signs involving the genitourinary system: Secondary | ICD-10-CM | POA: Diagnosis not present

## 2023-09-05 DIAGNOSIS — N390 Urinary tract infection, site not specified: Secondary | ICD-10-CM | POA: Diagnosis not present

## 2023-09-05 DIAGNOSIS — N398 Other specified disorders of urinary system: Secondary | ICD-10-CM | POA: Diagnosis not present

## 2023-09-12 DIAGNOSIS — G4733 Obstructive sleep apnea (adult) (pediatric): Secondary | ICD-10-CM | POA: Diagnosis not present

## 2023-09-12 DIAGNOSIS — I10 Essential (primary) hypertension: Secondary | ICD-10-CM | POA: Diagnosis not present

## 2023-09-14 DIAGNOSIS — L578 Other skin changes due to chronic exposure to nonionizing radiation: Secondary | ICD-10-CM | POA: Diagnosis not present

## 2023-09-14 DIAGNOSIS — Z950 Presence of cardiac pacemaker: Secondary | ICD-10-CM | POA: Diagnosis not present

## 2023-09-14 DIAGNOSIS — I495 Sick sinus syndrome: Secondary | ICD-10-CM | POA: Diagnosis not present

## 2023-09-14 DIAGNOSIS — C44519 Basal cell carcinoma of skin of other part of trunk: Secondary | ICD-10-CM | POA: Diagnosis not present

## 2023-09-14 DIAGNOSIS — E538 Deficiency of other specified B group vitamins: Secondary | ICD-10-CM | POA: Diagnosis not present

## 2023-09-14 DIAGNOSIS — D225 Melanocytic nevi of trunk: Secondary | ICD-10-CM | POA: Diagnosis not present

## 2023-09-14 DIAGNOSIS — L821 Other seborrheic keratosis: Secondary | ICD-10-CM | POA: Diagnosis not present

## 2023-09-21 DIAGNOSIS — R6889 Other general symptoms and signs: Secondary | ICD-10-CM | POA: Diagnosis not present

## 2023-10-03 DIAGNOSIS — N958 Other specified menopausal and perimenopausal disorders: Secondary | ICD-10-CM | POA: Diagnosis not present

## 2023-10-03 DIAGNOSIS — R3989 Other symptoms and signs involving the genitourinary system: Secondary | ICD-10-CM | POA: Diagnosis not present

## 2023-10-03 DIAGNOSIS — N390 Urinary tract infection, site not specified: Secondary | ICD-10-CM | POA: Diagnosis not present

## 2023-10-03 DIAGNOSIS — N952 Postmenopausal atrophic vaginitis: Secondary | ICD-10-CM | POA: Diagnosis not present

## 2023-10-03 DIAGNOSIS — R3915 Urgency of urination: Secondary | ICD-10-CM | POA: Diagnosis not present

## 2023-10-03 DIAGNOSIS — R350 Frequency of micturition: Secondary | ICD-10-CM | POA: Diagnosis not present

## 2023-10-05 DIAGNOSIS — R419 Unspecified symptoms and signs involving cognitive functions and awareness: Secondary | ICD-10-CM | POA: Diagnosis not present

## 2023-10-05 DIAGNOSIS — C44319 Basal cell carcinoma of skin of other parts of face: Secondary | ICD-10-CM | POA: Diagnosis not present

## 2023-10-05 DIAGNOSIS — R413 Other amnesia: Secondary | ICD-10-CM | POA: Diagnosis not present

## 2023-10-13 DIAGNOSIS — G4733 Obstructive sleep apnea (adult) (pediatric): Secondary | ICD-10-CM | POA: Diagnosis not present

## 2023-10-13 DIAGNOSIS — I10 Essential (primary) hypertension: Secondary | ICD-10-CM | POA: Diagnosis not present

## 2023-10-13 DIAGNOSIS — C73 Malignant neoplasm of thyroid gland: Secondary | ICD-10-CM | POA: Diagnosis not present

## 2023-10-18 DIAGNOSIS — E538 Deficiency of other specified B group vitamins: Secondary | ICD-10-CM | POA: Diagnosis not present

## 2023-10-30 DIAGNOSIS — I10 Essential (primary) hypertension: Secondary | ICD-10-CM | POA: Diagnosis not present

## 2023-10-30 DIAGNOSIS — G4733 Obstructive sleep apnea (adult) (pediatric): Secondary | ICD-10-CM | POA: Diagnosis not present

## 2023-11-17 DIAGNOSIS — E538 Deficiency of other specified B group vitamins: Secondary | ICD-10-CM | POA: Diagnosis not present

## 2023-11-22 DIAGNOSIS — G4733 Obstructive sleep apnea (adult) (pediatric): Secondary | ICD-10-CM | POA: Diagnosis not present

## 2023-11-22 DIAGNOSIS — I10 Essential (primary) hypertension: Secondary | ICD-10-CM | POA: Diagnosis not present

## 2023-11-30 DIAGNOSIS — I10 Essential (primary) hypertension: Secondary | ICD-10-CM | POA: Diagnosis not present

## 2023-11-30 DIAGNOSIS — G4733 Obstructive sleep apnea (adult) (pediatric): Secondary | ICD-10-CM | POA: Diagnosis not present

## 2023-12-14 DIAGNOSIS — M47816 Spondylosis without myelopathy or radiculopathy, lumbar region: Secondary | ICD-10-CM | POA: Diagnosis not present

## 2023-12-15 DIAGNOSIS — E890 Postprocedural hypothyroidism: Secondary | ICD-10-CM | POA: Diagnosis not present

## 2023-12-20 DIAGNOSIS — E538 Deficiency of other specified B group vitamins: Secondary | ICD-10-CM | POA: Diagnosis not present

## 2024-01-09 DIAGNOSIS — M47816 Spondylosis without myelopathy or radiculopathy, lumbar region: Secondary | ICD-10-CM | POA: Diagnosis not present

## 2024-01-09 DIAGNOSIS — G894 Chronic pain syndrome: Secondary | ICD-10-CM | POA: Diagnosis not present

## 2024-01-09 DIAGNOSIS — Z1231 Encounter for screening mammogram for malignant neoplasm of breast: Secondary | ICD-10-CM | POA: Diagnosis not present

## 2024-01-09 DIAGNOSIS — I10 Essential (primary) hypertension: Secondary | ICD-10-CM | POA: Diagnosis not present

## 2024-01-09 DIAGNOSIS — M48062 Spinal stenosis, lumbar region with neurogenic claudication: Secondary | ICD-10-CM | POA: Diagnosis not present

## 2024-01-09 DIAGNOSIS — M4726 Other spondylosis with radiculopathy, lumbar region: Secondary | ICD-10-CM | POA: Diagnosis not present

## 2024-01-09 DIAGNOSIS — M48061 Spinal stenosis, lumbar region without neurogenic claudication: Secondary | ICD-10-CM | POA: Diagnosis not present

## 2024-01-09 DIAGNOSIS — M51362 Other intervertebral disc degeneration, lumbar region with discogenic back pain and lower extremity pain: Secondary | ICD-10-CM | POA: Diagnosis not present

## 2024-01-19 DIAGNOSIS — E538 Deficiency of other specified B group vitamins: Secondary | ICD-10-CM | POA: Diagnosis not present

## 2024-01-22 DIAGNOSIS — K0889 Other specified disorders of teeth and supporting structures: Secondary | ICD-10-CM | POA: Diagnosis not present

## 2024-01-23 DIAGNOSIS — R5383 Other fatigue: Secondary | ICD-10-CM | POA: Diagnosis not present

## 2024-01-23 DIAGNOSIS — K047 Periapical abscess without sinus: Secondary | ICD-10-CM | POA: Diagnosis not present

## 2024-01-23 DIAGNOSIS — R61 Generalized hyperhidrosis: Secondary | ICD-10-CM | POA: Diagnosis not present

## 2024-01-23 DIAGNOSIS — I10 Essential (primary) hypertension: Secondary | ICD-10-CM | POA: Diagnosis not present

## 2024-01-30 DIAGNOSIS — I10 Essential (primary) hypertension: Secondary | ICD-10-CM | POA: Diagnosis not present

## 2024-01-30 DIAGNOSIS — M48061 Spinal stenosis, lumbar region without neurogenic claudication: Secondary | ICD-10-CM | POA: Diagnosis not present

## 2024-01-30 DIAGNOSIS — M4726 Other spondylosis with radiculopathy, lumbar region: Secondary | ICD-10-CM | POA: Diagnosis not present

## 2024-01-30 DIAGNOSIS — R202 Paresthesia of skin: Secondary | ICD-10-CM | POA: Diagnosis not present

## 2024-01-30 DIAGNOSIS — R200 Anesthesia of skin: Secondary | ICD-10-CM | POA: Diagnosis not present

## 2024-02-10 DIAGNOSIS — I428 Other cardiomyopathies: Secondary | ICD-10-CM | POA: Diagnosis not present

## 2024-02-15 DIAGNOSIS — I10 Essential (primary) hypertension: Secondary | ICD-10-CM | POA: Diagnosis not present

## 2024-02-15 DIAGNOSIS — G4733 Obstructive sleep apnea (adult) (pediatric): Secondary | ICD-10-CM | POA: Diagnosis not present

## 2024-02-21 DIAGNOSIS — E538 Deficiency of other specified B group vitamins: Secondary | ICD-10-CM | POA: Diagnosis not present

## 2024-02-29 DIAGNOSIS — C44319 Basal cell carcinoma of skin of other parts of face: Secondary | ICD-10-CM | POA: Diagnosis not present

## 2024-02-29 DIAGNOSIS — L578 Other skin changes due to chronic exposure to nonionizing radiation: Secondary | ICD-10-CM | POA: Diagnosis not present

## 2024-02-29 DIAGNOSIS — L219 Seborrheic dermatitis, unspecified: Secondary | ICD-10-CM | POA: Diagnosis not present

## 2024-03-12 DIAGNOSIS — I495 Sick sinus syndrome: Secondary | ICD-10-CM | POA: Diagnosis not present

## 2024-03-12 DIAGNOSIS — Z950 Presence of cardiac pacemaker: Secondary | ICD-10-CM | POA: Diagnosis not present

## 2024-03-20 DIAGNOSIS — E538 Deficiency of other specified B group vitamins: Secondary | ICD-10-CM | POA: Diagnosis not present
